# Patient Record
Sex: Male | Born: 1945 | Race: Black or African American | Hispanic: No | Marital: Married | Smoking: Never smoker
Health system: Southern US, Community
[De-identification: ages and names within clinical notes are randomized; demographics above are authoritative.]

## PROBLEM LIST (undated history)

## (undated) DIAGNOSIS — I10 Essential (primary) hypertension: Secondary | ICD-10-CM

## (undated) DIAGNOSIS — G4730 Sleep apnea, unspecified: Secondary | ICD-10-CM

## (undated) DIAGNOSIS — N429 Disorder of prostate, unspecified: Secondary | ICD-10-CM

## (undated) DIAGNOSIS — N200 Calculus of kidney: Secondary | ICD-10-CM

## (undated) DIAGNOSIS — K219 Gastro-esophageal reflux disease without esophagitis: Secondary | ICD-10-CM

## (undated) DIAGNOSIS — I429 Cardiomyopathy, unspecified: Secondary | ICD-10-CM

## (undated) DIAGNOSIS — Z87442 Personal history of urinary calculi: Secondary | ICD-10-CM

## (undated) DIAGNOSIS — R0600 Dyspnea, unspecified: Secondary | ICD-10-CM

## (undated) DIAGNOSIS — N189 Chronic kidney disease, unspecified: Secondary | ICD-10-CM

## (undated) DIAGNOSIS — N400 Enlarged prostate without lower urinary tract symptoms: Secondary | ICD-10-CM

## (undated) DIAGNOSIS — M1990 Unspecified osteoarthritis, unspecified site: Secondary | ICD-10-CM

## (undated) HISTORY — PX: CARDIAC CATHETERIZATION: SHX172

## (undated) HISTORY — PX: KIDNEY STONE SURGERY: SHX686

## (undated) HISTORY — PX: TONSILLECTOMY: SUR1361

## (undated) HISTORY — PX: COLONOSCOPY: SHX174

## (undated) HISTORY — DX: Essential (primary) hypertension: I10

## (undated) HISTORY — DX: Gastro-esophageal reflux disease without esophagitis: K21.9

## (undated) HISTORY — DX: Calculus of kidney: N20.0

## (undated) HISTORY — DX: Sleep apnea, unspecified: G47.30

## (undated) HISTORY — DX: Disorder of prostate, unspecified: N42.9

## (undated) HISTORY — DX: Cardiomyopathy, unspecified: I42.9

---

## 2015-06-10 ENCOUNTER — Encounter (HOSPITAL_COMMUNITY): Payer: Self-pay | Admitting: Physical Medicine and Rehabilitation

## 2015-06-10 ENCOUNTER — Emergency Department (HOSPITAL_COMMUNITY)
Admission: EM | Admit: 2015-06-10 | Discharge: 2015-06-10 | Disposition: A | Discharge status: Home / Self Care | Payer: Medicare Other | Attending: Emergency Medicine | Admitting: Emergency Medicine

## 2015-06-10 ENCOUNTER — Emergency Department (HOSPITAL_COMMUNITY): Payer: Medicare Other

## 2015-06-10 DIAGNOSIS — R7989 Other specified abnormal findings of blood chemistry: Secondary | ICD-10-CM | POA: Insufficient documentation

## 2015-06-10 DIAGNOSIS — Z87442 Personal history of urinary calculi: Secondary | ICD-10-CM | POA: Insufficient documentation

## 2015-06-10 DIAGNOSIS — I10 Essential (primary) hypertension: Secondary | ICD-10-CM | POA: Insufficient documentation

## 2015-06-10 DIAGNOSIS — Z79899 Other long term (current) drug therapy: Secondary | ICD-10-CM | POA: Diagnosis not present

## 2015-06-10 DIAGNOSIS — R945 Abnormal results of liver function studies: Secondary | ICD-10-CM

## 2015-06-10 DIAGNOSIS — R1030 Lower abdominal pain, unspecified: Secondary | ICD-10-CM | POA: Diagnosis present

## 2015-06-10 HISTORY — DX: Cardiomyopathy, unspecified: I42.9

## 2015-06-10 HISTORY — DX: Essential (primary) hypertension: I10

## 2015-06-10 LAB — COMPREHENSIVE METABOLIC PANEL
ALT: 178 U/L — AB (ref 17–63)
AST: 73 U/L — ABNORMAL HIGH (ref 15–41)
Albumin: 3.3 g/dL — ABNORMAL LOW (ref 3.5–5.0)
Alkaline Phosphatase: 46 U/L (ref 38–126)
Anion gap: 9 (ref 5–15)
BUN: 23 mg/dL — ABNORMAL HIGH (ref 6–20)
CALCIUM: 9.2 mg/dL (ref 8.9–10.3)
CO2: 21 mmol/L — ABNORMAL LOW (ref 22–32)
CREATININE: 1.83 mg/dL — AB (ref 0.61–1.24)
Chloride: 105 mmol/L (ref 101–111)
GFR calc non Af Amer: 36 mL/min — ABNORMAL LOW (ref >60)
GFR, EST AFRICAN AMERICAN: 42 mL/min — AB (ref >60)
Glucose, Bld: 91 mg/dL (ref 65–99)
Potassium: 4.8 mmol/L (ref 3.5–5.1)
SODIUM: 135 mmol/L (ref 135–145)
Total Bilirubin: 0.6 mg/dL (ref 0.3–1.2)
Total Protein: 7.3 g/dL (ref 6.5–8.1)

## 2015-06-10 LAB — URINALYSIS, ROUTINE W REFLEX MICROSCOPIC
Bilirubin Urine: NEGATIVE
Glucose, UA: NEGATIVE mg/dL
HGB URINE DIPSTICK: NEGATIVE
KETONES UR: NEGATIVE mg/dL
Leukocytes, UA: NEGATIVE
NITRITE: NEGATIVE
PH: 5.5 (ref 5.0–8.0)
PROTEIN: NEGATIVE mg/dL
Specific Gravity, Urine: 1.016 (ref 1.005–1.030)
Urobilinogen, UA: 0.2 mg/dL (ref 0.0–1.0)

## 2015-06-10 LAB — CBC WITH DIFFERENTIAL/PLATELET
BASOS PCT: 1 % (ref 0–1)
Basophils Absolute: 0 10*3/uL (ref 0.0–0.1)
Eosinophils Absolute: 0.1 10*3/uL (ref 0.0–0.7)
Eosinophils Relative: 2 % (ref 0–5)
HCT: 40 % (ref 39.0–52.0)
HEMOGLOBIN: 14.1 g/dL (ref 13.0–17.0)
Lymphocytes Relative: 22 % (ref 12–46)
Lymphs Abs: 1.6 10*3/uL (ref 0.7–4.0)
MCH: 31 pg (ref 26.0–34.0)
MCHC: 35.3 g/dL (ref 30.0–36.0)
MCV: 87.9 fL (ref 78.0–100.0)
Monocytes Absolute: 0.6 10*3/uL (ref 0.1–1.0)
Monocytes Relative: 8 % (ref 3–12)
NEUTROS PCT: 67 % (ref 43–77)
Neutro Abs: 5 10*3/uL (ref 1.7–7.7)
PLATELETS: 288 10*3/uL (ref 150–400)
RBC: 4.55 MIL/uL (ref 4.22–5.81)
RDW: 14 % (ref 11.5–15.5)
WBC: 7.4 10*3/uL (ref 4.0–10.5)

## 2015-06-10 LAB — POC OCCULT BLOOD, ED: Fecal Occult Bld: NEGATIVE

## 2015-06-10 IMAGING — CT CT ABD-PELV W/O CM
2 of 4 series · 16 of 46 positions shown, 18 images · non-contrast
Comparison: None.

CLINICAL DATA: Right flank pain starting [REDACTED]

EXAM:
CT ABDOMEN AND PELVIS WITHOUT CONTRAST
TECHNIQUE: Multidetector CT imaging of the abdomen and pelvis was performed
following the standard protocol without IV contrast.

[Series 2: abd/ pelvis 5.0 i30f 1 · axial · 0.73mm/px · z∈[+795,+1220]mm · 13 of 95 slices shown, 15 images]
[im 5/95  soft-tissue]
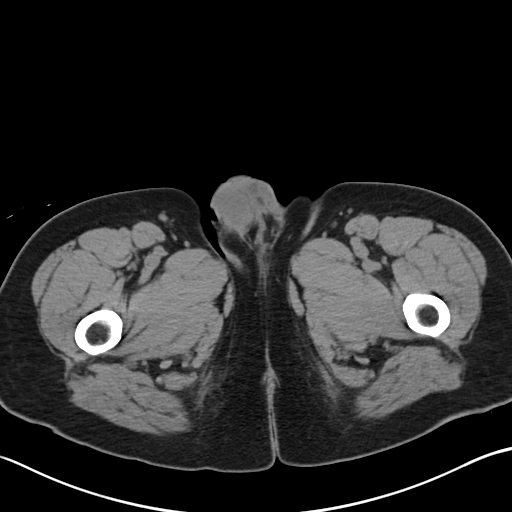
[im 5/95  bone]
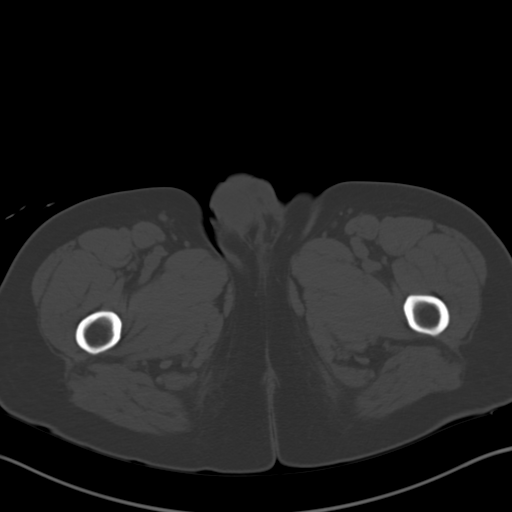
[im 13/95  soft-tissue]
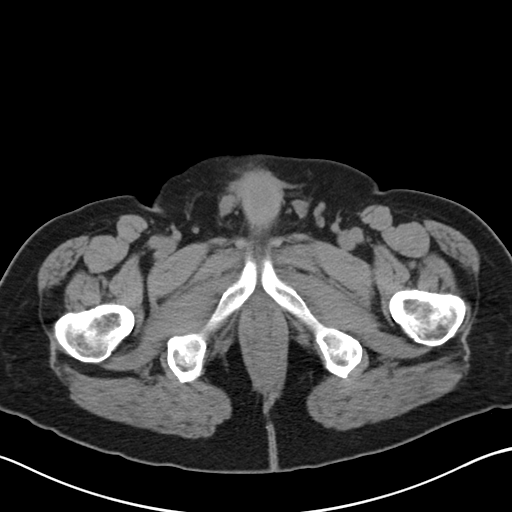
[im 21/95  soft-tissue]
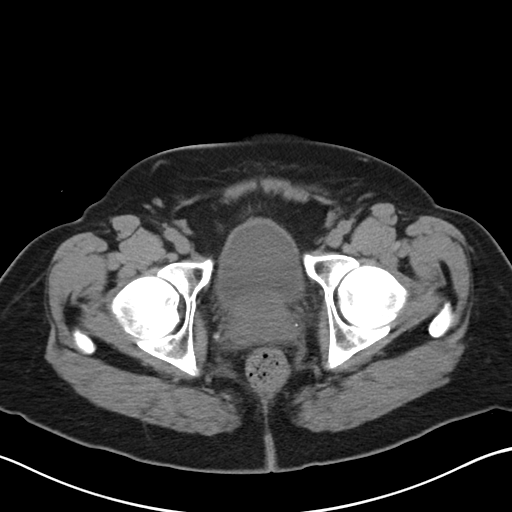
[im 25/95  soft-tissue]
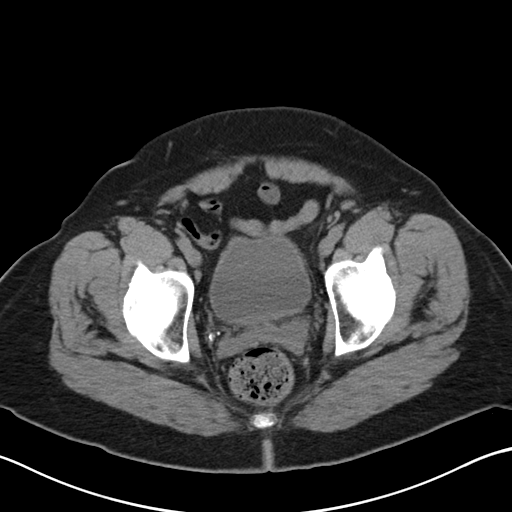
[im 33/95  soft-tissue]
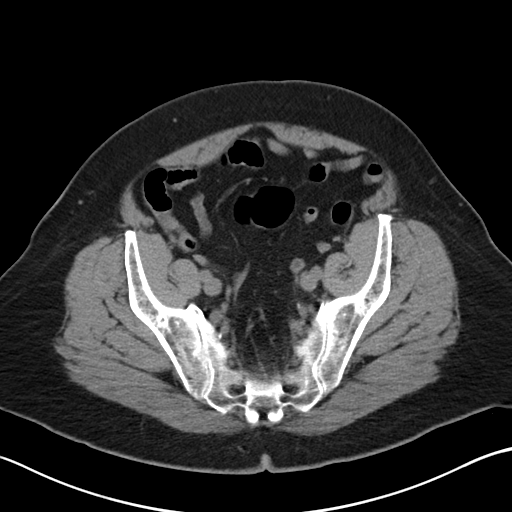
[im 41/95  soft-tissue]
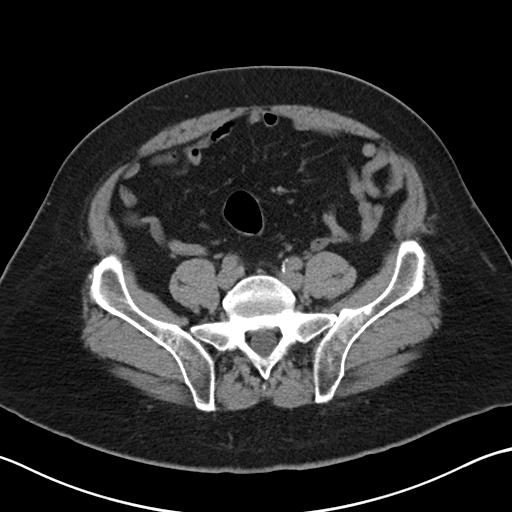
[im 50/95  soft-tissue]
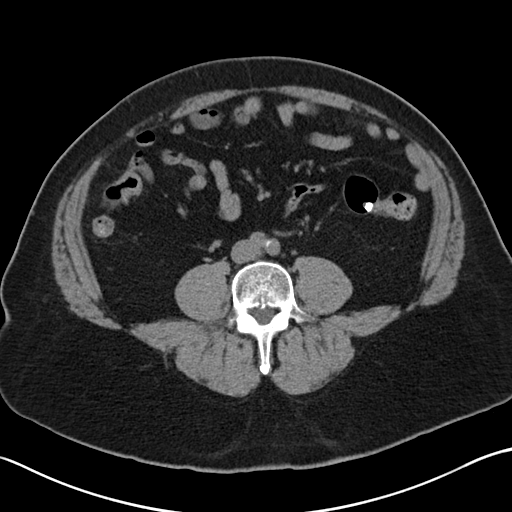
[im 54/95  soft-tissue]
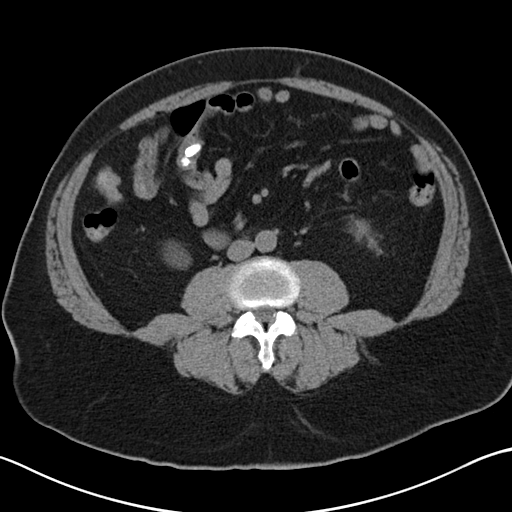
[im 62/95  soft-tissue]
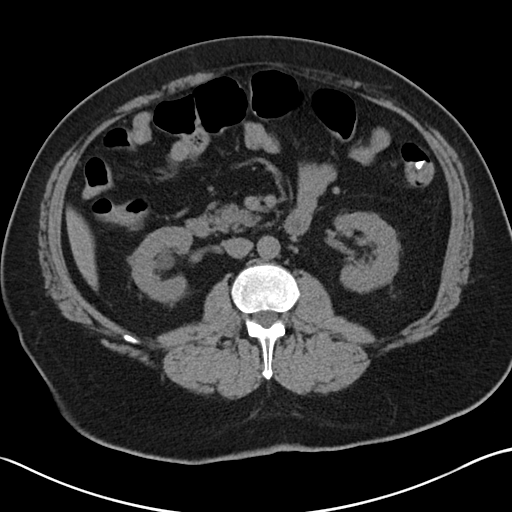
[im 62/95  bone]
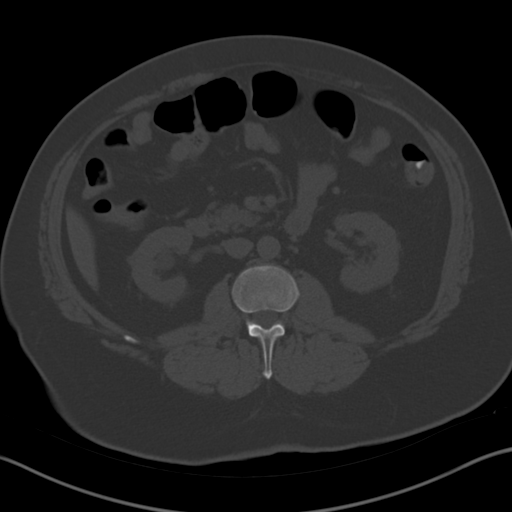
[im 70/95  soft-tissue]
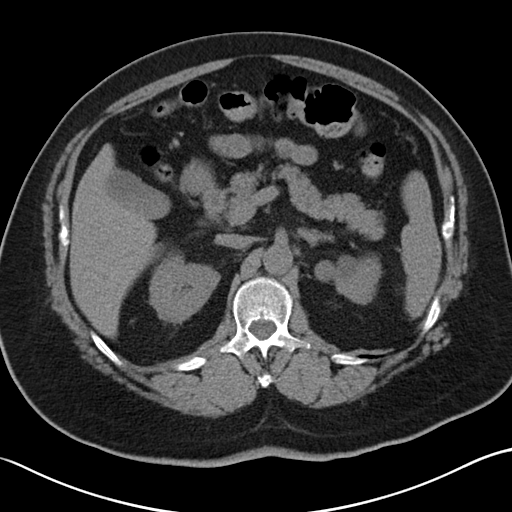
[im 74/95  soft-tissue]
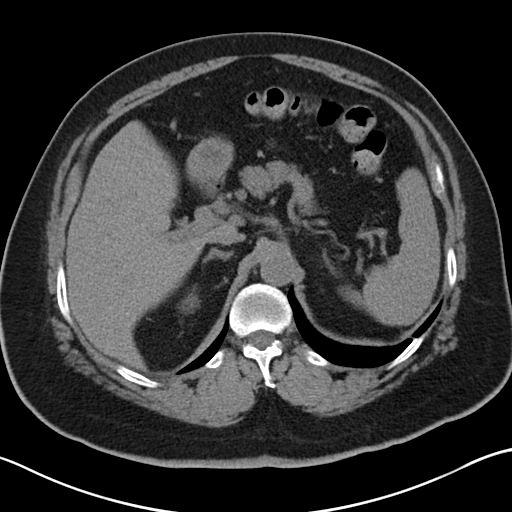
[im 82/95  soft-tissue]
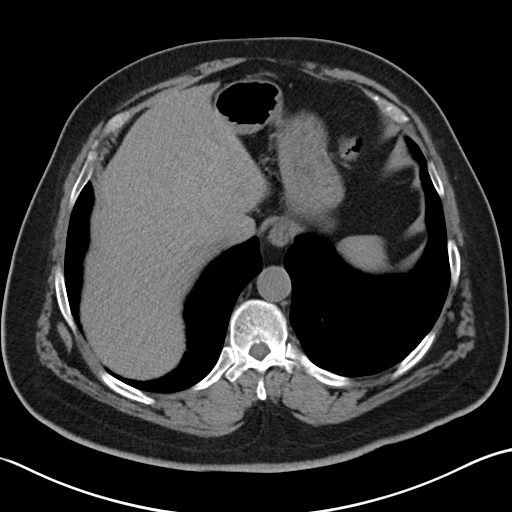
[im 90/95  soft-tissue]
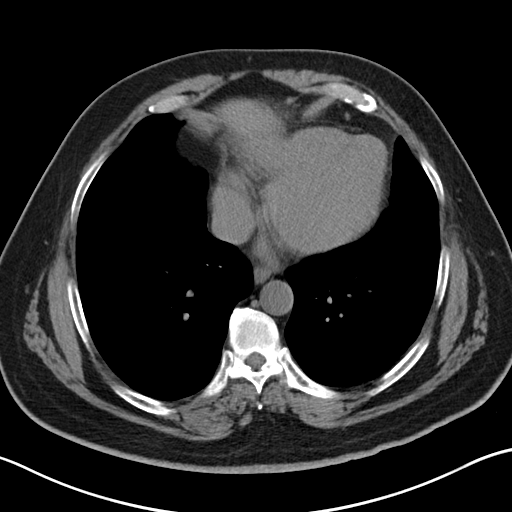

[Series 5: coronal soft tissue · coronal · 0.79mm/px · 3 of 101 slices shown]
[im 34/101  soft-tissue]
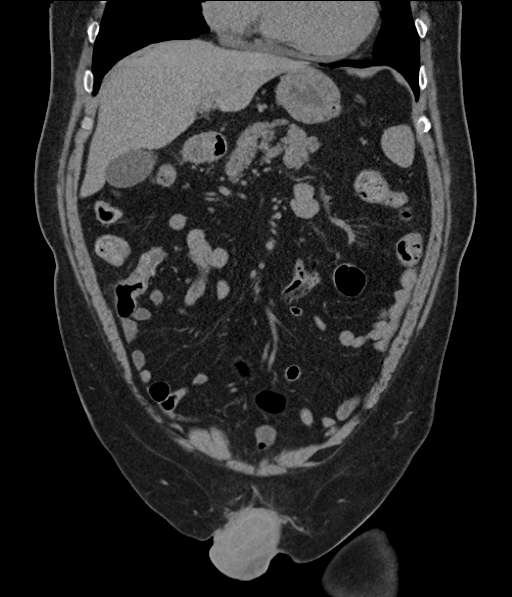
[im 45/101  soft-tissue]
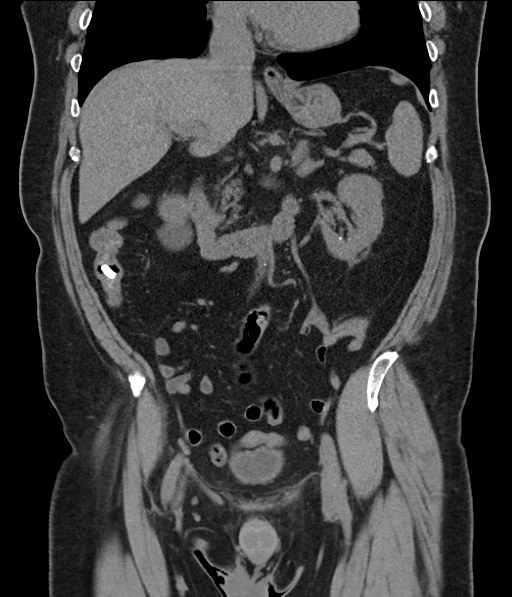
[im 56/101  soft-tissue]
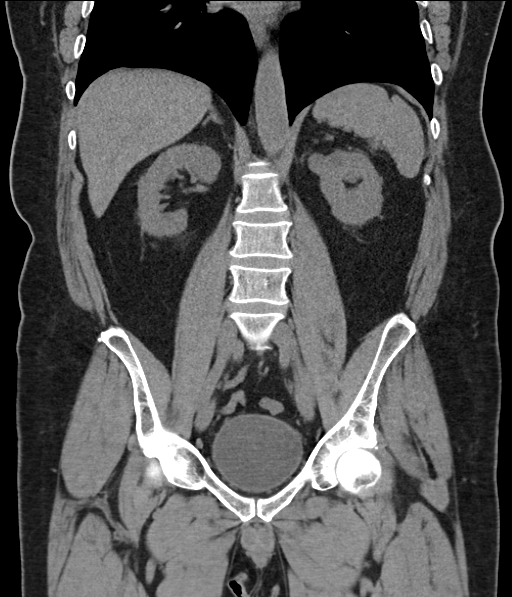

[16 of 46 positions shown; findings below may reference images not displayed]

FINDINGS: Lung bases are unremarkable. Sagittal images of the spine are
unremarkable. Unenhanced liver shows no biliary ductal dilatation.
No calcified gallstones are noted within gallbladder.

Unenhanced pancreas, spleen and adrenal glands are unremarkable.
Cyst in upper pole of the left kidney measures 1.3 cm. Second cyst
in upper pole of the left kidney measures 1.2 cm.

No hydronephrosis or hydroureter. Cyst in lower pole of the right
kidney measures 3.8 cm. Exophytic cyst in upper pole of the left
kidney measures 1.3 cm. Second cyst in upper pole of the left kidney
measures 1.2 cm. No calcified ureteral calculi are noted
bilaterally. No aortic aneurysm. Atherosclerotic calcifications
bilateral common iliac arteries.

Pelvic phleboliths are noted. Mild enlarged prostate gland with
indentation of urinary bladder base. Prostate gland measures 3.7 by
4.3 cm. Pelvic phleboliths are noted. Moderate stool noted within
rectum.

No small bowel obstruction. No ascites or free air. No adenopathy.
There is no pericecal inflammation. The appendix is not identified.
Moderate gas noted within mid sigmoid colon.
IMPRESSION: 1. Bilateral renal cysts are noted. Left nonobstructive
nephrolithiasis.
2. No calcified ureteral calculi are noted bilaterally.
3. There is no pericecal inflammation. The appendix is not
identified.
4. No small bowel obstruction.
5. Mild enlarged prostate gland with indentation of urinary bladder
base.

## 2015-06-10 NOTE — Discharge Instructions (Signed)
Serum Creatinine Test A creatinine test measures the amount of creatinine in your blood serum. Creatinine is a waste product of normal muscle activity (contraction). The kidneys filter creatinine from your blood and excrete it from your body in urine. Blood creatinine levels stay consistent in people whose muscle mass stays consistent. However, this level can be increased in people who perform resistance exercise to increase muscle mass.  The amount of creatinine in your blood serum is measured by a laboratory instrument. A sample of your blood is taken. An instrument causes chemical reactions to produce a color change in your blood sample. The degree of color change is also measured by the instrument. The degree of color change is directly related to the amount of creatinine in the sample. The more creatinine there is in your blood, the deeper the color becomes.  RESULTS It is your responsibility to obtain your test results. Ask the lab or department performing the test when and how you will get your results. Contact your caregiver to discuss any questions you have about your results. Range of Normal Values Ranges for normal values may vary among different labs and hospitals. You should always check with your doctor after having lab work or other tests done to discuss the meaning of your test results and whether your values are considered within normal limits. After adolescence, normal values of serum creatinine are different between females and males because adult males typically have more muscle mass than adult females. The range of values for serum creatinine are listed as follows:  Adult:  Male--0.5-1.1 mg/dL or 78-2 micromole/L (SI units)  Male--0.6-1.2 mg/dL or 95-621 micromole/L (SI units)  Adolescent--0.5-1.0 mg/dL or 44 micromole/L (SI units)  Child--0.3-0.7 mg/dL or 30-86 micromole/L (SI units)  Infant--0.2-0.4 mg/dL or 57-84 micromole/L (SI units)  Newborn--0.3-1.2 mg/dL or 69-629  micromole/L (SI units) The range of normal values may be higher in people who perform resistance exercise to increase muscle mass.  Meaning of Results Outside of Normal Value Ranges Abnormally high serum creatinine levels are most commonly seen in renal failure. When your kidneys are not working, the creatinine is not removed. The levels of creatinine then build up in your blood. Abnormally high serum creatinine levels also can be seen with dehydration, overactive thyroid glands (hyperthyroidism), conditions related to overgrowth of the body (acromegaly or gigantism), abnormal breakdown of muscle tissue (rhabdomyolysis), and early muscular dystrophy. Abnormally low serum creatinine levels may indicate low muscle mass associated with malnutrition, lack of activity, increasing age, or late-stage muscular dystrophy. Abnormally low creatinine values also are seen during the first 6 months of pregnancy. Document Released: 11/10/2004 Document Revised: 10/04/2012 Document Reviewed: 09/04/2012 [facility] Patient Information 2015 [place], [place]. This information is not intended to replace advice given to you by your health care provider. Make sure you discuss any questions you have with your health care provider.

## 2015-06-10 NOTE — ED Notes (Signed)
Pt presents to department for evaluation of R sided groin pain. Ongoing for several days. Also states decreased urine output and hematuria. Pt is alert and oriented x4. States kidney stones in the past.

## 2015-06-10 NOTE — ED Notes (Signed)
Called at triage for room x3. No answer 

## 2015-06-10 NOTE — ED Notes (Signed)
Pt verbalizes understanding of discharge instructions. NAD on departure. A/O x4. VSS. Ambulatory with steady gait.

## 2015-06-10 NOTE — ED Provider Notes (Signed)
CSN: [id number]     Arrival date & time 06/10/15  1257 History   First MD Initiated Contact with Patient 06/10/15 1456     Chief Complaint  Patient presents with  . Groin Pain     (Consider location/radiation/quality/duration/timing/severity/associated sxs/prior Treatment) HPI    PCP: No primary care provider on file. Blood pressure 134/81, pulse 74, temperature 98.1 F (36.7 C), temperature source Oral, resp. rate 16, SpO2 99 %.  Jalani Rominger is a 69 y.o.male with a significant PMH of hypertension, kidney stones, cardiomyopathy presents to the ER with complaints of right sided groin and dar stool.  He has a history of kidney stones x 4 in the past and this feels the same. The pain is right flank and radiates down into his groin. He reports hematuria and decreased urine output for several days now. His wife has been giving him some of her Amoxicillin for 3 days for his symptoms as well as Ibuprofen. The patients primary care doctor is in [place] and he is in [facility]. They are due to travel to [facility] soon and came to the ER to make sure he is okay before they leave.   The patient denies diaphoresis, fever, headache, weakness (general or focal), confusion, change of vision,  neck pain, dysphagia, aphagia, chest pain, shortness of breath,  back pain, nausea, vomiting, diarrhea, lower extremity swelling, rash.   Past Medical History  Diagnosis Date  . Hypertension   . Cardiomyopathy    History reviewed. No pertinent past surgical history. No family history on file. History  Substance Use Topics  . Smoking status: Never Smoker   . Smokeless tobacco: Not on file  . Alcohol Use: No    Review of Systems  10 Systems reviewed and are negative for acute change except as noted in the HPI.   Allergies  Review of patient's allergies indicates no known allergies.  Home Medications   Prior to Admission medications   Medication Sig Start Date End Date Taking?  Authorizing Provider  EDARBI 80 MG TABS Take 1 tablet by mouth every morning. 05/24/15   Historical Provider, MD  NIFEdipine (PROCARDIA-XL/ADALAT CC) 30 MG 24 hr tablet Take 30 mg by mouth daily. 04/15/15   Historical Provider, MD  RANEXA 1000 MG SR tablet TAKE 1 TABLET BY MOUTH TWICE A DAY AFTER MEALS 03/18/15   Historical Provider, MD   BP 132/59 mmHg  Pulse 66  Temp(Src) 98.1 F (36.7 C) (Oral)  Resp 16  SpO2 100% Physical Exam  Constitutional: He appears well-developed and well-nourished. No distress.  HENT:  Head: Normocephalic and atraumatic.  Eyes: Pupils are equal, round, and reactive to light.  Neck: Normal range of motion. Neck supple.  Cardiovascular: Normal rate and regular rhythm.   Pulmonary/Chest: Effort normal.  Abdominal: Soft. Bowel sounds are normal. He exhibits no distension. There is tenderness (mild tenderness to right flank). There is CVA tenderness (right). There is no rigidity, no rebound and no guarding.  Genitourinary:  No testicular pain or swelling.  Prostate does not feel enlarged  No gross blood in rectal vault Small amount of dark stool noted.  Neurological: He is alert.  Skin: Skin is warm and dry.  Nursing note and vitals reviewed.   ED Course  Procedures (including critical care time) Labs Review Labs Reviewed  COMPREHENSIVE METABOLIC PANEL - Abnormal; Notable for the following:    CO2 21 (*)    BUN 23 (*)    Creatinine, Ser 1.83 (*)  Albumin 3.3 (*)    AST 73 (*)    ALT 178 (*)    GFR calc non Af Amer 36 (*)    GFR calc Af Amer 42 (*)    All other components within normal limits  URINALYSIS, ROUTINE W REFLEX MICROSCOPIC (NOT AT [facility])  CBC WITH DIFFERENTIAL/PLATELET  OCCULT BLOOD X 1 CARD TO LAB, STOOL  POC OCCULT BLOOD, ED    Imaging Review Ct Abdomen Pelvis Wo Contrast  06/10/2015   CLINICAL DATA:  Right flank pain starting Wednesday  EXAM: CT ABDOMEN AND PELVIS WITHOUT CONTRAST  TECHNIQUE: Multidetector CT imaging of the abdomen  and pelvis was performed following the standard protocol without IV contrast.  COMPARISON:  None.  FINDINGS: Lung bases are unremarkable. Sagittal images of the spine are unremarkable. Unenhanced liver shows no biliary ductal dilatation. No calcified gallstones are noted within gallbladder.  Unenhanced pancreas, spleen and adrenal glands are unremarkable. Cyst in upper pole of the left kidney measures 1.3 cm. Second cyst in upper pole of the left kidney measures 1.2 cm.  No hydronephrosis or hydroureter. Cyst in lower pole of the right kidney measures 3.8 cm. Exophytic cyst in upper pole of the left kidney measures 1.3 cm. Second cyst in upper pole of the left kidney measures 1.2 cm. No calcified ureteral calculi are noted bilaterally. No aortic aneurysm. Atherosclerotic calcifications bilateral common iliac arteries.  Pelvic phleboliths are noted. Mild enlarged prostate gland with indentation of urinary bladder base. Prostate gland measures 3.7 by 4.3 cm. Pelvic phleboliths are noted. Moderate stool noted within rectum.  No small bowel obstruction. No ascites or free air. No adenopathy. There is no pericecal inflammation. The appendix is not identified. Moderate gas noted within mid sigmoid colon.  IMPRESSION: 1. Bilateral renal cysts are noted. Left nonobstructive nephrolithiasis. 2. No calcified ureteral calculi are noted bilaterally. 3. There is no pericecal inflammation. The appendix is not identified. 4. No small bowel obstruction. 5. Mild enlarged prostate gland with indentation of urinary bladder base.   Electronically Signed   By: Natasha Mead M.D.   On: 06/10/2015 17:27     EKG Interpretation None      MDM   Final diagnoses:  Elevated serum creatinine  Elevated LFTs    Patients liver and kidney function are abnormal but he is from out of town and unsure if this is baseline or not. His wife believes his creatinine is usually 1.8. His CT scan is reassuring and his CBC, urinalysis and hemoccult  are all normal. Dr. Manus Gunning has seen patient as well. We will instruct the patient to be seen within the next few days to have his labs rechecked and to be compared to baseline by his PCP. At this time his discomfort is intermittent and mild- he has not had pain during his stay today and declined pain medication during the visit.  Patient safe for discharge at this time. - bladder scanner showed no urine within bladder.  Medications - No data to display  69 y.o.Desmond Dike evaluation in the Emergency Department is complete. It has been determined that no acute conditions requiring further emergency intervention are present at this time. The patient/guardian have been advised of the diagnosis and plan. We have discussed signs and symptoms that warrant return to the ED, such as changes or worsening in symptoms.  Vital signs are stable at discharge. Filed Vitals:   06/10/15 1630  BP: 132/59  Pulse: 66  Temp:   Resp:  Patient/guardian has voiced understanding and agreed to follow-up with the PCP or specialist.    Marlon Pel, PA-C 06/10/15 1815  Marlon Pel, PA-C 06/10/15 1844  Glynn Octave, MD 06/11/15 [phone]

## 2015-06-10 NOTE — ED Notes (Signed)
PA at bedside.

## 2015-06-10 NOTE — ED Notes (Signed)
Pt transporting to CT NAD.  

## 2016-04-28 ENCOUNTER — Ambulatory Visit: Payer: Medicare Other | Admitting: Anesthesiology

## 2016-04-28 ENCOUNTER — Ambulatory Visit: Payer: Medicare Other | Admitting: Gastroenterology

## 2016-04-28 ENCOUNTER — Ambulatory Visit: Payer: Self-pay

## 2016-04-28 ENCOUNTER — Ambulatory Visit
Admission: RE | Admit: 2016-04-28 | Discharge: 2016-04-28 | Disposition: A | Discharge status: Home / Self Care | Payer: Medicare Other | Source: Ambulatory Visit | Attending: Gastroenterology | Admitting: Gastroenterology

## 2016-04-28 ENCOUNTER — Encounter
Admission: RE | Disposition: A | Discharge status: Home / Self Care | Payer: Self-pay | Source: Ambulatory Visit | Attending: Gastroenterology

## 2016-04-28 DIAGNOSIS — D125 Benign neoplasm of sigmoid colon: Secondary | ICD-10-CM

## 2016-04-28 DIAGNOSIS — K621 Rectal polyp: Secondary | ICD-10-CM

## 2016-04-28 DIAGNOSIS — K635 Polyp of colon: Secondary | ICD-10-CM | POA: Insufficient documentation

## 2016-04-28 DIAGNOSIS — K529 Noninfective gastroenteritis and colitis, unspecified: Secondary | ICD-10-CM | POA: Insufficient documentation

## 2016-04-28 DIAGNOSIS — R938 Abnormal findings on diagnostic imaging of other specified body structures: Secondary | ICD-10-CM | POA: Insufficient documentation

## 2016-04-28 DIAGNOSIS — G4730 Sleep apnea, unspecified: Secondary | ICD-10-CM | POA: Insufficient documentation

## 2016-04-28 DIAGNOSIS — D123 Benign neoplasm of transverse colon: Secondary | ICD-10-CM | POA: Insufficient documentation

## 2016-04-28 DIAGNOSIS — I10 Essential (primary) hypertension: Secondary | ICD-10-CM | POA: Insufficient documentation

## 2016-04-28 DIAGNOSIS — I428 Other cardiomyopathies: Secondary | ICD-10-CM | POA: Insufficient documentation

## 2016-04-28 DIAGNOSIS — K648 Other hemorrhoids: Secondary | ICD-10-CM | POA: Insufficient documentation

## 2016-04-28 DIAGNOSIS — Z86010 Personal history of colonic polyps: Secondary | ICD-10-CM | POA: Insufficient documentation

## 2016-04-28 DIAGNOSIS — K219 Gastro-esophageal reflux disease without esophagitis: Secondary | ICD-10-CM | POA: Insufficient documentation

## 2016-04-28 DIAGNOSIS — D122 Benign neoplasm of ascending colon: Secondary | ICD-10-CM

## 2016-04-28 DIAGNOSIS — R194 Change in bowel habit: Secondary | ICD-10-CM

## 2016-04-28 DIAGNOSIS — K5791 Diverticulosis of intestine, part unspecified, without perforation or abscess with bleeding: Secondary | ICD-10-CM

## 2016-04-28 DIAGNOSIS — K5730 Diverticulosis of large intestine without perforation or abscess without bleeding: Secondary | ICD-10-CM | POA: Insufficient documentation

## 2016-04-28 DIAGNOSIS — D124 Benign neoplasm of descending colon: Secondary | ICD-10-CM

## 2016-04-28 SURGERY — DONT USE, USE 1094-COLONOSCOPY, DIAGNOSTIC (SCREENING)
Anesthesia: Anesthesia General | Site: Abdomen | Wound class: Clean Contaminated

## 2016-04-28 MED ORDER — PROPOFOL INFUSION 10 MG/ML
INTRAVENOUS | Status: DC | PRN
Start: 2016-04-28 — End: 2016-04-28
  Administered 2016-04-28: 50 mg via INTRAVENOUS
  Administered 2016-04-28: 80 mg via INTRAVENOUS
  Administered 2016-04-28: 20 mg via INTRAVENOUS

## 2016-04-28 MED ORDER — LACTATED RINGERS IV SOLN
INTRAVENOUS | Status: DC | PRN
Start: 2016-04-28 — End: 2016-04-28

## 2016-04-28 MED ORDER — LIDOCAINE HCL (PF) 2 % IJ SOLN
INTRAMUSCULAR | Status: AC
Start: 2016-04-28 — End: ?
  Filled 2016-04-28: qty 5

## 2016-04-28 MED ORDER — ONDANSETRON HCL 4 MG/2ML IJ SOLN
INTRAMUSCULAR | Status: AC
Start: 2016-04-28 — End: ?
  Filled 2016-04-28: qty 2

## 2016-04-28 MED ORDER — EPHEDRINE SULFATE 50 MG/ML IJ SOLN
INTRAMUSCULAR | Status: DC | PRN
Start: 2016-04-28 — End: 2016-04-28
  Administered 2016-04-28: 5 mg via INTRAVENOUS

## 2016-04-28 MED ORDER — ONDANSETRON HCL 4 MG/2ML IJ SOLN
INTRAMUSCULAR | Status: DC | PRN
Start: 2016-04-28 — End: 2016-04-28
  Administered 2016-04-28: 4 mg via INTRAVENOUS

## 2016-04-28 MED ORDER — PROPOFOL 10 MG/ML IV EMUL (WRAP)
INTRAVENOUS | Status: AC
Start: 2016-04-28 — End: ?
  Filled 2016-04-28: qty 40

## 2016-04-28 MED ORDER — PROPOFOL INFUSION 10 MG/ML
INTRAVENOUS | Status: DC | PRN
Start: 2016-04-28 — End: 2016-04-28
  Administered 2016-04-28: 200 ug/kg/min via INTRAVENOUS

## 2016-04-28 MED ORDER — LIDOCAINE HCL 2 % IJ SOLN
INTRAMUSCULAR | Status: DC | PRN
Start: 2016-04-28 — End: 2016-04-28
  Administered 2016-04-28: 100 mg

## 2016-04-28 SURGICAL SUPPLY — 21 items
CANISTER 1000CC (Procedure Accessories) ×2 IMPLANT
FORCEPS BIOPSY L240 CM +2.8 MM HOT OD2.2 (Endoscopic Supplies) ×1
FORCEPS BIOPSY L240 CM +2.8 MM HOT OD2.2 MM RADIAL JAW (Endoscopic Supplies) IMPLANT
FORCEPS BX +2.8MM RJ 4 2.2MM 240CM HOT (Endoscopic Supplies) ×1
GOWN ISO YELLOW UNIVERSAL (Gown) ×4 IMPLANT
KIT UNIVERSAL IRRIGATION SOL (Kits) ×2 IMPLANT
PAD ELECTROSRG GRND REM W CRD (Procedure Accessories) ×1 IMPLANT
SNARE THROW SENS SHRT STD OVA (Endoscopic Supplies) ×1 IMPLANT
SOFT-CUF 2T ADULT SUB-MIN (Cuff) ×2 IMPLANT
SOLN LUBRICATING JELLY 4.25OZ (Irrigation Solutions) ×2 IMPLANT
SPONGE GAUZE L4 IN X W4 IN 16 PLY (Dressing) ×10
SPONGE GAUZE L4 IN X W4 IN 16 PLY MAXIMUM ABSORBENT USP TYPE VII (Dressing) ×10 IMPLANT
SPONGE GZE CTTN CRTY 4X4IN LF NS 16 PLY (Dressing) ×10
SYRINGE 50 ML GRADUATE NONPYROGENIC DEHP (Syringes, Needles) ×1
SYRINGE 50 ML GRADUATE NONPYROGENIC DEHP FREE PVC FREE BD MEDICAL (Syringes, Needles) ×1 IMPLANT
SYRINGE MED 50ML LF STRL GRAD N-PYRG (Syringes, Needles) ×1
WATER STERILE PLASTIC POUR BOTTLE 1000 (Irrigation Solutions) ×1
WATER STERILE PLASTIC POUR BOTTLE 1000 ML (Irrigation Solutions) ×1 IMPLANT
WATER STERILE PLASTIC POUR BOTTLE 250 ML (Irrigation Solutions) ×1 IMPLANT
WATER STRL 1000ML PLS PR BTL LF (Irrigation Solutions) ×1
WATER STRL 250ML LF PLS PR BTL (Irrigation Solutions) ×1

## 2016-04-28 NOTE — Anesthesia Postprocedure Evaluation (Signed)
Anesthesia Post Evaluation    Patient: Christian Harding    Procedures performed: Procedure(s):  COLONOSCOPY    Anesthesia type: General TIVA    Patient location:PACU    Last vitals:   Filed Vitals:    04/28/16 1115   BP: 136/62   Pulse: 60   Temp:    Resp: 20   SpO2: 97%       Post pain: Patient not complaining of pain, continue current therapy      Mental Status:awake    Respiratory Function: tolerating room air    Cardiovascular: stable    Nausea/Vomiting: patient not complaining of nausea or vomiting    Hydration Status: adequate    Post assessment: no apparent anesthetic complications    Manuella Ghazi , 04/28/2016 2:18 PM

## 2016-04-28 NOTE — Discharge Instructions (Addendum)
Diverticulosis    Diverticulosismeans that small pouches have formed in the wall ofyourlarge intestine (colon). Most often, this problem causes no symptoms and is common as people age. But the pouches in the colon are at risk of becoming infected. When this happens, the condition is called diverticulitis. Although most people with diverticulosis never develop diverticulitis, it is still not uncommon. Rectal bleeding can also occur and in less common situations, a type of colon inflammation called colitis.  While most people do nothave symptoms, some people with diverticulosis mayhave:   Abdominal cramps and pain   Bloating   Constipation   Change in bowel habits  Causes  The exact cause of diverticulosis (and diverticulitis) has not been proved, buta few things are associated with the condition:   Low-fiber diet   Constipation   Lack of exercise  Your healthcare provider will talk with you about how to manage your condition. Diet changes may be all that are needed to help control diverticulosis and prevent progression to diverticulitis. If you develop diverticulitis, you will likely needother treatments.  Home care  You may be told to take fiber supplements daily. Fiber adds bulk to the stool so that it passes through the colon more easily. Stool softeners may be recommended. You may also be given medications for pain relief. Be sure to take all medications as directed.  In the past, people were told to avoid corn, nuts, and seeds. This is no longer necessary.  Follow these guidelines when caring for yourself at home:   Eat unprocessed foods that are high in fiber. Whole grains, fruits, and vegetables are good choices.   Drink 6 to 8 glasses of water every day unless your healthcare provider has you limit how muchfluid you should have.   Watch for changes in your bowel movements. Tell your provider if you notice any changes.   Begin an exercise program. Ask your provider how to get started.  Generally, walking is the best.   Get plenty of rest and sleep.  Follow-up care  Follow up with your healthcare provider, oras advised. Regular visits may be needed to check on your health. Sometimes special procedures such as colonoscopy, are needed after an episode of diverticulitis or blooding. Be sure to keep all your appointments.  If a stool sample was taken, or cultures were done, you should be told if they are positive, or if your treatment needs to be changed. You can call as directed for the results.  If X-rays were done,a radiologist will look at them. You will be told if there is a change in your treatment.  If antibiotics were prescribed, be sure to finish them all.  When to seek medical advice  Call your healthcare provider right awayif any of these occur:   Fever of 100.4F (38C) or higher, or as directed by your healthcare provider   Severe cramps in the lower left side of the abdomen or pain that is gettingworse   Tenderness in the lower left side of the abdomen or worsening pain throughout the abdomen   Diarrhea or constipation that doesn't get better within 24 hours   Nausea and vomiting   Bleeding from the rectum  Call 911  Call emergency services if any of the following occur:   Trouble breathing   Confusion   Very drowsy or trouble awakening   Fainting or loss of consciousness   Rapid heart rate   Chest pain  Date Last Reviewed: 10/30/2014   2000-2016 [employer] [address], [place] [address]. All rights reserved. This information is not intended as a substitute for professional medical care. Always follow your healthcare professional's instructions.

## 2016-04-28 NOTE — Transfer of Care (Addendum)
Anesthesia Transfer of Care Note    Patient: Christian Harding    Procedures performed: Procedure(s):  COLONOSCOPY    Anesthesia type: General TIVA    Patient location:endo    Last vitals:   Filed Vitals:    04/28/16 1042   BP: 95/50   Pulse: 68   Temp: 36.6 C (97.9 F)   Resp: 15   SpO2: 99%       Post pain: Patient not complaining of pain, continue current therapy      Mental Status:responds to stimulation    Respiratory Function: tolerating nasal cannula    Cardiovascular: stable    Nausea/Vomiting: patient not complaining of nausea or vomiting    Hydration Status: adequate    Post assessment: no apparent anesthetic complications, no reportable events and no evidence of recall     Report to rn, vss      Signed by: Ulla Potash  04/28/2016 10:43 AM

## 2016-04-28 NOTE — Anesthesia Preprocedure Evaluation (Signed)
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

## 2016-04-28 NOTE — H&P (Signed)
GI PRE PROCEDURE NOTE    Proceduralist Comments:   Review of Systems and Past Medical / Surgical History performed: Yes     Indications:Colon cancer screening, History of colonic polyps, Change in bowel habits and Chronic diarrhea abnormal ct scan    Date of Last Colonoscopy: 2014    Previous Adverse Reaction to Anesthesia or Sedation (if yes, describe): No    Physical Exam / Laboratory Data (If applicable)   Airway Classification: As per the anesthesiologist    General: Alert and cooperative  Lungs: Lungs clear to auscultation  Cardiac: RRR, normal S1S2.    Abdomen: Soft, non tender. Normal active bowel sounds  Other:        Scheduled Meds: PRN Meds:        Continuous Infusions:           Home Medications:     No prescriptions prior to admission       Planned Sedation:   Deep sedation with anesthesia    Attestation:   Christian Harding has been reassessed immediately prior to the procedure and is an appropriate candidate for the planned sedation and procedure. Risks, benefits and alternatives to the planned procedure and sedation have been explained to the patient or guardian:  yes        Signed by: Jossie Ng.D.

## 2016-04-29 LAB — LAB USE ONLY - HISTORICAL SURGICAL PATHOLOGY

## 2016-05-03 ENCOUNTER — Encounter: Payer: Self-pay | Admitting: Gastroenterology

## 2018-09-26 ENCOUNTER — Encounter (HOSPITAL_COMMUNITY): Payer: Self-pay | Admitting: *Deleted

## 2018-09-26 ENCOUNTER — Other Ambulatory Visit: Payer: Self-pay

## 2018-09-26 NOTE — Progress Notes (Signed)
Have requested last office visit note, EKG, and any cardiac studies from Dr. Bobbe MedicoGooray's office.

## 2018-09-26 NOTE — Progress Notes (Signed)
Spoke with pt for pre-op call. Pt has hx of Hypertropic cardiomyopathy. Pt's cardiologist is Dr. Royce [place]adamia[place]id [place] States he saw him this summer and no changes were made. Pt states he had a cath "years ago and there were no blockages". Pt's last dose of Aspirin was 09/25/18. Pt states he is not a diabetic.

## 2018-09-27 ENCOUNTER — Encounter (HOSPITAL_COMMUNITY): Payer: Self-pay | Admitting: Vascular Surgery

## 2018-09-27 NOTE — Progress Notes (Signed)
error 

## 2018-09-27 NOTE — Anesthesia Preprocedure Evaluation (Addendum)
Anesthesia Evaluation  Patient identified by MRN, date of birth, ID band Patient awake    Reviewed: Allergy & Precautions, NPO status , Patient's Chart, lab work & pertinent test results  Airway Mallampati: III  TM Distance: >3 FB Neck ROM: Full    Dental no notable dental hx. (+) Teeth Intact, Dental Advisory Given   Pulmonary sleep apnea and Continuous Positive Airway Pressure Ventilation ,    Pulmonary exam normal breath sounds clear to auscultation       Cardiovascular hypertension, Pt. on medications Normal cardiovascular exam Rhythm:Regular Rate:Normal  Cardiomyopathy   Dr. Wyona AlmasGooray reports he last evaluated pt about 6 months ago, and that he had spoken to pt about current CV status and there has been no change since last eval.  Dr. Wyona AlmasGooray feels pt is able to proceed with surgery as scheduled  EKG 04/14/18 (Dr. Bobbe MedicoGooray's office): Sinus rhythm.  Multiple PACs.  First-degree AV block.  RBBB with repolarization changes.  Consider LA enlargement.  Probable lateral infarct.  Poor R wave progression.  Echo 04/14/18 (Dr. Bobbe MedicoGooray's office; results copied from office visit note):  1.  Normal LV cavity size, moderate concentric LVH, with asymmetric septal hypertrophy, with sigmoid basal septum.  Normal LV systolic function, EF 65-70%.  No wall motion abnormalities. 2.  Mild late MR, trileaflet, mildly calcific AV, without stenoses or AI. 3.  No pulmonary hypertension    Neuro/Psych negative neurological ROS  negative psych ROS   GI/Hepatic Neg liver ROS, GERD  Medicated and Controlled,  Endo/Other  negative endocrine ROS  Renal/GU Renal disease     Musculoskeletal  (+) Arthritis , Osteoarthritis,    Abdominal   Peds  Hematology negative hematology ROS (+)   Anesthesia Other Findings Left ulna olecrannon fracture  Reproductive/Obstetrics                          Anesthesia Physical Anesthesia  Plan  ASA: III  Anesthesia Plan: General   Post-op Pain Management:    Induction:   PONV Risk Score and Plan: 2 and Ondansetron, Dexamethasone and Treatment may vary due to age or medical condition  Airway Management Planned: Oral ETT  Additional Equipment:   Intra-op Plan:   Post-operative Plan: Extubation in OR  Informed Consent: I have reviewed the patients History and Physical, chart, labs and discussed the procedure including the risks, benefits and alternatives for the proposed anesthesia with the patient or authorized representative who has indicated his/her understanding and acceptance.   Dental advisory given  Plan Discussed with: CRNA, Anesthesiologist and Surgeon  Anesthesia Plan Comments: (Reviewed PAT note started by Shonna ChockAllison Zelenak, PA-C. Rica MastAngela Kabbe, FNP-C to follow-up 09/29/18. )   Anesthesia Quick Evaluation

## 2018-09-27 NOTE — Progress Notes (Addendum)
Anesthesia Chart Review: Maury DusSAME DAY WORK-UP   Case:  [id number] Date/Time:  09/30/18 0715   Procedure:  Open reduction internal fixation left elbow fracture with repair reconstruction and possible allograft as necessary (Left ) - 90 mins   Anesthesia type:  General   Pre-op diagnosis:  Left ulna olecrannon fracture   Location:  [facility] ROOM 03 / [facility]   Surgeon:  Dominica SeverinGramig, William, MD      Addendum:  I spoke with Dr. Royce Macadamiaavid Gooray, pt's cardiologist of 20 years.  Dr. Wyona AlmasGooray reports he last evaluated pt about 6 months ago, and that he had spoken to pt about current CV status and there has been no change since last eval.  Dr. Wyona AlmasGooray feels pt is able to proceed with surgery as scheduled.   Per Dr. Wyona AlmasGooray, pt has a long hx of obstructive hypertrophic cardiomyopathy, no hx CAD, no arrhythmias.  Per Dr. Wyona AlmasGooray, most recent EKG showed 1st degree AV block, RBBB, occasional PACs/PVCs and "EKG has been stable for 10 years."  Pt did have dizziness 6 months ago that Dr. Wyona AlmasGooray attributed to dehydration, none since.  Dr. Wyona AlmasGooray reports pt has "vasoreactive coronaries" and might experience "demand ischemia" intraoperatively due to underlying hypertrophic cardiomyopathy.  Dr. Wyona AlmasGooray recommends we "make sure Mr. Mayford KnifeWilliams does not get volume depleted" perioperatively but had no other recommendations.    Should any care team member wish to consult with Dr. Wyona AlmasGooray, his cell phone # is: [phone].  He is aware of pt's surgery date and time.    Last office visit note, including EKG (and echo results in body of note) with Dr. Wyona AlmasGooray 04/14/18 is on paper chart.   EKG 04/14/18 (Dr. Bobbe MedicoGooray's office): Sinus rhythm.  Multiple PACs.  First-degree AV block.  RBBB with repolarization changes.  Consider LA enlargement.  Probable lateral infarct.  Poor R wave progression.  Echo 04/14/18 (Dr. Bobbe MedicoGooray's office; results copied from office visit note):  1.  Normal LV cavity size, moderate concentric LVH, with asymmetric septal  hypertrophy, with sigmoid basal septum.  Normal LV systolic function, EF 65-70%.  No wall motion abnormalities. 2.  Mild late MR, trileaflet, mildly calcific AV, without stenoses or AI. 3.  No pulmonary hypertension   Reviewed case with Dr. Noreene LarssonJoslin. If no changes, I anticipate pt can proceed with surgery as scheduled.   Rica Mastngela Lolly Glaus, FNP-BC [facility] Phone: [phone] 09/29/2018 3:48 PM     DISCUSSION: Patient is a 72 year old male scheduled for the above procedure. He reports he fell trying to chase after a shopping cart that was rolling towards another car.   History includes never smoker, HTN, hypertrophic cardiomyopathy, exertional dyspnea, OSA (CPAP), GERD, CKD.   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 He was diagnosed with hypertrophic cardiomyopathy (believed to be genetic etiology) around age 72. He reported normal coronaries at that time. He denied history of PPM or ICD. He has been seeing cardiologist Royce MacadamiaGooray, David, [place] in [place] since his initial diagnosis, reportedly last seen this summer with no new changes. He does not know his EF. He had an episode of what sounds like SVT resolved with carotid massage ~ 7-8 years ago and felt hot and lightheaded during a church service ~ 3-4 years ago, but not recent syncope/presyncope or known arrhythmias. He uses a self propelled push lawn mower and depending on the heat and humidity may have to stop and rest every 15-30 minutes, but he also has an indoor  stationary bike that he is able to ride on a regular basis for 30 minutes at a time. He denied issues with edema or fluid overload. He is able to lie flat, but typically sleeps with head up some with CPAP use.      Unfortunately with the upcoming Thanksgiving holiday I was unable to reach patient's cardiologist (the office appears to be closed) to get more specifics about his cardiac status. Reviewed with anesthesiologist Leslye Peer, MD. We will need more specifics about his cardiac history prior to surgery including summary of recent cardiac studies (with by reports or per communication with his cardiologist). Patient has communicated with Dr. Wyona Almas (patient texted Dr. Wyona Almas) about calling anesthesia APP or faxing information to our department fax. Patient understands that if records not received then surgery will need to be postponed. Dr. Amanda Pea aware as well. Rica Mast, NP to follow-up on 09/29/18 and update Dr. Amanda Pea about any additional cardiology input. Patient will need to be updated if surgery needs to be postponed.   PROVIDERS: Majel Homer, MD is PCP [facility] Everywhere). First established 02/10/17. She is aware of surgery plans. (I attempted to get more information from her office about his cardiac status, but they had never received cardiology records.) - Royce Macadamia, MD is cardiologist in [place]. I believe the office number is [phone]. - He sees a nephrologist in [place] (Dr. Martha Clan Raji ?).   LABS: Last labs in [facility] show normal H/H 13.7/40.1, PLT 252, BUN 22, Cr 1.83, and normal LFTs and TSH on 05/02/18. Creatinine on 02/10/17 was 2.16.     EKG: For day of surgery.    CV: Attempting to obtain. See Discussion.   Past Medical History:  Diagnosis Date  . Arthritis   . Cardiomyopathy (HCC)   . CKD (chronic kidney disease)   . Dyspnea    with exertion  . Enlarged prostate   . GERD (gastroesophageal reflux disease)   . History of kidney stones   . Hypertension   . Sleep apnea    uses cpap    Past Surgical History:  Procedure Laterality Date  . CARDIAC CATHETERIZATION     "years ago" - states it was clear  . COLONOSCOPY    . KIDNEY STONE SURGERY    . TONSILLECTOMY      MEDICATIONS: No current facility-administered medications for this encounter.    Marland Kitchen acetaminophen (TYLENOL) 500 MG tablet  . aspirin EC 81 MG tablet  .  HYDROcodone-acetaminophen (NORCO/VICODIN) 5-325 MG tablet  . irbesartan (AVAPRO) 300 MG tablet  . lansoprazole (PREVACID) 30 MG capsule  . latanoprost (XALATAN) 0.005 % ophthalmic solution  . Multiple Vitamin (MULTIVITAMIN WITH MINERALS) TABS tablet  . RANEXA 1000 MG SR tablet  . sodium chloride (OCEAN) 0.65 % SOLN nasal spray  . tamsulosin (FLOMAX) 0.4 MG CAPS capsule    Velna Ochs [facility]/Anesthesiology Phone [phone] 09/27/2018 6:21 PM

## 2018-09-30 ENCOUNTER — Encounter (HOSPITAL_COMMUNITY): Payer: Self-pay | Admitting: Certified Registered Nurse Anesthetist

## 2018-09-30 ENCOUNTER — Other Ambulatory Visit: Payer: Self-pay

## 2018-09-30 ENCOUNTER — Ambulatory Visit (HOSPITAL_COMMUNITY): Payer: Medicare Other | Admitting: Physician Assistant

## 2018-09-30 ENCOUNTER — Encounter (HOSPITAL_COMMUNITY)
Admission: RE | Disposition: A | Discharge status: Home / Self Care | Payer: Self-pay | Source: Ambulatory Visit | Attending: Orthopedic Surgery

## 2018-09-30 ENCOUNTER — Observation Stay (HOSPITAL_COMMUNITY)
Admission: RE | Admit: 2018-09-30 | Discharge: 2018-10-01 | Disposition: A | Discharge status: Home / Self Care | Payer: Medicare Other | Source: Ambulatory Visit | Attending: Orthopedic Surgery | Admitting: Orthopedic Surgery

## 2018-09-30 DIAGNOSIS — S52032A Displaced fracture of olecranon process with intraarticular extension of left ulna, initial encounter for closed fracture: Secondary | ICD-10-CM | POA: Diagnosis present

## 2018-09-30 DIAGNOSIS — I129 Hypertensive chronic kidney disease with stage 1 through stage 4 chronic kidney disease, or unspecified chronic kidney disease: Secondary | ICD-10-CM | POA: Diagnosis not present

## 2018-09-30 DIAGNOSIS — N400 Enlarged prostate without lower urinary tract symptoms: Secondary | ICD-10-CM | POA: Insufficient documentation

## 2018-09-30 DIAGNOSIS — K219 Gastro-esophageal reflux disease without esophagitis: Secondary | ICD-10-CM | POA: Insufficient documentation

## 2018-09-30 DIAGNOSIS — Z7982 Long term (current) use of aspirin: Secondary | ICD-10-CM | POA: Diagnosis not present

## 2018-09-30 DIAGNOSIS — M1990 Unspecified osteoarthritis, unspecified site: Secondary | ICD-10-CM | POA: Insufficient documentation

## 2018-09-30 DIAGNOSIS — W1839XA Other fall on same level, initial encounter: Secondary | ICD-10-CM | POA: Insufficient documentation

## 2018-09-30 DIAGNOSIS — Z79899 Other long term (current) drug therapy: Secondary | ICD-10-CM | POA: Insufficient documentation

## 2018-09-30 DIAGNOSIS — G4730 Sleep apnea, unspecified: Secondary | ICD-10-CM | POA: Diagnosis not present

## 2018-09-30 DIAGNOSIS — I421 Obstructive hypertrophic cardiomyopathy: Secondary | ICD-10-CM | POA: Insufficient documentation

## 2018-09-30 DIAGNOSIS — S52022A Displaced fracture of olecranon process without intraarticular extension of left ulna, initial encounter for closed fracture: Secondary | ICD-10-CM | POA: Diagnosis present

## 2018-09-30 DIAGNOSIS — N189 Chronic kidney disease, unspecified: Secondary | ICD-10-CM | POA: Insufficient documentation

## 2018-09-30 HISTORY — DX: Sleep apnea, unspecified: G47.30

## 2018-09-30 HISTORY — DX: Personal history of urinary calculi: Z87.442

## 2018-09-30 HISTORY — DX: Benign prostatic hyperplasia without lower urinary tract symptoms: N40.0

## 2018-09-30 HISTORY — DX: Chronic kidney disease, unspecified: N18.9

## 2018-09-30 HISTORY — DX: Gastro-esophageal reflux disease without esophagitis: K21.9

## 2018-09-30 HISTORY — DX: Dyspnea, unspecified: R06.00

## 2018-09-30 HISTORY — PX: ORIF ELBOW FRACTURE: SHX5031

## 2018-09-30 HISTORY — DX: Unspecified osteoarthritis, unspecified site: M19.90

## 2018-09-30 LAB — HEMOGLOBIN: Hemoglobin: 14.1 g/dL (ref 13.0–17.0)

## 2018-09-30 LAB — BASIC METABOLIC PANEL
Anion gap: 13 (ref 5–15)
BUN: 34 mg/dL — ABNORMAL HIGH (ref 8–23)
CALCIUM: 9.1 mg/dL (ref 8.9–10.3)
CO2: 16 mmol/L — AB (ref 22–32)
Chloride: 106 mmol/L (ref 98–111)
Creatinine, Ser: 2.36 mg/dL — ABNORMAL HIGH (ref 0.61–1.24)
GFR calc non Af Amer: 26 mL/min — ABNORMAL LOW (ref >60)
GFR, EST AFRICAN AMERICAN: 31 mL/min — AB (ref >60)
Glucose, Bld: 107 mg/dL — ABNORMAL HIGH (ref 70–99)
Potassium: 4.9 mmol/L (ref 3.5–5.1)
Sodium: 135 mmol/L (ref 135–145)

## 2018-09-30 SURGERY — OPEN REDUCTION INTERNAL FIXATION (ORIF) ELBOW/OLECRANON FRACTURE
Anesthesia: General | Site: Elbow | Laterality: Left

## 2018-09-30 MED ORDER — METHOCARBAMOL 500 MG PO TABS
ORAL_TABLET | ORAL | Status: AC
  Administered 2018-09-30: 11:00:00
  Filled 2018-09-30: qty 1

## 2018-09-30 MED ORDER — ONDANSETRON HCL 4 MG PO TABS
4.0000 mg | ORAL_TABLET | Freq: Four times a day (QID) | ORAL | Status: DC | PRN
  Administered 2018-09-30: 4 mg via ORAL
  Filled 2018-09-30: qty 1

## 2018-09-30 MED ORDER — RANOLAZINE ER 500 MG PO TB12
1000.0000 mg | ORAL_TABLET | Freq: Every day | ORAL | Status: DC
  Administered 2018-10-01: 1000 mg via ORAL
  Filled 2018-09-30: qty 2

## 2018-09-30 MED ORDER — ACETAMINOPHEN 10 MG/ML IV SOLN
1000.0000 mg | Freq: Once | INTRAVENOUS | Status: DC | PRN
  Administered 2018-09-30: 1000 mg via INTRAVENOUS

## 2018-09-30 MED ORDER — HYDROMORPHONE HCL 1 MG/ML IJ SOLN: 0.5000 mg | INTRAMUSCULAR | Status: DC | PRN

## 2018-09-30 MED ORDER — ONDANSETRON HCL 4 MG/2ML IJ SOLN: 4.0000 mg | Freq: Once | INTRAMUSCULAR | Status: DC | PRN

## 2018-09-30 MED ORDER — SUCCINYLCHOLINE CHLORIDE 200 MG/10ML IV SOSY
PREFILLED_SYRINGE | INTRAVENOUS | Status: AC
  Filled 2018-09-30: qty 10

## 2018-09-30 MED ORDER — METHOCARBAMOL 500 MG PO TABS
500.0000 mg | ORAL_TABLET | Freq: Four times a day (QID) | ORAL | Status: DC | PRN
  Administered 2018-09-30 (×2): 500 mg via ORAL
  Filled 2018-09-30: qty 1

## 2018-09-30 MED ORDER — LATANOPROST 0.005 % OP SOLN
1.0000 [drp] | Freq: Every day | OPHTHALMIC | Status: DC
  Administered 2018-09-30: 1 [drp] via OPHTHALMIC
  Filled 2018-09-30: qty 2.5

## 2018-09-30 MED ORDER — ROCURONIUM BROMIDE 50 MG/5ML IV SOSY
PREFILLED_SYRINGE | INTRAVENOUS | Status: AC
  Filled 2018-09-30: qty 5

## 2018-09-30 MED ORDER — CEFAZOLIN SODIUM-DEXTROSE 1-4 GM/50ML-% IV SOLN
1.0000 g | INTRAVENOUS | Status: AC
  Administered 2018-09-30: 1 g via INTRAVENOUS

## 2018-09-30 MED ORDER — ONDANSETRON HCL 4 MG/2ML IJ SOLN: 4.0000 mg | Freq: Four times a day (QID) | INTRAMUSCULAR | Status: DC | PRN

## 2018-09-30 MED ORDER — DOCUSATE SODIUM 100 MG PO CAPS
100.0000 mg | ORAL_CAPSULE | Freq: Two times a day (BID) | ORAL | Status: DC
  Administered 2018-09-30 – 2018-10-01 (×2): 100 mg via ORAL
  Filled 2018-09-30 (×2): qty 1

## 2018-09-30 MED ORDER — PHENYLEPHRINE 40 MCG/ML (10ML) SYRINGE FOR IV PUSH (FOR BLOOD PRESSURE SUPPORT)
PREFILLED_SYRINGE | INTRAVENOUS | Status: DC | PRN
  Administered 2018-09-30 (×3): 120 ug via INTRAVENOUS

## 2018-09-30 MED ORDER — PROPOFOL 10 MG/ML IV BOLUS
INTRAVENOUS | Status: DC | PRN
  Administered 2018-09-30: 200 mg via INTRAVENOUS

## 2018-09-30 MED ORDER — LACTATED RINGERS IV SOLN
INTRAVENOUS | Status: DC
  Administered 2018-09-30 (×2): via INTRAVENOUS

## 2018-09-30 MED ORDER — BUPIVACAINE HCL (PF) 0.25 % IJ SOLN
INTRAMUSCULAR | Status: AC
  Filled 2018-09-30: qty 30

## 2018-09-30 MED ORDER — 0.9 % SODIUM CHLORIDE (POUR BTL) OPTIME
TOPICAL | Status: DC | PRN
  Administered 2018-09-30: 1000 mL

## 2018-09-30 MED ORDER — VITAMIN C 500 MG PO TABS
1000.0000 mg | ORAL_TABLET | Freq: Every day | ORAL | Status: DC
  Administered 2018-10-01: 1000 mg via ORAL
  Filled 2018-09-30: qty 2

## 2018-09-30 MED ORDER — DEXAMETHASONE SODIUM PHOSPHATE 10 MG/ML IJ SOLN
INTRAMUSCULAR | Status: AC
  Filled 2018-09-30: qty 1

## 2018-09-30 MED ORDER — FENTANYL CITRATE (PF) 100 MCG/2ML IJ SOLN
INTRAMUSCULAR | Status: DC | PRN
  Administered 2018-09-30: 75 ug via INTRAVENOUS
  Administered 2018-09-30: 50 ug via INTRAVENOUS
  Administered 2018-09-30: 75 ug via INTRAVENOUS
  Administered 2018-09-30: 50 ug via INTRAVENOUS

## 2018-09-30 MED ORDER — FENTANYL CITRATE (PF) 100 MCG/2ML IJ SOLN
INTRAMUSCULAR | Status: AC
  Administered 2018-09-30: 50 ug via INTRAVENOUS
  Filled 2018-09-30: qty 2

## 2018-09-30 MED ORDER — IRBESARTAN 300 MG PO TABS
300.0000 mg | ORAL_TABLET | Freq: Every evening | ORAL | Status: DC
  Administered 2018-09-30: 300 mg via ORAL
  Filled 2018-09-30: qty 1

## 2018-09-30 MED ORDER — TAMSULOSIN HCL 0.4 MG PO CAPS
0.8000 mg | ORAL_CAPSULE | Freq: Every evening | ORAL | Status: DC
  Administered 2018-09-30: 0.8 mg via ORAL
  Filled 2018-09-30: qty 2

## 2018-09-30 MED ORDER — PROMETHAZINE HCL 12.5 MG RE SUPP
12.5000 mg | Freq: Four times a day (QID) | RECTAL | Status: DC | PRN
  Filled 2018-09-30: qty 1

## 2018-09-30 MED ORDER — ADULT MULTIVITAMIN W/MINERALS CH
1.0000 | ORAL_TABLET | Freq: Every day | ORAL | Status: DC
  Administered 2018-10-01: 1 via ORAL
  Filled 2018-09-30: qty 1

## 2018-09-30 MED ORDER — ASPIRIN EC 81 MG PO TBEC
81.0000 mg | DELAYED_RELEASE_TABLET | Freq: Every evening | ORAL | Status: DC
  Administered 2018-09-30: 81 mg via ORAL
  Filled 2018-09-30: qty 1

## 2018-09-30 MED ORDER — METHOCARBAMOL 1000 MG/10ML IJ SOLN
500.0000 mg | Freq: Four times a day (QID) | INTRAVENOUS | Status: DC | PRN
  Filled 2018-09-30: qty 5

## 2018-09-30 MED ORDER — FENTANYL CITRATE (PF) 250 MCG/5ML IJ SOLN
INTRAMUSCULAR | Status: AC
Start: 2018-09-30 — End: 2018-09-30
  Filled 2018-09-30: qty 5

## 2018-09-30 MED ORDER — MIDAZOLAM HCL 2 MG/2ML IJ SOLN
INTRAMUSCULAR | Status: AC
  Filled 2018-09-30: qty 2

## 2018-09-30 MED ORDER — LIDOCAINE 2% (20 MG/ML) 5 ML SYRINGE
INTRAMUSCULAR | Status: AC
  Filled 2018-09-30: qty 5

## 2018-09-30 MED ORDER — ONDANSETRON HCL 4 MG/2ML IJ SOLN
INTRAMUSCULAR | Status: AC
  Filled 2018-09-30: qty 2

## 2018-09-30 MED ORDER — EPHEDRINE SULFATE-NACL 50-0.9 MG/10ML-% IV SOSY
PREFILLED_SYRINGE | INTRAVENOUS | Status: DC | PRN
  Administered 2018-09-30: 15 mg via INTRAVENOUS
  Administered 2018-09-30: 20 mg via INTRAVENOUS
  Administered 2018-09-30: 15 mg via INTRAVENOUS

## 2018-09-30 MED ORDER — ROCURONIUM BROMIDE 10 MG/ML (PF) SYRINGE
PREFILLED_SYRINGE | INTRAVENOUS | Status: DC | PRN
  Administered 2018-09-30: 50 mg via INTRAVENOUS

## 2018-09-30 MED ORDER — CEFAZOLIN SODIUM-DEXTROSE 1-4 GM/50ML-% IV SOLN
1.0000 g | Freq: Three times a day (TID) | INTRAVENOUS | Status: DC
  Administered 2018-09-30 – 2018-10-01 (×2): 1 g via INTRAVENOUS
  Filled 2018-09-30 (×3): qty 50

## 2018-09-30 MED ORDER — PROPOFOL 10 MG/ML IV BOLUS
INTRAVENOUS | Status: AC
  Filled 2018-09-30: qty 20

## 2018-09-30 MED ORDER — CEFAZOLIN SODIUM-DEXTROSE 1-4 GM/50ML-% IV SOLN
INTRAVENOUS | Status: AC
  Filled 2018-09-30: qty 50

## 2018-09-30 MED ORDER — ACETAMINOPHEN 500 MG PO TABS: 500.0000 mg | ORAL_TABLET | Freq: Two times a day (BID) | ORAL | Status: DC | PRN

## 2018-09-30 MED ORDER — PANTOPRAZOLE SODIUM 20 MG PO TBEC
20.0000 mg | DELAYED_RELEASE_TABLET | Freq: Every day | ORAL | Status: DC
  Administered 2018-10-01: 20 mg via ORAL
  Filled 2018-09-30: qty 1

## 2018-09-30 MED ORDER — CEFAZOLIN SODIUM-DEXTROSE 2-4 GM/100ML-% IV SOLN
INTRAVENOUS | Status: AC
  Filled 2018-09-30: qty 100

## 2018-09-30 MED ORDER — OXYCODONE HCL 5 MG PO TABS
5.0000 mg | ORAL_TABLET | ORAL | Status: DC | PRN
  Administered 2018-09-30 – 2018-10-01 (×4): 10 mg via ORAL
  Administered 2018-10-01: 5 mg via ORAL
  Filled 2018-09-30: qty 2
  Filled 2018-09-30: qty 1
  Filled 2018-09-30 (×3): qty 2

## 2018-09-30 MED ORDER — DEXAMETHASONE SODIUM PHOSPHATE 10 MG/ML IJ SOLN
INTRAMUSCULAR | Status: DC | PRN
  Administered 2018-09-30: 10 mg via INTRAVENOUS

## 2018-09-30 MED ORDER — LACTATED RINGERS IV SOLN
INTRAVENOUS | Status: DC
  Administered 2018-09-30: 12:00:00 via INTRAVENOUS

## 2018-09-30 MED ORDER — ONDANSETRON HCL 4 MG/2ML IJ SOLN
INTRAMUSCULAR | Status: DC | PRN
  Administered 2018-09-30: 4 mg via INTRAVENOUS

## 2018-09-30 MED ORDER — FENTANYL CITRATE (PF) 100 MCG/2ML IJ SOLN
25.0000 ug | INTRAMUSCULAR | Status: DC | PRN
  Administered 2018-09-30: 50 ug via INTRAVENOUS

## 2018-09-30 MED ORDER — ACETAMINOPHEN 10 MG/ML IV SOLN
INTRAVENOUS | Status: AC
  Administered 2018-09-30: 1000 mg via INTRAVENOUS
  Filled 2018-09-30: qty 100

## 2018-09-30 MED ORDER — LIDOCAINE HCL (CARDIAC) PF 100 MG/5ML IV SOSY
PREFILLED_SYRINGE | INTRAVENOUS | Status: DC | PRN
  Administered 2018-09-30: 60 mg via INTRAVENOUS

## 2018-09-30 MED ORDER — SODIUM CHLORIDE 0.9 % IV SOLN
INTRAVENOUS | Status: DC | PRN
  Administered 2018-09-30: 100 ug/min via INTRAVENOUS

## 2018-09-30 MED ORDER — SUGAMMADEX SODIUM 200 MG/2ML IV SOLN
INTRAVENOUS | Status: DC | PRN
  Administered 2018-09-30: 200 mg via INTRAVENOUS

## 2018-09-30 MED ORDER — CHLORHEXIDINE GLUCONATE 4 % EX LIQD: 60.0000 mL | Freq: Once | CUTANEOUS | Status: DC

## 2018-09-30 MED ORDER — CEFAZOLIN SODIUM-DEXTROSE 2-4 GM/100ML-% IV SOLN
2.0000 g | INTRAVENOUS | Status: AC
  Administered 2018-09-30: 2 g via INTRAVENOUS

## 2018-09-30 SURGICAL SUPPLY — 56 items
BANDAGE ACE 3X5.8 VEL STRL LF (GAUZE/BANDAGES/DRESSINGS) ×2 IMPLANT
BANDAGE ACE 4X5 VEL STRL LF (GAUZE/BANDAGES/DRESSINGS) ×2 IMPLANT
BIT DRILL 2.5X2.75 QC CALB (BIT) ×2 IMPLANT
BIT DRILL CANNULATED (DRILL) ×1 IMPLANT
BNDG COHESIVE 4X5 TAN STRL (GAUZE/BANDAGES/DRESSINGS) IMPLANT
BNDG ESMARK 4X9 LF (GAUZE/BANDAGES/DRESSINGS) ×2 IMPLANT
BNDG GAUZE ELAST 4 BULKY (GAUZE/BANDAGES/DRESSINGS) ×2 IMPLANT
CABLE CERCLAGE W/NDL GRIP (Cable) ×2 IMPLANT
CORDS BIPOLAR (ELECTRODE) ×2 IMPLANT
COVER MAYO STAND STRL (DRAPES) ×2 IMPLANT
COVER SURGICAL LIGHT HANDLE (MISCELLANEOUS) ×2 IMPLANT
COVER WAND RF STERILE (DRAPES) ×2 IMPLANT
CUFF TOURNIQUET SINGLE 18IN (TOURNIQUET CUFF) ×2 IMPLANT
CUFF TOURNIQUET SINGLE 24IN (TOURNIQUET CUFF) IMPLANT
DRAPE INCISE IOBAN 66X45 STRL (DRAPES) ×2 IMPLANT
DRAPE OEC MINIVIEW 54X84 (DRAPES) ×2 IMPLANT
DRILL CANNULATED (DRILL) ×2
DRSG ADAPTIC 3X8 NADH LF (GAUZE/BANDAGES/DRESSINGS) ×2 IMPLANT
GAUZE SPONGE 4X4 12PLY STRL (GAUZE/BANDAGES/DRESSINGS) IMPLANT
GAUZE SPONGE 4X4 12PLY STRL LF (GAUZE/BANDAGES/DRESSINGS) ×2 IMPLANT
GAUZE XEROFORM 1X8 LF (GAUZE/BANDAGES/DRESSINGS) ×2 IMPLANT
GAUZE XEROFORM 5X9 LF (GAUZE/BANDAGES/DRESSINGS) ×2 IMPLANT
GLOVE BIOGEL M 8.0 STRL (GLOVE) IMPLANT
GLOVE SS BIOGEL STRL SZ 8 (GLOVE) ×2 IMPLANT
GLOVE SUPERSENSE BIOGEL SZ 8 (GLOVE) ×2
GOWN STRL REUS W/ TWL LRG LVL3 (GOWN DISPOSABLE) ×2 IMPLANT
GOWN STRL REUS W/ TWL XL LVL3 (GOWN DISPOSABLE) ×1 IMPLANT
GOWN STRL REUS W/TWL LRG LVL3 (GOWN DISPOSABLE) ×2
GOWN STRL REUS W/TWL XL LVL3 (GOWN DISPOSABLE) ×1
GUIDEPIN 3.2MM DIA 12INL (PIN) ×2 IMPLANT
KIT BASIN OR (CUSTOM PROCEDURE TRAY) ×2 IMPLANT
KIT TURNOVER KIT B (KITS) ×2 IMPLANT
LOOP VESSEL MAXI BLUE (MISCELLANEOUS) IMPLANT
MANIFOLD NEPTUNE II (INSTRUMENTS) ×2 IMPLANT
NEEDLE HYPO 25GX1X1/2 BEV (NEEDLE) IMPLANT
NS IRRIG 1000ML POUR BTL (IV SOLUTION) ×2 IMPLANT
PACK ORTHO EXTREMITY (CUSTOM PROCEDURE TRAY) ×2 IMPLANT
PAD ARMBOARD 7.5X6 YLW CONV (MISCELLANEOUS) ×6 IMPLANT
PAD CAST 3X4 CTTN HI CHSV (CAST SUPPLIES) ×1 IMPLANT
PAD CAST 4YDX4 CTTN HI CHSV (CAST SUPPLIES) ×1 IMPLANT
PADDING CAST COTTON 3X4 STRL (CAST SUPPLIES) ×1
PADDING CAST COTTON 4X4 STRL (CAST SUPPLIES) ×1
PUTTY DBM STAGRAFT PLUS 5CC (Putty) ×2 IMPLANT
SCREW CANN 6.5X120X32MM THREAD (Screw) ×2 IMPLANT
SCRUB BETADINE 4OZ XXX (MISCELLANEOUS) ×2 IMPLANT
SOL PREP POV-IOD 4OZ 10% (MISCELLANEOUS) ×2 IMPLANT
SPECIMEN JAR SMALL (MISCELLANEOUS) IMPLANT
SPLINT FIBERGLASS 4X30 (CAST SUPPLIES) ×2 IMPLANT
SUT PROLENE 3 0 PS 2 (SUTURE) ×2 IMPLANT
SUT VIC AB 3-0 FS2 27 (SUTURE) ×4 IMPLANT
SYR CONTROL 10ML LL (SYRINGE) IMPLANT
TOWEL OR 17X24 6PK STRL BLUE (TOWEL DISPOSABLE) IMPLANT
TOWEL OR 17X26 10 PK STRL BLUE (TOWEL DISPOSABLE) ×2 IMPLANT
TUBE CONNECTING 12X1/4 (SUCTIONS) ×4 IMPLANT
UNDERPAD 30X30 (UNDERPADS AND DIAPERS) ×4 IMPLANT
WATER STERILE IRR 1000ML POUR (IV SOLUTION) ×2 IMPLANT

## 2018-09-30 NOTE — Op Note (Signed)
See dictation#[id number] SP ORIF Olecrannon Fx left  Jhane Lorio MD

## 2018-09-30 NOTE — H&P (Signed)
Bryan Crane is an 72 y.o. male.   Chief Complaint: Left elbow comminuted olecranon fracture HPI: Patient presents for surgical reconstruction left elbow comminuted fracture with ORIF attempts.  Patient understands risk benefits surgery we will proceed accordingly.  Patient presents for evaluation and treatment of the of their upper extremity predicament. The patient denies neck, back, chest or  abdominal pain. The patient notes that they have no lower extremity problems. The patients primary complaint is noted. We are planning surgical care pathway for the upper extremity.  Past Medical History:  Diagnosis Date  . Arthritis   . Cardiomyopathy (HCC)   . CKD (chronic kidney disease)   . Dyspnea    with exertion  . Enlarged prostate   . GERD (gastroesophageal reflux disease)   . History of kidney stones   . Hypertension   . Sleep apnea    uses cpap    Past Surgical History:  Procedure Laterality Date  . CARDIAC CATHETERIZATION     "years ago" - states it was clear  . COLONOSCOPY    . KIDNEY STONE SURGERY    . TONSILLECTOMY      History reviewed. No pertinent family history. Social History:  reports that he has never smoked. He has never used smokeless tobacco. He reports that he does not drink alcohol or use drugs.  Allergies: No Known Allergies  Medications Prior to Admission  Medication Sig Dispense Refill  . acetaminophen (TYLENOL) 500 MG tablet Take 500 mg by mouth 2 (two) times daily as needed for moderate pain or headache.    Marland Kitchen. aspirin EC 81 MG tablet Take 81 mg by mouth every evening.    Marland Kitchen. HYDROcodone-acetaminophen (NORCO/VICODIN) 5-325 MG tablet Take 1 tablet by mouth every 6 (six) hours as needed for moderate pain.    Marland Kitchen. irbesartan (AVAPRO) 300 MG tablet Take 300 mg by mouth every evening.    . lansoprazole (PREVACID) 30 MG capsule Take 30 mg by mouth daily at 12 noon.    . latanoprost (XALATAN) 0.005 % ophthalmic solution Place 1 drop into both eyes at bedtime.   6  . Multiple Vitamin (MULTIVITAMIN WITH MINERALS) TABS tablet Take 1 tablet by mouth daily.    Marland Kitchen. RANEXA 1000 MG SR tablet Take 1,000 mg by mouth daily.   3  . sodium chloride (OCEAN) 0.65 % SOLN nasal spray Place 1 spray into both nostrils as needed for congestion.    . tamsulosin (FLOMAX) 0.4 MG CAPS capsule Take 0.8 mg by mouth every evening.      Results for orders placed or performed during the hospital encounter of 09/30/18 (from the past 48 hour(s))  Hemoglobin     Status: None   Collection Time: 09/30/18  7:04 AM  Result Value Ref Range   Hemoglobin 14.1 13.0 - 17.0 g/dL    Comment: Performed at [facility], [address] [place], [place] [address]   No results found.  Review of Systems  Respiratory: Negative.   Cardiovascular: Negative for chest pain and palpitations.  Gastrointestinal: Negative.   Genitourinary: Negative.     Pulse 79, temperature 98 F (36.7 C), temperature source Oral, resp. rate 18, height 5' 6.5" (1.689 m), weight 93 kg, SpO2 97 %. Physical Exam  Left elbow fracture displaced with intact skin.  No evidence of infection or dystrophic reaction no evidence of vascular compromise.  The patient is alert and oriented in no acute distress. The patient complains of pain in the affected upper extremity.  The patient is noted to have a normal HEENT exam. Lung fields show equal chest expansion and no shortness of breath. Abdomen exam is nontender without distention. Lower extremity examination does not show any fracture dislocation or blood clot symptoms. Pelvis is stable and the neck and back are stable and nontender. Assessment/Plan Patient is being cleared for surgery.  He has a history of cardiomyopathy.  His chart has been reviewed and evaluated.  We will plan for ORIF comminuted left elbow fracture.  I discussed all issues with him and his family.  We are planning surgery for your upper extremity. The risk and benefits of surgery to include  risk of bleeding, infection, anesthesia,  damage to normal structures and failure of the surgery to accomplish its intended goals of relieving symptoms and restoring function have been discussed in detail. With this in mind we plan to proceed. I have specifically discussed with the patient the pre-and postoperative regime and the dos and don'ts and risk and benefits in great detail. Risk and benefits of surgery also include risk of dystrophy(CRPS), chronic nerve pain, failure of the healing process to go onto completion and other inherent risks of surgery The relavent the pathophysiology of the disease/injury process, as well as the alternatives for treatment and postoperative course of action has been discussed in great detail with the patient who desires to proceed.  We will do everything in our power to help you (the patient) restore function to the upper extremity. It is a pleasure to see this patient today.   Oletta Cohn III, MD 09/30/2018, 7:39 AM

## 2018-09-30 NOTE — Op Note (Signed)
NAME: Bryan Crane, Bryan Crane MEDICAL RECORD XB:[id number] ACCOUNT [id number] DATE OF BIRTH:1945/12/28 FACILITY: [facility] LOCATION: [facility] PHYSICIAN:Annamae Shivley M. Tamsyn Owusu, MD  OPERATIVE REPORT  DATE OF PROCEDURE:  09/30/2018  PREOPERATIVE DIAGNOSIS:  Comminuted complex intraarticular olecranon fracture, left elbow.  POSTOPERATIVE DIAGNOSES:  Comminuted complex intraarticular olecranon fracture, left elbow.  PROCEDURE: 1.  Open reduction internal fixation of left elbow olecranon fracture with StaGraft bone graft and a 6.5 cannulated screw, stainless steel in nature, with cable tension band. 2.  Five-view radiographic series, left elbow, performed, examined, and interpreted by myself.  SURGEON:  Dominica Severin, MD  ASSISTANT:  None.  COMPLICATIONS:  None.  ANESTHESIA:  General.  TOURNIQUET TIME:  Less than an hour.  INDICATIONS:  A 72 year old male who presents with above-mentioned diagnosis.  I have counseled regarding risks and benefits of surgery.  He has been cleared by his cardiologist, and anesthesia has reviewed his chart.  He was taken to the procedure suite and underwent smooth induction of general anesthesia, prepped with Hibiclens scrub, followed by a 10-minute surgical Betadine scrub and paint by myself.  Arm was secured with sterile field.  Timeout was observed, and  following this a sterile tourniquet was applied and insufflated.  A posterior incision was made.  Dissection was carried down.  I very carefully performed a bursectomy, followed by identification of the fracture site.  The patient had loose cartilaginous  pieces that had to be reassembled.  A combination of curette, knife, scissors, suction and irrigation was accomplished to recreate the articular cartilage anatomy.  Following this, I held provisional fixation with K wire and clamp and then performed a  very careful and cautious approach to the extremity with placement of a cable greater than 2 diameters distal to the  fracture site.  This was placed through and through with drill bits.  Following this, the patient then underwent placement of an  intramedullary guide pin, followed by placement of a 6.5 cannulated screw with washer.  The cable was placed against the end of the proximal bone, and this allowed for coaptation of the fracture.  The patient tolerated this well.  There were no  complicating features.  Following this, I was able to tension the cable nicely.  This was a 6.5 cannulated screw with washer and tension band construct with the cable.  This was tensioned appropriately.  The patient was placed through a full range of  motion and all looked well.  There were no complicating features following placement through a full range of motion.  We took final x-rays.  We irrigated copiously with the tourniquet deflated at less than 40 minutes and closed the wound in layers of 3-0  Vicryl followed by 3-0 Prolene.  Adaptic, Xeroform, and gauze were placed, followed by a long arm splint.  The patient will be admitted for IV antibiotics, general postoperative observation and other measures.  This was an ORIF comminuted complex  olecranon fracture, left elbow.  Going forward with therapy, he will need to be in a splint at 30 degrees and begin 0-30 degree range of motion immediately at 2 weeks postop, followed by moving him approximately 15-20 degrees every week or so.  At about 6 to 8 weeks, I would like to get  him to 90 degrees and after 8 weeks full go with motion predicated on the bone healing.  Skin will take at least 8 weeks as this was significantly comminuted.  Thus, we will get him to 90 at 6-8 weeks and then beyond  this go full, predicated on x-rays.   I do not want to stress him extensively the first 6-8 weeks due to the comminuted nature.  These notes have been discussed, and all questions were encouraged and answered.  LN/NUANCE  D:09/30/2018 T:09/30/2018 JOB:[id number]

## 2018-09-30 NOTE — Progress Notes (Signed)
RT set up patient home unit CPAP machine. No O2 bleed in needed. Patient able to place himself on and off when he is ready.

## 2018-09-30 NOTE — Anesthesia Postprocedure Evaluation (Signed)
Anesthesia Post Note  Patient: Bryan Crane  Procedure(s) Performed: Open reduction internal fixation left elbow fracture with repair reconstruction and  allograft (Left Elbow)     Patient location during evaluation: PACU Anesthesia Type: General Level of consciousness: awake and alert Pain management: pain level controlled Vital Signs Assessment: post-procedure vital signs reviewed and stable Respiratory status: spontaneous breathing, nonlabored ventilation, respiratory function stable and patient connected to nasal cannula oxygen Cardiovascular status: blood pressure returned to baseline and stable Postop Assessment: no apparent nausea or vomiting Anesthetic complications: no    Last Vitals:  Vitals:   09/30/18 1102 09/30/18 1153  BP: (!) 147/80 (!) 163/78  Pulse: 75 86  Resp: 12 18  Temp:  36.4 C  SpO2: 99% 100%    Last Pain:  Vitals:   09/30/18 1635  TempSrc:   PainSc: 6                  Bryan Crane

## 2018-09-30 NOTE — Progress Notes (Signed)
Pt placed on hold at 1102 - nurse unable to transfer pt  out of PACU  at this time - pt transferred out of PACU at approx 1130

## 2018-09-30 NOTE — Anesthesia Procedure Notes (Signed)
Procedure Name: Intubation Date/Time: 09/30/2018 7:56 AM Performed by: Carney Living, CRNA Pre-anesthesia Checklist: Patient identified, Emergency Drugs available, Suction available, Patient being monitored and Timeout performed Patient Re-evaluated:Patient Re-evaluated prior to induction Oxygen Delivery Method: Circle system utilized Preoxygenation: Pre-oxygenation with 100% oxygen Induction Type: IV induction Ventilation: Mask ventilation without difficulty and Oral airway inserted - appropriate to patient size Laryngoscope Size: Mac and 3 Grade View: Grade I Tube type: Oral Tube size: 7.5 mm Number of attempts: 1 Airway Equipment and Method: Stylet Placement Confirmation: ETT inserted through vocal cords under direct vision,  positive ETCO2 and breath sounds checked- equal and bilateral Secured at: 22 cm Tube secured with: Tape Dental Injury: Teeth and Oropharynx as per pre-operative assessment

## 2018-09-30 NOTE — Progress Notes (Signed)
Pt states family has his CPAP & pt confirms he is compliant at home with CPAP

## 2018-09-30 NOTE — Transfer of Care (Signed)
Immediate Anesthesia Transfer of Care Note  Patient: Bryan Crane  Procedure(s) Performed: Open reduction internal fixation left elbow fracture with repair reconstruction and  allograft (Left Elbow)  Patient Location: PACU  Anesthesia Type:General  Level of Consciousness: awake, alert , oriented and patient cooperative  Airway & Oxygen Therapy: Patient Spontanous Breathing and Patient connected to nasal cannula oxygen  Post-op Assessment: Report given to RN, Post -op Vital signs reviewed and stable and Patient moving all extremities X 4  Post vital signs: Reviewed and stable  Last Vitals:  Vitals Value Taken Time  BP 145/75 09/30/2018 10:02 AM  Temp    Pulse 78 09/30/2018 10:05 AM  Resp 9 09/30/2018 10:05 AM  SpO2 100 % 09/30/2018 10:05 AM  Vitals shown include unvalidated device data.  Last Pain:  Vitals:   09/30/18 0642  TempSrc:   PainSc: 0-No pain      Patients Stated Pain Goal: 5 (09/30/18 [id number])  Complications: No apparent anesthesia complications

## 2018-10-01 DIAGNOSIS — S52032A Displaced fracture of olecranon process with intraarticular extension of left ulna, initial encounter for closed fracture: Secondary | ICD-10-CM | POA: Diagnosis not present

## 2018-10-01 NOTE — Progress Notes (Signed)
Pt given discharge instructions and gone over with him and family present. Answered all questions. All belongings gathered to be sent home with pt.

## 2018-10-01 NOTE — Discharge Instructions (Signed)
Please take the pain medicine as necessary.  Please notify should any problems occur.  We recommend that you to take vitamin C 1000 mg a day to promote healing. We also recommend that if you require  pain medicine that you take a stool softener to prevent constipation as most pain medicines will have constipation side effects. We recommend either Peri-Colace or Senokot and recommend that you also consider adding MiraLAX as well to prevent the constipation affects from pain medicine if you are required to use them. These medicines are over the counter and may be purchased at a local pharmacy. A cup of yogurt and a probiotic can also be helpful during the recovery process as the medicines can disrupt your intestinal environment. Keep bandage clean and dry.  Call for any problems.  No smoking.  Criteria for driving a car: you should be off your pain medicine for 7-8 hours, able to drive one handed(confident), thinking clearly and feeling able in your judgement to drive. Continue elevation as it will decrease swelling.  If instructed by MD move your fingers within the confines of the bandage/splint.  Use ice if instructed by your MD. Call immediately for any sudden loss of feeling in your hand/arm or change in functional abilities of the extremity.

## 2018-10-01 NOTE — Progress Notes (Signed)
OT Cancellation Note  Patient Details Name: Bryan Crane MRN: [id number] DOB: 07/25/1946   Cancelled Treatment:    Reason Eval/Treat Not Completed: OT screened, no needs identified, will sign off.  Spoke with MD and PT, no further needs.  Bryan Crane, OTR/L Acute Rehabilitation Services Pager [phone] Office [phone]   Bryan Crane, Bryan Crane 10/01/2018, 9:21 AM

## 2018-10-01 NOTE — Evaluation (Signed)
Physical Therapy Evaluation Patient Details Name: Bryan Crane MRN: [id number] DOB: 12-Nov-1945 Today's Date: 10/01/2018   History of Present Illness  Pt is 72 yo male who presents for ORIF L olecranon for comminuted fracture. PMH: Hypertrophic cardimyopathy, HTN, CKD, OSA (wears CPAP).   Clinical Impression  Patient evaluated by Physical Therapy with no further acute PT needs identified. All education has been completed and the patient has no further questions. Pt mobilizing well, discussed ROM L shoulder as well as edema control L hand. Pt ready for d/c from mobility standpoint. See below for any follow-up Physical Therapy or equipment needs. PT is signing off. Thank you for this referral.     Follow Up Recommendations No PT follow up until specified by Dr Amanda Pea    Equipment Recommendations  None recommended by PT    Recommendations for Other Services       Precautions / Restrictions Precautions Precautions: None Precaution Comments: went over protection of L elbow due to posterior incision Splint/Cast - Date Prophylactic Dressing Applied (if applicable): 09/30/18 Restrictions Weight Bearing Restrictions: Yes LUE Weight Bearing: Non weight bearing      Mobility  Bed Mobility Overal bed mobility: Modified Independent             General bed mobility comments: increased time for in and out of bed but no assist needed even when bed flat  Transfers Overall transfer level: Modified independent Equipment used: None                Ambulation/Gait Ambulation/Gait assistance: Modified independent (Device/Increase time) Gait Distance (Feet): 500 Feet Assistive device: None Gait Pattern/deviations: Step-through pattern Gait velocity: increased with distance Gait velocity interpretation: >4.37 ft/sec, indicative of normal walking speed General Gait Details: pt began with decreased gait speed and holding LUE in shoulder flexion, worked on arm swing and arm by side  to promote trunk rotation  Stairs Stairs: Yes Stairs assistance: Modified independent (Device/Increase time) Stair Management: One rail Right;Alternating pattern;Forwards Number of Stairs: 5    Wheelchair Mobility    Modified Rankin (Stroke Patients Only)       Balance Overall balance assessment: Modified Independent                                           Pertinent Vitals/Pain Pain Assessment: 0-10 Pain Score: 3  Pain Location: L elbow Pain Descriptors / Indicators: Aching;Operative site guarding Pain Intervention(s): Limited activity within patient's tolerance;Monitored during session;Premedicated before session         VSS throughout  Home Living Family/patient expects to be discharged to:: Private residence Living Arrangements: Spouse/significant other Available Help at Discharge: Family;Available 24 hours/day Type of Home: House Home Access: Stairs to enter Entrance Stairs-Rails: Right;Left;Can reach both Entrance Stairs-Number of Steps: 3 Home Layout: One level Home Equipment: Cane - single point      Prior Function Level of Independence: Independent         Comments: retired Optician, dispensing, still does some part time work     Higher education careers adviser   Dominant Hand: Left    Extremity/Trunk Assessment   Upper Extremity Assessment Upper Extremity Assessment: LUE deficits/detail LUE Deficits / Details: pt with full ROM L shoulder but a little stiff. Elbow and wrist immobilized. educated on retrograde massage to fingers for swelling and elevation of LLE LUE: Unable to fully assess due to immobilization LUE Sensation: WNL LUE Coordination:  WNL    Lower Extremity Assessment Lower Extremity Assessment: Overall WFL for tasks assessed    Cervical / Trunk Assessment Cervical / Trunk Assessment: Normal  Communication   Communication: No difficulties  Cognition Arousal/Alertness: Awake/alert Behavior During Therapy: WFL for tasks  assessed/performed Overall Cognitive Status: Within Functional Limits for tasks assessed                                        General Comments General comments (skin integrity, edema, etc.): pt instructed in A and AA L shoulder    Exercises     Assessment/Plan    PT Assessment Patent does not need any further PT services  PT Problem List         PT Treatment Interventions      PT Goals (Current goals can be found in the Care Plan section)  Acute Rehab PT Goals Patient Stated Goal: return home and golf PT Goal Formulation: All assessment and education complete, DC therapy    Frequency     Barriers to discharge        Co-evaluation               AM-PAC PT "6 Clicks" Mobility  Outcome Measure Help needed turning from your back to your side while in a flat bed without using bedrails?: A Little Help needed moving from lying on your back to sitting on the side of a flat bed without using bedrails?: A Little Help needed moving to and from a bed to a chair (including a wheelchair)?: None Help needed standing up from a chair using your arms (e.g., wheelchair or bedside chair)?: None Help needed to walk in hospital room?: None Help needed climbing 3-5 steps with a railing? : None 6 Click Score: 22    End of Session Equipment Utilized During Treatment: Gait belt Activity Tolerance: Patient tolerated treatment well Patient left: in bed;with call bell/phone within reach Nurse Communication: Mobility status PT Visit Diagnosis: Pain Pain - Right/Left: Left Pain - part of body: Shoulder    Time: [id number] PT Time Calculation (min) (ACUTE ONLY): 35 min   Charges:   PT Evaluation $PT Eval Low Complexity: 1 Low PT Treatments $Gait Training: 8-22 mins        Bryan Crane, PT  Acute Rehab Services  Pager [phone] Office [phone]   Bryan Crane 10/01/2018, 8:34 AM

## 2018-10-01 NOTE — Plan of Care (Signed)
  Problem: Activity: Goal: Risk for activity intolerance will decrease Outcome: Progressing   Problem: Nutrition: Goal: Adequate nutrition will be maintained Outcome: Progressing   Problem: Coping: Goal: Level of anxiety will decrease Outcome: Progressing   Problem: Pain Managment: Goal: General experience of comfort will improve Outcome: Progressing   

## 2018-10-01 NOTE — Discharge Summary (Signed)
Physician Discharge Summary  Patient ID: SADAT SLIWA MRN: [id number] DOB/AGE: 73-Feb-1947 73 y.o.  Admit date: 09/30/2018 Discharge date:   Admission Diagnoses: Left ulna olecrannon fracture Past Medical History:  Diagnosis Date  . Arthritis   . Cardiomyopathy (HCC)   . CKD (chronic kidney disease)   . Dyspnea    with exertion  . Enlarged prostate   . GERD (gastroesophageal reflux disease)   . History of kidney stones   . Hypertension   . Sleep apnea    uses cpap    Discharge Diagnoses:  Active Problems:   Closed fracture of left olecranon process   Surgeries: Procedure(s): Open reduction internal fixation left elbow fracture with repair reconstruction and  allograft on 09/30/2018    Consultants:   Discharged Condition: Improved  Hospital Course: RALPHIE LOVELADY is an 72 y.o. male who was admitted 09/30/2018 with a chief complaint of No chief complaint on file. , and found to have a diagnosis of Left ulna olecrannon fracture.  They were brought to the operating room on 09/30/2018 and underwent Procedure(s): Open reduction internal fixation left elbow fracture with repair reconstruction and  allograft.    They were given perioperative antibiotics:  Anti-infectives (From admission, onward)   Start     Dose/Rate Route Frequency Ordered Stop   09/30/18 1815  ceFAZolin (ANCEF) IVPB 1 g/50 mL premix     1 g 100 mL/hr over 30 Minutes Intravenous Every 8 hours 09/30/18 1157     09/30/18 1030  ceFAZolin (ANCEF) IVPB 1 g/50 mL premix     1 g 100 mL/hr over 30 Minutes Intravenous NOW 09/30/18 1016 09/30/18 1058   09/30/18 1022  ceFAZolin (ANCEF) 1-4 GM/50ML-% IVPB    Note to Pharmacy:  Baltazar Najjar   : cabinet override      09/30/18 1022 09/30/18 2229   09/30/18 0630  ceFAZolin (ANCEF) IVPB 2g/100 mL premix     2 g 200 mL/hr over 30 Minutes Intravenous On call to O.R. 09/30/18 0617 09/30/18 0820   09/30/18 0622  ceFAZolin (ANCEF) 2-4 GM/100ML-% IVPB    Note to  Pharmacy:  Shireen Quan   : cabinet override      09/30/18 0622 09/30/18 0805    .  They were given sequential compression devices, early ambulation, and Other (comment) for DVT prophylaxis.  Recent vital signs:  Patient Vitals for the past 24 hrs:  BP Temp Temp src Pulse Resp SpO2  10/01/18 0524 (!) 114/56 97.6 F (36.4 C) Oral 73 - 98 %  10/01/18 0132 (!) 118/52 97.8 F (36.6 C) Oral 88 - 97 %  09/30/18 2019 133/71 98 F (36.7 C) Oral 96 - 97 %  09/30/18 1153 (!) 163/78 97.6 F (36.4 C) Oral 86 18 100 %  09/30/18 1102 (!) 147/80 - - 75 12 99 %  09/30/18 1100 - 98.2 F (36.8 C) - 79 11 99 %  09/30/18 1047 (!) 145/74 - - 80 (!) 9 98 %  09/30/18 1032 (!) 146/83 - - 75 10 100 %  09/30/18 1030 - - - 78 12 100 %  09/30/18 1028 - - - - 14 -  09/30/18 1017 (!) 144/80 - - 73 (!) 8 100 %  09/30/18 1002 (!) 145/75 98.3 F (36.8 C) - 75 13 99 %  .  Recent laboratory studies: No results found.  Discharge Medications:   Allergies as of 10/01/2018   No Known Allergies     Medication List    TAKE  these medications   acetaminophen 500 MG tablet Commonly known as:  TYLENOL Take 500 mg by mouth 2 (two) times daily as needed for moderate pain or headache.   aspirin EC 81 MG tablet Take 81 mg by mouth every evening.   HYDROcodone-acetaminophen 5-325 MG tablet Commonly known as:  NORCO/VICODIN Take 1 tablet by mouth every 6 (six) hours as needed for moderate pain.   irbesartan 300 MG tablet Commonly known as:  AVAPRO Take 300 mg by mouth every evening.   lansoprazole 30 MG capsule Commonly known as:  PREVACID Take 30 mg by mouth daily at 12 noon.   latanoprost 0.005 % ophthalmic solution Commonly known as:  XALATAN Place 1 drop into both eyes at bedtime.   multivitamin with minerals Tabs tablet Take 1 tablet by mouth daily.   RANEXA 1000 MG SR tablet Generic drug:  ranolazine Take 1,000 mg by mouth daily.   sodium chloride 0.65 % Soln nasal spray Commonly known as:   OCEAN Place 1 spray into both nostrils as needed for congestion.   tamsulosin 0.4 MG Caps capsule Commonly known as:  FLOMAX Take 0.8 mg by mouth every evening.       Diagnostic Studies: No results found.  They benefited maximally from their hospital stay and there were no complications.     Disposition: Discharge disposition: 01-Home or Self Care      Discharge Instructions    Call MD / Call 911   Complete by:  As directed    If you experience chest pain or shortness of breath, CALL 911 and be transported to the hospital emergency room.  If you develope a fever above 101 F, pus (white drainage) or increased drainage or redness at the wound, or calf pain, call your surgeon's office.   Constipation Prevention   Complete by:  As directed    Drink plenty of fluids.  Prune juice may be helpful.  You may use a stool softener, such as Colace (over the counter) 100 mg twice a day.  Use MiraLax (over the counter) for constipation as needed.   Diet - low sodium heart healthy   Complete by:  As directed    Increase activity slowly as tolerated   Complete by:  As directed      Follow-up Information    Dominica SeverinGramig, Ilsa Bonello, MD Follow up in 14 day(s).   Specialty:  Orthopedic Surgery Why:  We will call for your follow-up appointment in 2 weeks Contact information: [address] [place] [address] [phone]          Patient looks excellent status post reconstruction left elbow.  He will be discharged home on Keflex 500 mg 1 p.o. 4 times daily x7 days and will be discharged home on OxyIR as needed pain.  We will see him back in 2 weeks for our standard protocol with early range of motion.  At the time of discharge she was awake alert and oriented and doing quite well without complications.  There is no signs of DVT or UTI Final diagnosis-status post left elbow reconstruction/olecranon fracture intra-articular Signed: Oletta CohnWilliam M Asia Favata III 10/01/2018, 8:17 AM

## 2018-10-02 ENCOUNTER — Encounter (HOSPITAL_COMMUNITY): Payer: Self-pay | Admitting: Orthopedic Surgery

## 2018-10-05 ENCOUNTER — Encounter (HOSPITAL_COMMUNITY): Payer: Self-pay | Admitting: Orthopedic Surgery

## 2021-04-03 ENCOUNTER — Encounter (HOSPITAL_COMMUNITY): Payer: Self-pay

## 2021-04-03 ENCOUNTER — Inpatient Hospital Stay (HOSPITAL_COMMUNITY)
Admission: EM | Admit: 2021-04-03 | Discharge: 2021-04-06 | DRG: 041 | Disposition: A | Discharge status: Rehabilitation Facility | Payer: Medicare Other | Attending: Internal Medicine | Admitting: Internal Medicine

## 2021-04-03 ENCOUNTER — Other Ambulatory Visit: Payer: Self-pay

## 2021-04-03 DIAGNOSIS — E785 Hyperlipidemia, unspecified: Secondary | ICD-10-CM | POA: Diagnosis present

## 2021-04-03 DIAGNOSIS — R29701 NIHSS score 1: Secondary | ICD-10-CM | POA: Diagnosis present

## 2021-04-03 DIAGNOSIS — Z7982 Long term (current) use of aspirin: Secondary | ICD-10-CM

## 2021-04-03 DIAGNOSIS — R29703 NIHSS score 3: Secondary | ICD-10-CM | POA: Diagnosis not present

## 2021-04-03 DIAGNOSIS — Z79899 Other long term (current) drug therapy: Secondary | ICD-10-CM

## 2021-04-03 DIAGNOSIS — N1831 Chronic kidney disease, stage 3a: Secondary | ICD-10-CM | POA: Diagnosis present

## 2021-04-03 DIAGNOSIS — I63549 Cerebral infarction due to unspecified occlusion or stenosis of unspecified cerebellar artery: Principal | ICD-10-CM | POA: Diagnosis present

## 2021-04-03 DIAGNOSIS — I429 Cardiomyopathy, unspecified: Secondary | ICD-10-CM | POA: Diagnosis present

## 2021-04-03 DIAGNOSIS — I129 Hypertensive chronic kidney disease with stage 1 through stage 4 chronic kidney disease, or unspecified chronic kidney disease: Secondary | ICD-10-CM | POA: Diagnosis present

## 2021-04-03 DIAGNOSIS — Z20822 Contact with and (suspected) exposure to covid-19: Secondary | ICD-10-CM | POA: Diagnosis present

## 2021-04-03 DIAGNOSIS — I639 Cerebral infarction, unspecified: Secondary | ICD-10-CM

## 2021-04-03 DIAGNOSIS — I10 Essential (primary) hypertension: Secondary | ICD-10-CM

## 2021-04-03 DIAGNOSIS — H5462 Unqualified visual loss, left eye, normal vision right eye: Secondary | ICD-10-CM | POA: Diagnosis present

## 2021-04-03 DIAGNOSIS — R471 Dysarthria and anarthria: Secondary | ICD-10-CM

## 2021-04-03 DIAGNOSIS — R42 Dizziness and giddiness: Secondary | ICD-10-CM | POA: Diagnosis not present

## 2021-04-03 DIAGNOSIS — R29702 NIHSS score 2: Secondary | ICD-10-CM | POA: Diagnosis not present

## 2021-04-03 DIAGNOSIS — R29700 NIHSS score 0: Secondary | ICD-10-CM | POA: Diagnosis not present

## 2021-04-03 DIAGNOSIS — H5509 Other forms of nystagmus: Secondary | ICD-10-CM | POA: Diagnosis present

## 2021-04-03 DIAGNOSIS — G4733 Obstructive sleep apnea (adult) (pediatric): Secondary | ICD-10-CM | POA: Diagnosis present

## 2021-04-03 DIAGNOSIS — K219 Gastro-esophageal reflux disease without esophagitis: Secondary | ICD-10-CM | POA: Diagnosis present

## 2021-04-03 DIAGNOSIS — N400 Enlarged prostate without lower urinary tract symptoms: Secondary | ICD-10-CM | POA: Diagnosis present

## 2021-04-03 DIAGNOSIS — R4781 Slurred speech: Secondary | ICD-10-CM | POA: Diagnosis present

## 2021-04-03 DIAGNOSIS — Z87442 Personal history of urinary calculi: Secondary | ICD-10-CM

## 2021-04-03 DIAGNOSIS — R270 Ataxia, unspecified: Secondary | ICD-10-CM | POA: Diagnosis present

## 2021-04-03 DIAGNOSIS — N183 Chronic kidney disease, stage 3 unspecified: Secondary | ICD-10-CM

## 2021-04-03 NOTE — ED Triage Notes (Signed)
Pt brought in by EMS for dizziness. Pt also reports a slight headache.

## 2021-04-04 ENCOUNTER — Inpatient Hospital Stay (HOSPITAL_COMMUNITY): Payer: Medicare Other

## 2021-04-04 ENCOUNTER — Emergency Department (HOSPITAL_COMMUNITY): Payer: Medicare Other

## 2021-04-04 ENCOUNTER — Encounter (HOSPITAL_COMMUNITY): Payer: Self-pay | Admitting: Family Medicine

## 2021-04-04 DIAGNOSIS — N400 Enlarged prostate without lower urinary tract symptoms: Secondary | ICD-10-CM | POA: Diagnosis present

## 2021-04-04 DIAGNOSIS — I6389 Other cerebral infarction: Secondary | ICD-10-CM | POA: Diagnosis not present

## 2021-04-04 DIAGNOSIS — E669 Obesity, unspecified: Secondary | ICD-10-CM

## 2021-04-04 DIAGNOSIS — R29700 NIHSS score 0: Secondary | ICD-10-CM | POA: Diagnosis not present

## 2021-04-04 DIAGNOSIS — G4733 Obstructive sleep apnea (adult) (pediatric): Secondary | ICD-10-CM | POA: Diagnosis present

## 2021-04-04 DIAGNOSIS — Z6831 Body mass index (BMI) 31.0-31.9, adult: Secondary | ICD-10-CM

## 2021-04-04 DIAGNOSIS — Z8673 Personal history of transient ischemic attack (TIA), and cerebral infarction without residual deficits: Secondary | ICD-10-CM | POA: Diagnosis not present

## 2021-04-04 DIAGNOSIS — I10 Essential (primary) hypertension: Secondary | ICD-10-CM

## 2021-04-04 DIAGNOSIS — R270 Ataxia, unspecified: Secondary | ICD-10-CM | POA: Diagnosis present

## 2021-04-04 DIAGNOSIS — N183 Chronic kidney disease, stage 3 unspecified: Secondary | ICD-10-CM | POA: Diagnosis present

## 2021-04-04 DIAGNOSIS — Z87442 Personal history of urinary calculi: Secondary | ICD-10-CM | POA: Diagnosis not present

## 2021-04-04 DIAGNOSIS — R29701 NIHSS score 1: Secondary | ICD-10-CM | POA: Diagnosis present

## 2021-04-04 DIAGNOSIS — N1832 Chronic kidney disease, stage 3b: Secondary | ICD-10-CM

## 2021-04-04 DIAGNOSIS — I429 Cardiomyopathy, unspecified: Secondary | ICD-10-CM | POA: Diagnosis present

## 2021-04-04 DIAGNOSIS — Z7902 Long term (current) use of antithrombotics/antiplatelets: Secondary | ICD-10-CM | POA: Diagnosis not present

## 2021-04-04 DIAGNOSIS — N401 Enlarged prostate with lower urinary tract symptoms: Secondary | ICD-10-CM | POA: Diagnosis not present

## 2021-04-04 DIAGNOSIS — E785 Hyperlipidemia, unspecified: Secondary | ICD-10-CM

## 2021-04-04 DIAGNOSIS — R3911 Hesitancy of micturition: Secondary | ICD-10-CM | POA: Diagnosis not present

## 2021-04-04 DIAGNOSIS — I639 Cerebral infarction, unspecified: Secondary | ICD-10-CM | POA: Diagnosis not present

## 2021-04-04 DIAGNOSIS — R29703 NIHSS score 3: Secondary | ICD-10-CM | POA: Diagnosis not present

## 2021-04-04 DIAGNOSIS — N1831 Chronic kidney disease, stage 3a: Secondary | ICD-10-CM | POA: Diagnosis present

## 2021-04-04 DIAGNOSIS — R4781 Slurred speech: Secondary | ICD-10-CM | POA: Diagnosis present

## 2021-04-04 DIAGNOSIS — I63549 Cerebral infarction due to unspecified occlusion or stenosis of unspecified cerebellar artery: Secondary | ICD-10-CM | POA: Diagnosis present

## 2021-04-04 DIAGNOSIS — I69322 Dysarthria following cerebral infarction: Secondary | ICD-10-CM | POA: Diagnosis present

## 2021-04-04 DIAGNOSIS — R471 Dysarthria and anarthria: Secondary | ICD-10-CM | POA: Diagnosis present

## 2021-04-04 DIAGNOSIS — Z20822 Contact with and (suspected) exposure to covid-19: Secondary | ICD-10-CM | POA: Diagnosis present

## 2021-04-04 DIAGNOSIS — I69328 Other speech and language deficits following cerebral infarction: Secondary | ICD-10-CM | POA: Diagnosis not present

## 2021-04-04 DIAGNOSIS — R29702 NIHSS score 2: Secondary | ICD-10-CM | POA: Diagnosis not present

## 2021-04-04 DIAGNOSIS — G4730 Sleep apnea, unspecified: Secondary | ICD-10-CM | POA: Diagnosis present

## 2021-04-04 DIAGNOSIS — H5462 Unqualified visual loss, left eye, normal vision right eye: Secondary | ICD-10-CM | POA: Diagnosis present

## 2021-04-04 DIAGNOSIS — K219 Gastro-esophageal reflux disease without esophagitis: Secondary | ICD-10-CM

## 2021-04-04 DIAGNOSIS — I129 Hypertensive chronic kidney disease with stage 1 through stage 4 chronic kidney disease, or unspecified chronic kidney disease: Secondary | ICD-10-CM | POA: Diagnosis present

## 2021-04-04 DIAGNOSIS — H5509 Other forms of nystagmus: Secondary | ICD-10-CM | POA: Diagnosis present

## 2021-04-04 DIAGNOSIS — Z79899 Other long term (current) drug therapy: Secondary | ICD-10-CM | POA: Diagnosis not present

## 2021-04-04 DIAGNOSIS — Z9989 Dependence on other enabling machines and devices: Secondary | ICD-10-CM

## 2021-04-04 DIAGNOSIS — Z7982 Long term (current) use of aspirin: Secondary | ICD-10-CM | POA: Diagnosis not present

## 2021-04-04 DIAGNOSIS — R42 Dizziness and giddiness: Secondary | ICD-10-CM | POA: Diagnosis present

## 2021-04-04 LAB — DIFFERENTIAL
Abs Immature Granulocytes: 0.03 10*3/uL (ref 0.00–0.07)
Basophils Absolute: 0.1 10*3/uL (ref 0.0–0.1)
Basophils Relative: 1 %
Eosinophils Absolute: 0.3 10*3/uL (ref 0.0–0.5)
Eosinophils Relative: 3 %
Immature Granulocytes: 0 %
Lymphocytes Relative: 21 %
Lymphs Abs: 1.9 10*3/uL (ref 0.7–4.0)
Monocytes Absolute: 0.8 10*3/uL (ref 0.1–1.0)
Monocytes Relative: 8 %
Neutro Abs: 6.4 10*3/uL (ref 1.7–7.7)
Neutrophils Relative %: 67 %

## 2021-04-04 LAB — CBC
HCT: 48 % (ref 39.0–52.0)
Hemoglobin: 16.7 g/dL (ref 13.0–17.0)
MCH: 31.6 pg (ref 26.0–34.0)
MCHC: 34.8 g/dL (ref 30.0–36.0)
MCV: 90.9 fL (ref 80.0–100.0)
Platelets: 304 10*3/uL (ref 150–400)
RBC: 5.28 MIL/uL (ref 4.22–5.81)
RDW: 12.5 % (ref 11.5–15.5)
WBC: 9.5 10*3/uL (ref 4.0–10.5)
nRBC: 0 % (ref 0.0–0.2)

## 2021-04-04 LAB — COMPREHENSIVE METABOLIC PANEL
ALT: 15 U/L (ref 0–44)
AST: 24 U/L (ref 15–41)
Albumin: 4 g/dL (ref 3.5–5.0)
Alkaline Phosphatase: 41 U/L (ref 38–126)
Anion gap: 9 (ref 5–15)
BUN: 20 mg/dL (ref 8–23)
CO2: 22 mmol/L (ref 22–32)
Calcium: 9.4 mg/dL (ref 8.9–10.3)
Chloride: 106 mmol/L (ref 98–111)
Creatinine, Ser: 1.83 mg/dL — ABNORMAL HIGH (ref 0.61–1.24)
GFR, Estimated: 38 mL/min — ABNORMAL LOW (ref >60)
Glucose, Bld: 107 mg/dL — ABNORMAL HIGH (ref 70–99)
Potassium: 4.5 mmol/L (ref 3.5–5.1)
Sodium: 137 mmol/L (ref 135–145)
Total Bilirubin: 0.8 mg/dL (ref 0.3–1.2)
Total Protein: 7.1 g/dL (ref 6.5–8.1)

## 2021-04-04 LAB — ETHANOL: Alcohol, Ethyl (B): <10 mg/dL (ref <10)

## 2021-04-04 LAB — RAPID URINE DRUG SCREEN, HOSP PERFORMED
Amphetamines: NOT DETECTED
Barbiturates: NOT DETECTED
Benzodiazepines: NOT DETECTED
Cocaine: NOT DETECTED
Opiates: NOT DETECTED
Tetrahydrocannabinol: NOT DETECTED

## 2021-04-04 LAB — LIPID PANEL
Cholesterol: 187 mg/dL (ref 0–200)
HDL: 40 mg/dL — ABNORMAL LOW (ref >40)
LDL Cholesterol: 114 mg/dL — ABNORMAL HIGH (ref 0–99)
Total CHOL/HDL Ratio: 4.7 RATIO
Triglycerides: 167 mg/dL — ABNORMAL HIGH (ref <150)
VLDL: 33 mg/dL (ref 0–40)

## 2021-04-04 LAB — I-STAT CHEM 8, ED
BUN: 22 mg/dL (ref 8–23)
Calcium, Ion: 1.18 mmol/L (ref 1.15–1.40)
Chloride: 107 mmol/L (ref 98–111)
Creatinine, Ser: 1.8 mg/dL — ABNORMAL HIGH (ref 0.61–1.24)
Glucose, Bld: 107 mg/dL — ABNORMAL HIGH (ref 70–99)
HCT: 49 % (ref 39.0–52.0)
Hemoglobin: 16.7 g/dL (ref 13.0–17.0)
Potassium: 4 mmol/L (ref 3.5–5.1)
Sodium: 141 mmol/L (ref 135–145)
TCO2: 22 mmol/L (ref 22–32)

## 2021-04-04 LAB — URINALYSIS, ROUTINE W REFLEX MICROSCOPIC
Bilirubin Urine: NEGATIVE
Glucose, UA: NEGATIVE mg/dL
Hgb urine dipstick: NEGATIVE
Ketones, ur: NEGATIVE mg/dL
Leukocytes,Ua: NEGATIVE
Nitrite: NEGATIVE
Protein, ur: NEGATIVE mg/dL
Specific Gravity, Urine: 1.02 (ref 1.005–1.030)
pH: 5 (ref 5.0–8.0)

## 2021-04-04 LAB — APTT: aPTT: 31 seconds (ref 24–36)

## 2021-04-04 LAB — ECHOCARDIOGRAM COMPLETE
Area-P 1/2: 2.62 cm2
Height: 66 in
S' Lateral: 4.1 cm
Single Plane A4C EF: 46.2 %
Weight: 3120 oz

## 2021-04-04 LAB — RESP PANEL BY RT-PCR (FLU A&B, COVID) ARPGX2
Influenza A by PCR: NEGATIVE
Influenza B by PCR: NEGATIVE
SARS Coronavirus 2 by RT PCR: NEGATIVE

## 2021-04-04 LAB — PROTIME-INR
INR: 1.1 (ref 0.8–1.2)
Prothrombin Time: 13.7 seconds (ref 11.4–15.2)

## 2021-04-04 LAB — CBG MONITORING, ED: Glucose-Capillary: 123 mg/dL — ABNORMAL HIGH (ref 70–99)

## 2021-04-04 IMAGING — CT CT ANGIO HEAD-NECK (W OR W/O PERF)
1 of 11 series · 5 of 34 positions shown · IV contrast (APPLIED)
Comparison: None.

CLINICAL DATA: Code stroke.  Ataxia and dysarthria.

EXAM:
CT HEAD WITHOUT CONTRAST
CT ANGIOGRAPHY OF THE HEAD AND NECK
TECHNIQUE: Contiguous axial images were obtained from the base of the skull
through the vertex without intravenous contrast. Multidetector CT
imaging of the head and neck was performed using the standard
protocol during bolus administration of intravenous contrast.
Multiplanar CT image reconstructions and MIPs were obtained to
evaluate the vascular anatomy. Carotid stenosis measurements (when
applicable) are obtained utilizing NASCET criteria, using the distal
internal carotid diameter as the denominator.
CONTRAST:  75mL OMNIPAQUE IOHEXOL 350 MG/ML SOLN

[Series 7: ax thins · axial · 0.39mm/px · z∈[+1274,+1506]mm · 5 of 348 slices shown]
[im 58/348  soft-tissue]
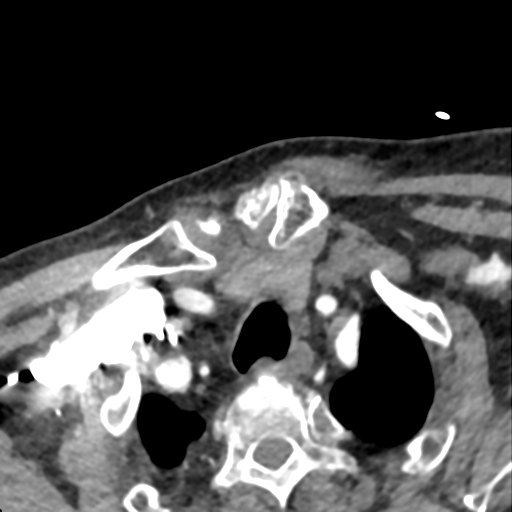
[im 116/348  bone]
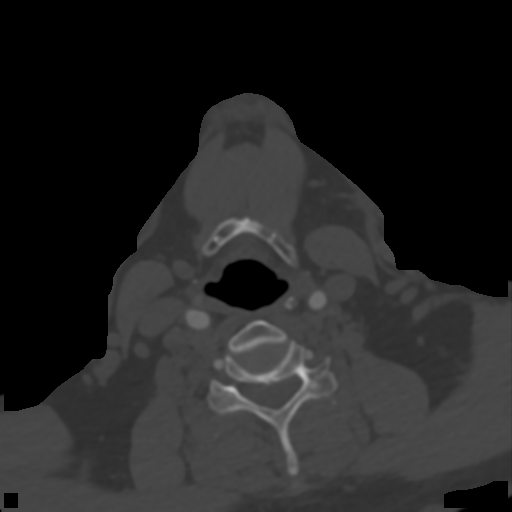
[im 174/348  soft-tissue]
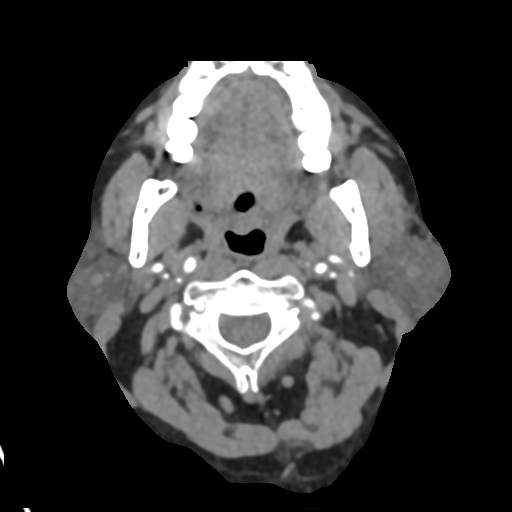
[im 232/348  bone]
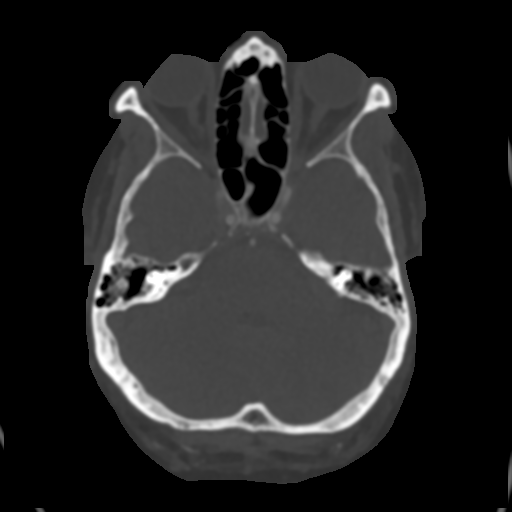
[im 290/348  soft-tissue]
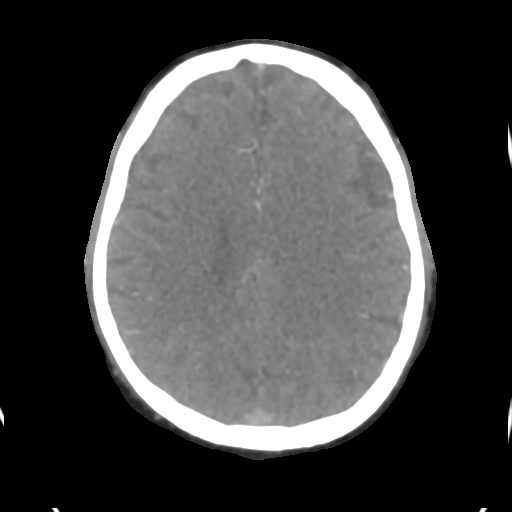

[5 of 34 positions shown; findings below may reference images not displayed]

FINDINGS: CT HEAD FINDINGS

Brain: There is no mass, hemorrhage or extra-axial collection. The
size and configuration of the ventricles and extra-axial CSF spaces
are normal. The brain parenchyma is normal, without evidence of
acute or chronic infarction.

Vascular: No abnormal hyperdensity of the major intracranial
arteries or dural venous sinuses. No intracranial atherosclerosis.

Skull: The visualized skull base, calvarium and extracranial soft
tissues are normal.

Sinuses/Orbits: No fluid levels or advanced mucosal thickening of
the visualized paranasal sinuses. No mastoid or middle ear effusion.
The orbits are normal.

ASPECTS (Alberta Stroke Program Early CT Score)

- Ganglionic level infarction (caudate, lentiform nuclei, internal
capsule, insula, M1-M3 cortex): 7

- Supraganglionic infarction (M4-M6 cortex): 3

Total score (0-10 with 10 being normal): 10

CTA NECK FINDINGS

SKELETON: There is no bony spinal canal stenosis. No lytic or
blastic lesion.

OTHER NECK: Normal pharynx, larynx and major salivary glands. No
cervical lymphadenopathy. Unremarkable thyroid gland.

UPPER CHEST: No pneumothorax or pleural effusion. No nodules or
masses.

AORTIC ARCH:

There is no calcific atherosclerosis of the aortic arch. There is no
aneurysm, dissection or hemodynamically significant stenosis of the
visualized portion of the aorta. Conventional 3 vessel aortic
branching pattern. The visualized proximal subclavian arteries are
widely patent.

RIGHT CAROTID SYSTEM: Normal without aneurysm, dissection or
stenosis.

LEFT CAROTID SYSTEM: Normal without aneurysm, dissection or
stenosis.

VERTEBRAL ARTERIES: Left dominant configuration. Both origins are
clearly patent. There is no dissection, occlusion or flow-limiting
stenosis to the skull base (V1-V3 segments).

CTA HEAD FINDINGS

POSTERIOR CIRCULATION:

--Vertebral arteries: Normal V4 segments.

--Inferior cerebellar arteries: Normal.

--Basilar artery: Normal.

--Superior cerebellar arteries: Normal.

--Posterior cerebral arteries (PCA): Mild distal left PCA narrowing.
Normal right.

ANTERIOR CIRCULATION:

--Intracranial internal carotid arteries: Normal.

--Anterior cerebral arteries (ACA): Normal. Both A1 segments are
present. Patent anterior communicating artery (a-comm).

--Middle cerebral arteries (MCA): Normal.

VENOUS SINUSES: As permitted by contrast timing, patent.

ANATOMIC VARIANTS: Hypoplastic left ACA A1 segment, a common
variant.

Review of the MIP images confirms the above findings.
IMPRESSION: 1. No intracranial hemorrhage.  ASPECTS is 10.
2. No emergent large vessel occlusion or hemodynamically significant
stenosis by NASCET criteria.
3. Normal carotid and vertebral arteries.

These results were called by telephone at the time of interpretation
on [DATE] at [DATE] to provider PRAVIN , who verbally
acknowledged these results.

## 2021-04-04 IMAGING — CT CT HEAD CODE STROKE
4 series · 15 of 47 positions shown, 17 images · IV contrast (omnipaque)
Comparison: None.

CLINICAL DATA: Code stroke.  Ataxia and dysarthria.

EXAM:
CT HEAD WITHOUT CONTRAST
CT ANGIOGRAPHY OF THE HEAD AND NECK
TECHNIQUE: Contiguous axial images were obtained from the base of the skull
through the vertex without intravenous contrast. Multidetector CT
imaging of the head and neck was performed using the standard
protocol during bolus administration of intravenous contrast.
Multiplanar CT image reconstructions and MIPs were obtained to
evaluate the vascular anatomy. Carotid stenosis measurements (when
applicable) are obtained utilizing NASCET criteria, using the distal
internal carotid diameter as the denominator.
CONTRAST:  75mL OMNIPAQUE IOHEXOL 350 MG/ML SOLN

[Series 4: head wo · axial · 0.39mm/px · z∈[+1412,+1552]mm · 7 of 38 slices shown, 9 images]
[im 5/38  brain]
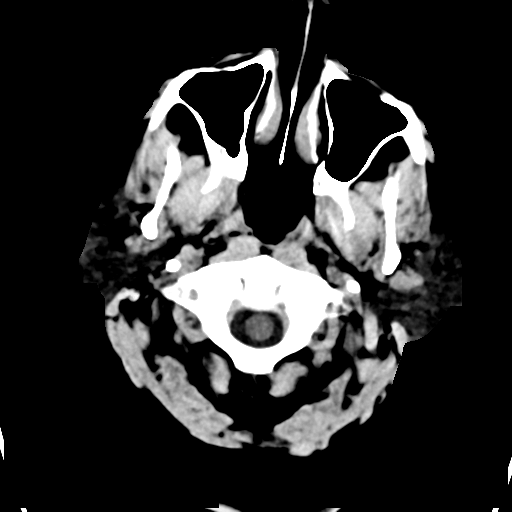
[im 5/38  bone]
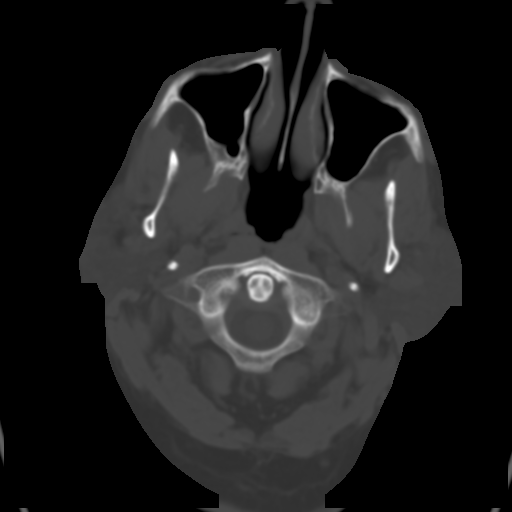
[im 10/38  brain]
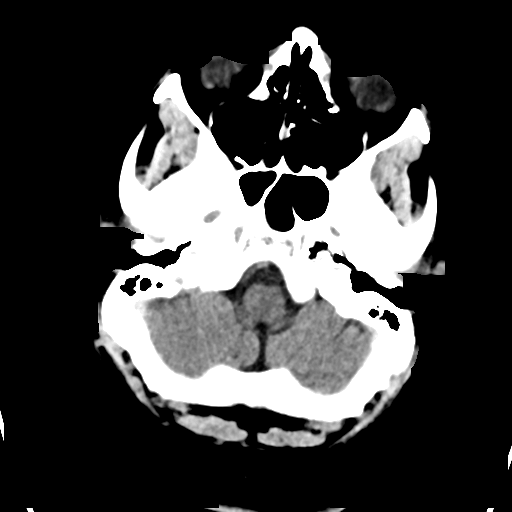
[im 14/38  brain]
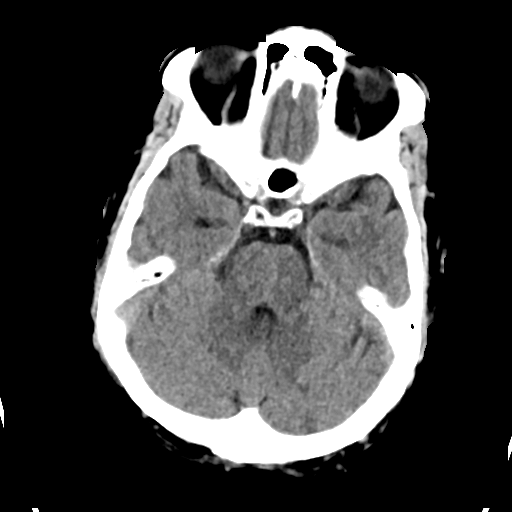
[im 19/38  brain]
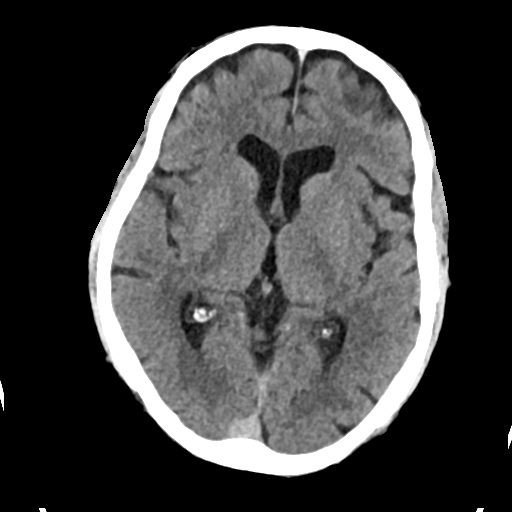
[im 24/38  brain]
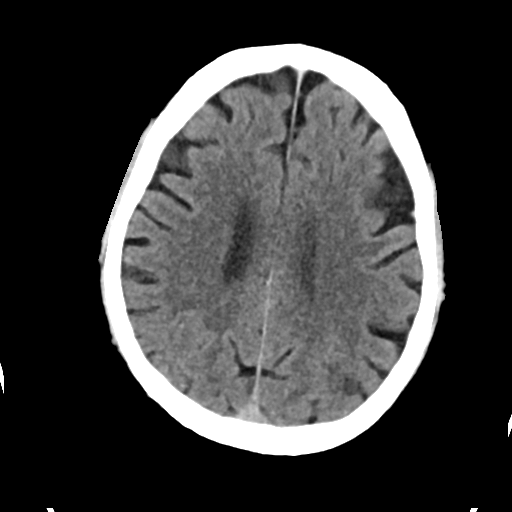
[im 24/38  bone]
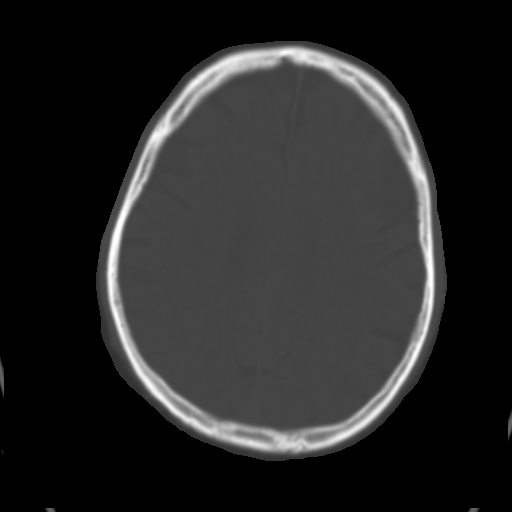
[im 28/38  brain]
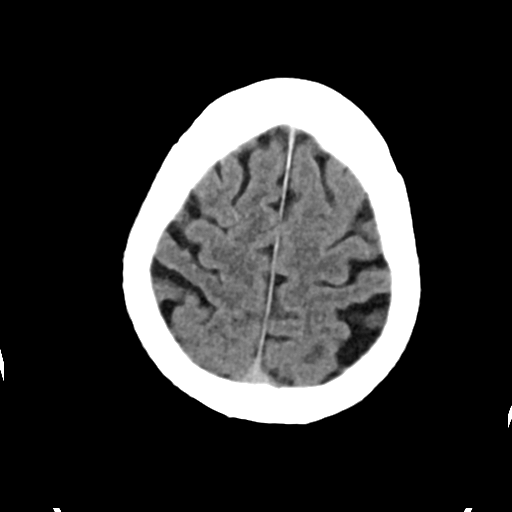
[im 33/38  brain]
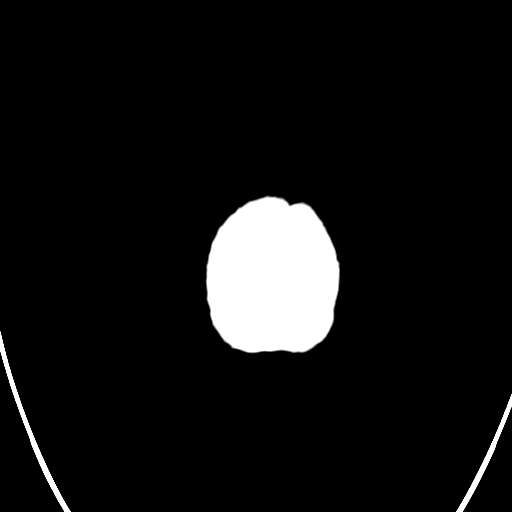

[Series 5: head bone · axial · 0.39mm/px · z∈[+1410,+1428]mm · 2 of 93 slices shown]
[im 10/93  bone]
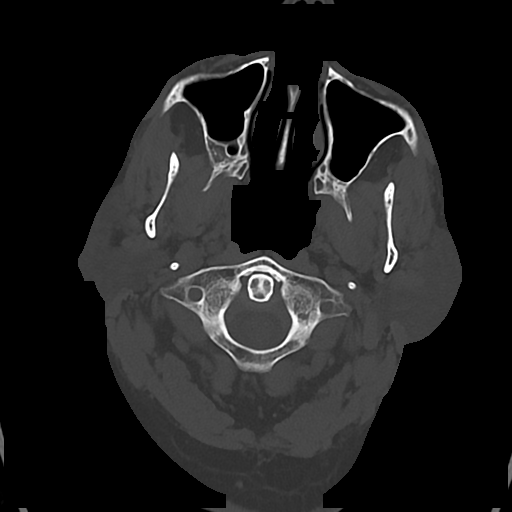
[im 19/93  bone]
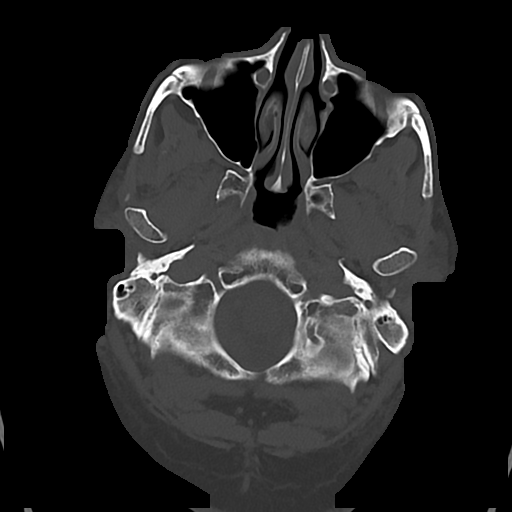

[Series 6: cor soft · coronal · 0.34mm/px · 3 of 71 slices shown]
[im 24/71  brain]
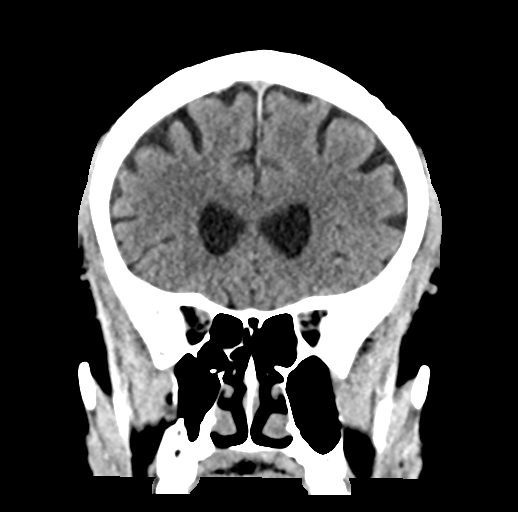
[im 32/71  brain]
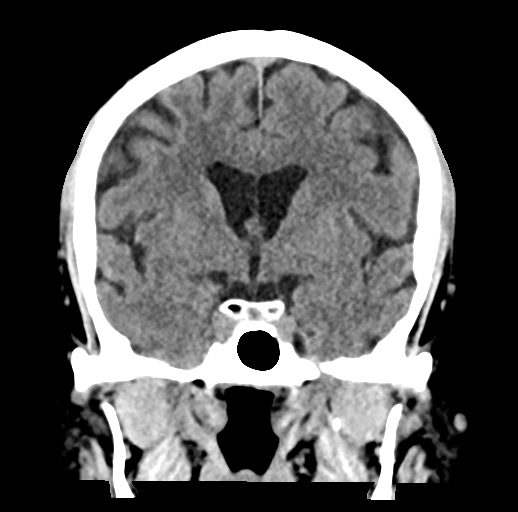
[im 39/71  brain]
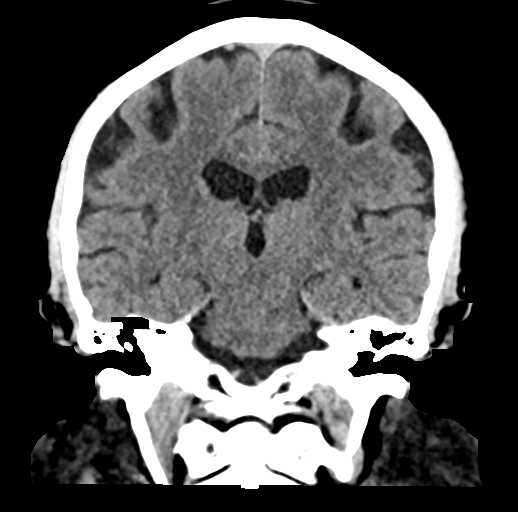

[Series 7: sag soft · sagittal · 0.34mm/px · 3 of 59 slices shown]
[im 20/59  brain]
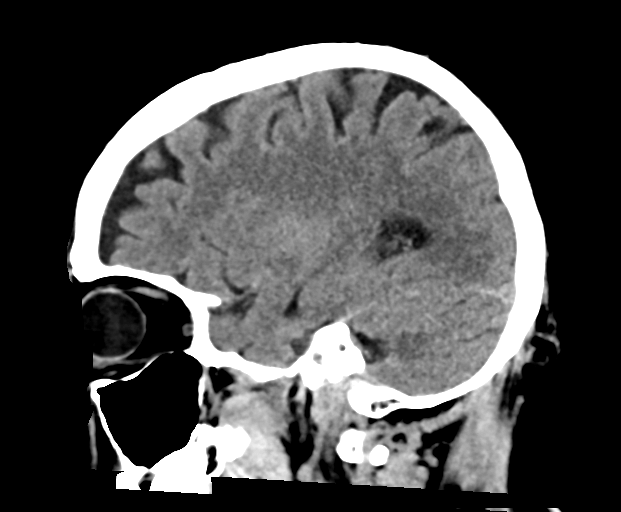
[im 30/59  brain]
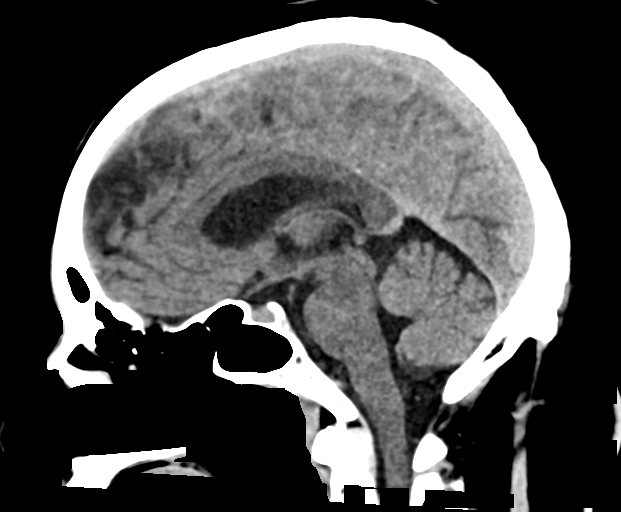
[im 39/59  brain]
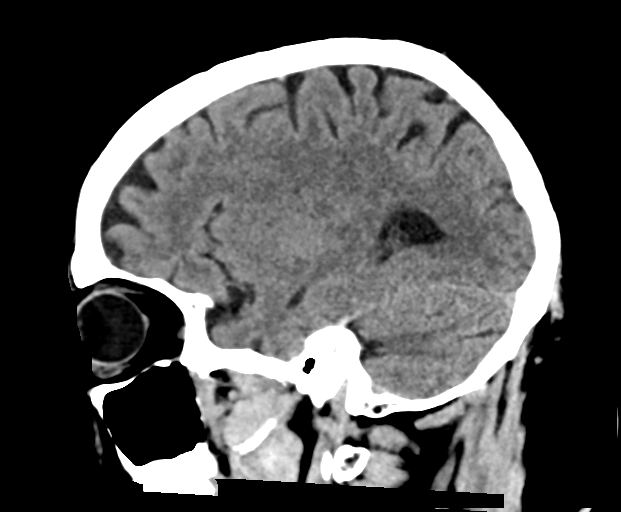

[15 of 47 positions shown; findings below may reference images not displayed]

FINDINGS: CT HEAD FINDINGS

Brain: There is no mass, hemorrhage or extra-axial collection. The
size and configuration of the ventricles and extra-axial CSF spaces
are normal. The brain parenchyma is normal, without evidence of
acute or chronic infarction.

Vascular: No abnormal hyperdensity of the major intracranial
arteries or dural venous sinuses. No intracranial atherosclerosis.

Skull: The visualized skull base, calvarium and extracranial soft
tissues are normal.

Sinuses/Orbits: No fluid levels or advanced mucosal thickening of
the visualized paranasal sinuses. No mastoid or middle ear effusion.
The orbits are normal.

ASPECTS (Alberta Stroke Program Early CT Score)

- Ganglionic level infarction (caudate, lentiform nuclei, internal
capsule, insula, M1-M3 cortex): 7

- Supraganglionic infarction (M4-M6 cortex): 3

Total score (0-10 with 10 being normal): 10

CTA NECK FINDINGS

SKELETON: There is no bony spinal canal stenosis. No lytic or
blastic lesion.

OTHER NECK: Normal pharynx, larynx and major salivary glands. No
cervical lymphadenopathy. Unremarkable thyroid gland.

UPPER CHEST: No pneumothorax or pleural effusion. No nodules or
masses.

AORTIC ARCH:

There is no calcific atherosclerosis of the aortic arch. There is no
aneurysm, dissection or hemodynamically significant stenosis of the
visualized portion of the aorta. Conventional 3 vessel aortic
branching pattern. The visualized proximal subclavian arteries are
widely patent.

RIGHT CAROTID SYSTEM: Normal without aneurysm, dissection or
stenosis.

LEFT CAROTID SYSTEM: Normal without aneurysm, dissection or
stenosis.

VERTEBRAL ARTERIES: Left dominant configuration. Both origins are
clearly patent. There is no dissection, occlusion or flow-limiting
stenosis to the skull base (V1-V3 segments).

CTA HEAD FINDINGS

POSTERIOR CIRCULATION:

--Vertebral arteries: Normal V4 segments.

--Inferior cerebellar arteries: Normal.

--Basilar artery: Normal.

--Superior cerebellar arteries: Normal.

--Posterior cerebral arteries (PCA): Mild distal left PCA narrowing.
Normal right.

ANTERIOR CIRCULATION:

--Intracranial internal carotid arteries: Normal.

--Anterior cerebral arteries (ACA): Normal. Both A1 segments are
present. Patent anterior communicating artery (a-comm).

--Middle cerebral arteries (MCA): Normal.

VENOUS SINUSES: As permitted by contrast timing, patent.

ANATOMIC VARIANTS: Hypoplastic left ACA A1 segment, a common
variant.

Review of the MIP images confirms the above findings.
IMPRESSION: 1. No intracranial hemorrhage.  ASPECTS is 10.
2. No emergent large vessel occlusion or hemodynamically significant
stenosis by NASCET criteria.
3. Normal carotid and vertebral arteries.

These results were called by telephone at the time of interpretation
on [DATE] at [DATE] to provider PRAVIN , who verbally
acknowledged these results.

## 2021-04-04 IMAGING — MR MR HEAD W/O CM
10 of 11 series · 43 of 48 positions shown · non-contrast
Comparison: CTA head neck [DATE]

CLINICAL DATA: Stroke follow-up

EXAM:
MRI HEAD WITHOUT CONTRAST
TECHNIQUE: Multiplanar, multiecho pulse sequences of the brain and surrounding
structures were obtained without intravenous contrast.

[Series 5: DWI · axial · 3.0mm · 0.88mm/px · z∈[-74,+72]mm · 10 of 100 slices shown (1 of 4)]
[im 1/100]
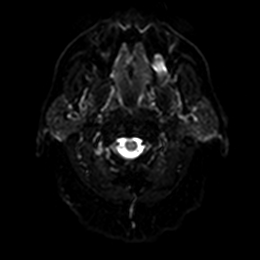
[im 12/100]
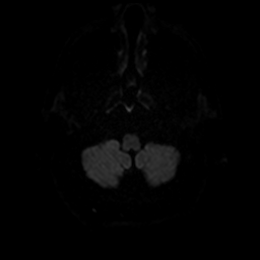
[im 23/100]
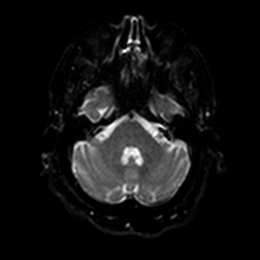
[im 34/100]
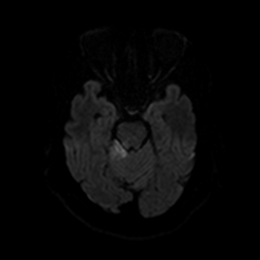
[im 45/100]
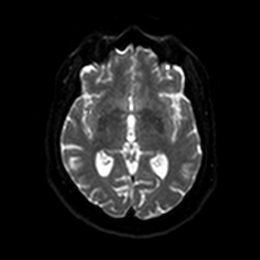
[im 56/100]
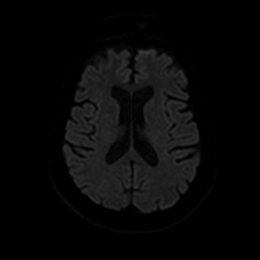
[im 67/100]
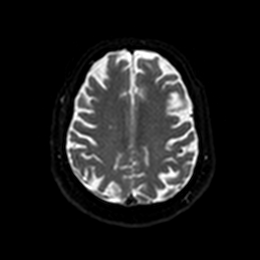
[im 78/100]
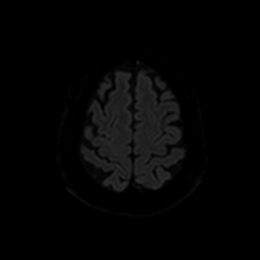
[im 89/100]
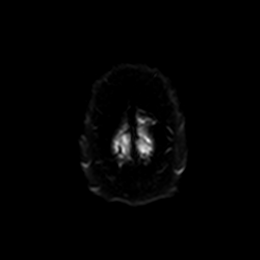
[im 100/100]
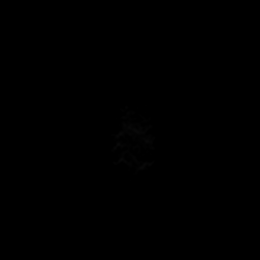

[Series 6: DWI · axial · 3.0mm · 0.88mm/px · z∈[-74,+72]mm · 5 of 50 slices shown (2 of 4)]
[im 1/50]
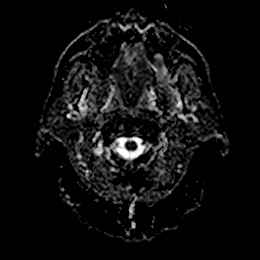
[im 13/50]
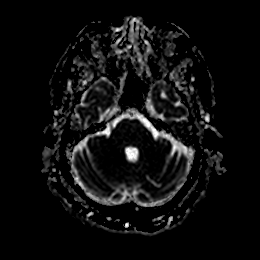
[im 25/50]
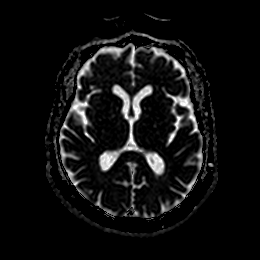
[im 37/50]
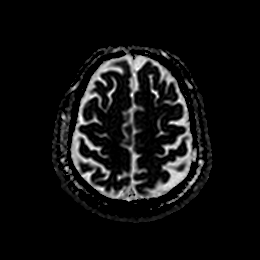
[im 50/50]
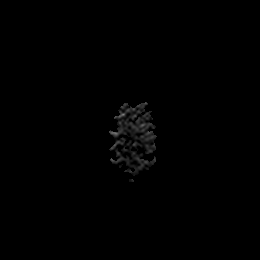

[Series 7: DWI · coronal · 4.0mm · 0.88mm/px · 6 of 64 slices shown (3 of 4)]
[im 1/64]
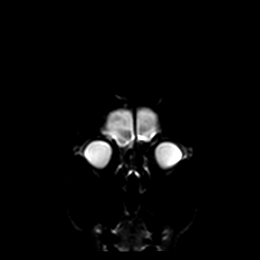
[im 13/64]
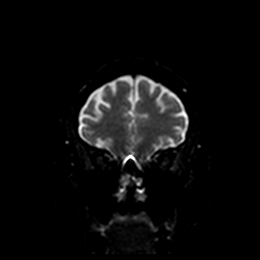
[im 26/64]
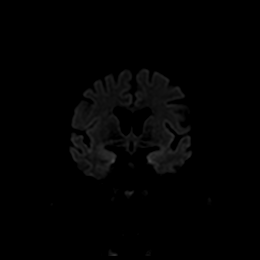
[im 38/64]
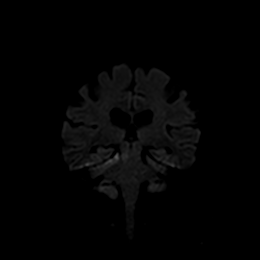
[im 51/64]
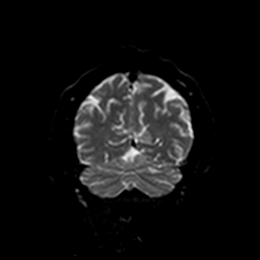
[im 64/64]
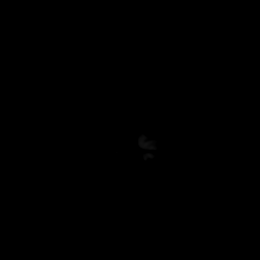

[Series 8: DWI · coronal · 4.0mm · 0.88mm/px · 3 of 32 slices shown (4 of 4)]
[im 1/32]
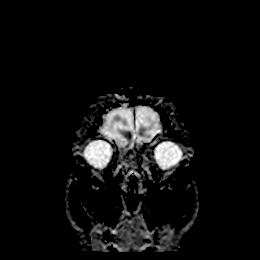
[im 16/32]
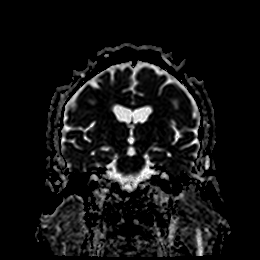
[im 32/32]
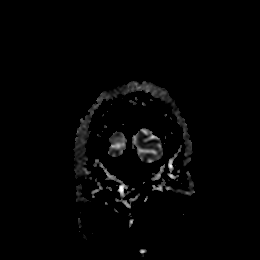

[Series 9: T1 · sagittal · 5.0mm · 0.75mm/px · 2 of 23 slices shown]
[im 1/23]
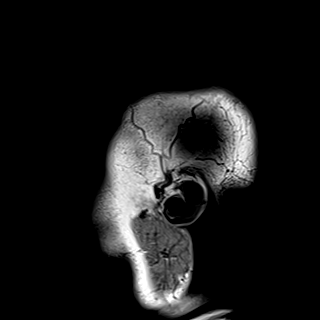
[im 23/23]
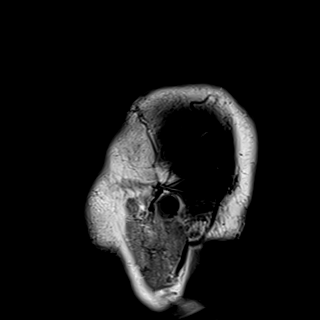

[Series 10: T2 · axial · 5.0mm · 0.72mm/px · z∈[-73,+71]mm · 2 of 25 slices shown (1 of 2)]
[im 1/25]
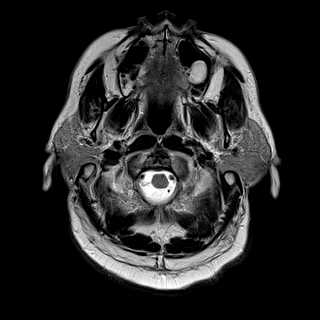
[im 25/25]
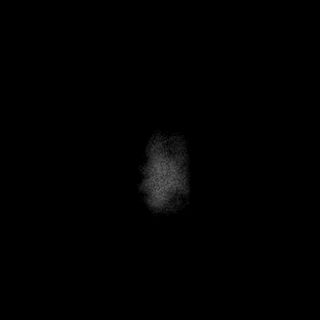

[Series 11: FLAIR · axial · 5.0mm · 0.45mm/px · z∈[-73,+71]mm · 2 of 25 slices shown]
[im 1/25]
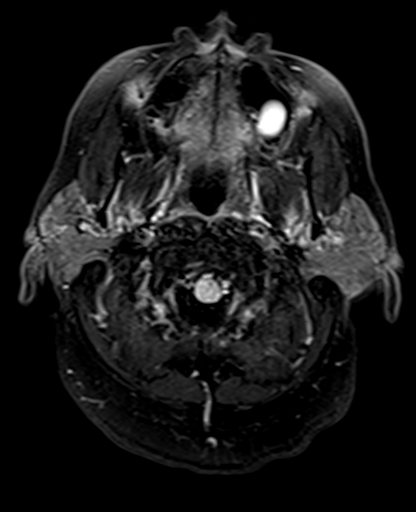
[im 25/25]
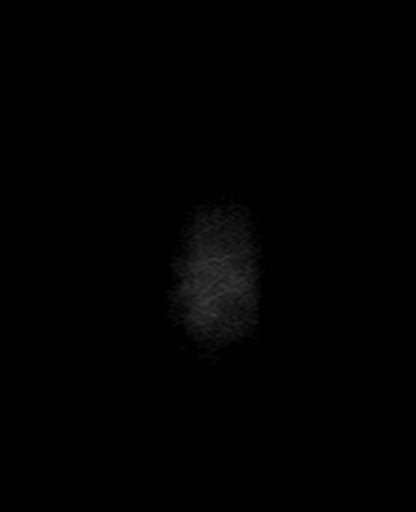

[Series 13: pha_images · axial · 3.0mm · 0.90mm/px · z∈[-89,+78]mm · 5 of 57 slices shown]
[im 1/57]
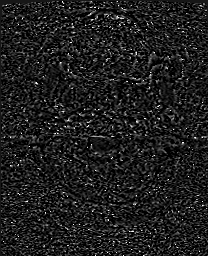
[im 15/57]
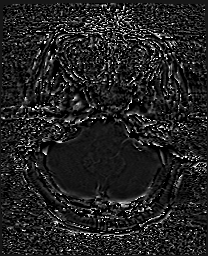
[im 29/57]
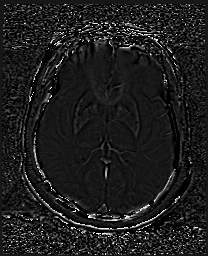
[im 43/57]
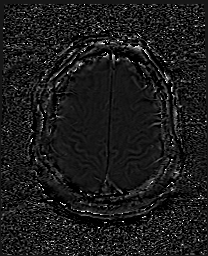
[im 57/57]
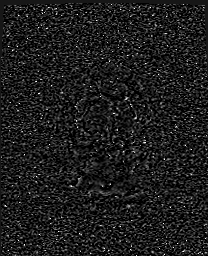

[Series 14: swi_images · axial · 3.0mm · 0.90mm/px · z∈[-89,+87]mm · 5 of 60 slices shown]
[im 1/60]
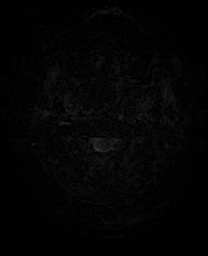
[im 15/60]
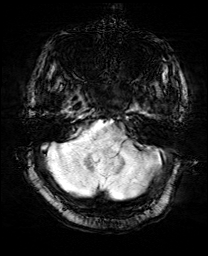
[im 30/60]
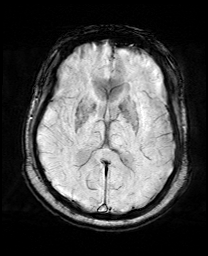
[im 45/60]
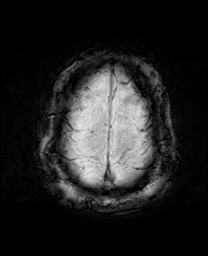
[im 60/60]
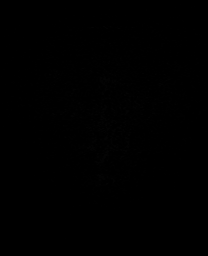

[Series 17: T2 · coronal · 5.0mm · 0.34mm/px · 3 of 29 slices shown (2 of 2)]
[im 1/29]
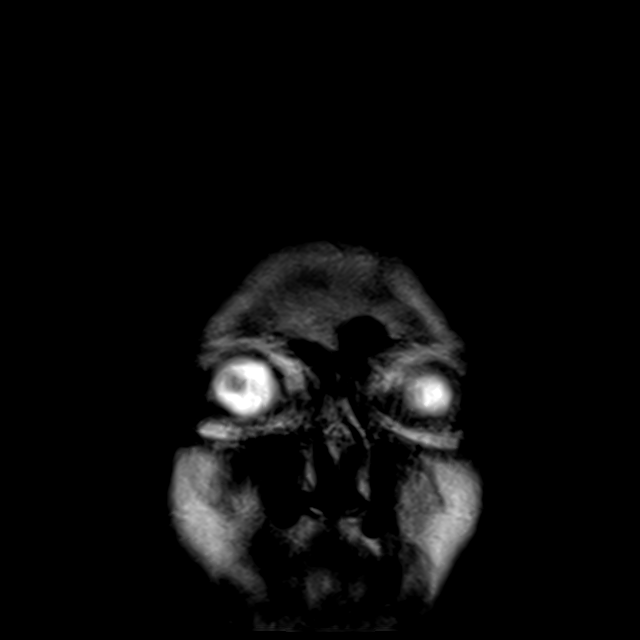
[im 15/29]
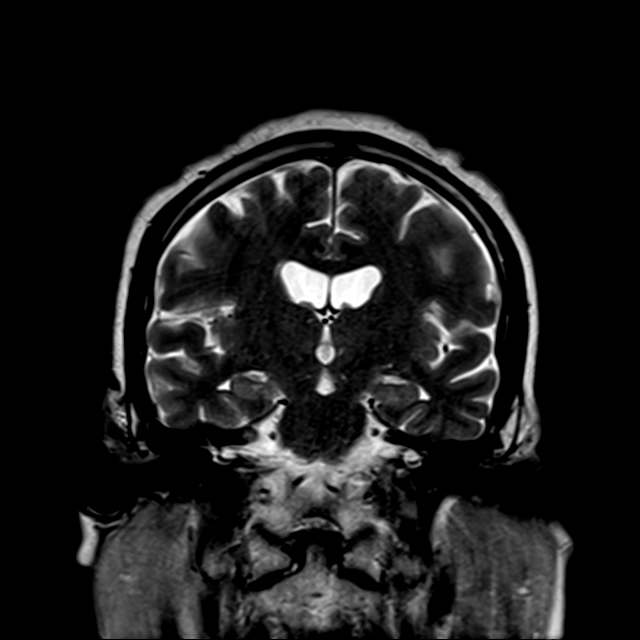
[im 29/29]
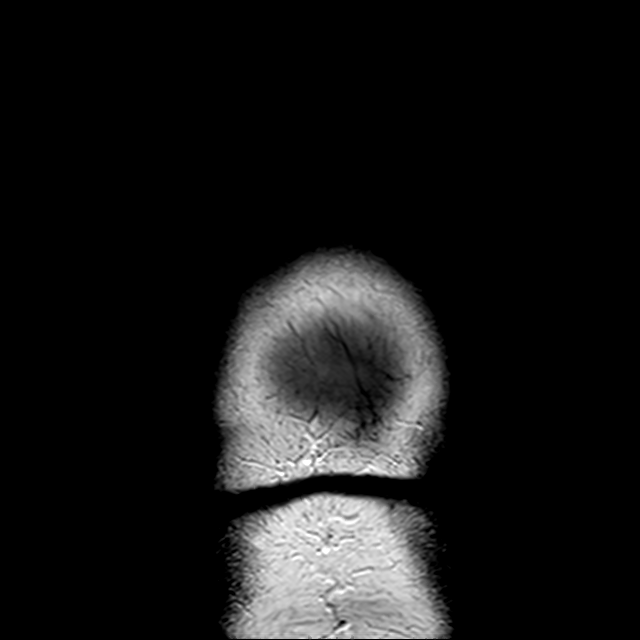

[43 of 48 positions shown; findings below may reference images not displayed]

FINDINGS: Brain: There is a small acute infarct of the anterior superior right
cerebellum. No acute or chronic hemorrhage. Normal white matter
signal, parenchymal volume and CSF spaces. The midline structures
are normal.

Vascular: Major flow voids are preserved.

Skull and upper cervical spine: Normal calvarium and skull base.
Visualized upper cervical spine and soft tissues are normal.

Sinuses/Orbits:No paranasal sinus fluid levels or advanced mucosal
thickening. No mastoid or middle ear effusion. Normal orbits.
IMPRESSION: Small acute infarct of the anterior superior right cerebellum. No
hemorrhage or mass effect.

## 2021-04-04 MED ORDER — IOHEXOL 350 MG/ML SOLN
75.0000 mL | Freq: Once | INTRAVENOUS | Status: AC | PRN
  Administered 2021-04-04: 75 mL via INTRAVENOUS

## 2021-04-04 MED ORDER — ACETAMINOPHEN 160 MG/5ML PO SOLN: 650.0000 mg | ORAL | Status: DC | PRN

## 2021-04-04 MED ORDER — LATANOPROST 0.005 % OP SOLN
1.0000 [drp] | Freq: Every day | OPHTHALMIC | Status: DC
  Administered 2021-04-04 – 2021-04-05 (×2): 1 [drp] via OPHTHALMIC
  Filled 2021-04-04 (×2): qty 2.5

## 2021-04-04 MED ORDER — ACETAMINOPHEN 650 MG RE SUPP: 650.0000 mg | RECTAL | Status: DC | PRN

## 2021-04-04 MED ORDER — ADULT MULTIVITAMIN W/MINERALS CH
1.0000 | ORAL_TABLET | Freq: Every day | ORAL | Status: DC
  Administered 2021-04-04 – 2021-04-06 (×3): 1 via ORAL
  Filled 2021-04-04 (×3): qty 1

## 2021-04-04 MED ORDER — TAMSULOSIN HCL 0.4 MG PO CAPS
0.8000 mg | ORAL_CAPSULE | Freq: Every evening | ORAL | Status: DC
  Administered 2021-04-04 – 2021-04-05 (×2): 0.8 mg via ORAL
  Filled 2021-04-04 (×2): qty 2

## 2021-04-04 MED ORDER — SALINE SPRAY 0.65 % NA SOLN
1.0000 | NASAL | Status: DC | PRN
  Filled 2021-04-04: qty 44

## 2021-04-04 MED ORDER — DORZOLAMIDE HCL-TIMOLOL MAL 2-0.5 % OP SOLN
1.0000 [drp] | Freq: Two times a day (BID) | OPHTHALMIC | Status: DC
  Administered 2021-04-04 – 2021-04-06 (×5): 1 [drp] via OPHTHALMIC
  Filled 2021-04-04: qty 10

## 2021-04-04 MED ORDER — ASPIRIN 325 MG PO TABS
650.0000 mg | ORAL_TABLET | Freq: Once | ORAL | Status: AC
  Administered 2021-04-04: 650 mg via ORAL
  Filled 2021-04-04: qty 2

## 2021-04-04 MED ORDER — PANTOPRAZOLE SODIUM 40 MG PO TBEC
40.0000 mg | DELAYED_RELEASE_TABLET | Freq: Every day | ORAL | Status: DC
  Administered 2021-04-04 – 2021-04-06 (×3): 40 mg via ORAL
  Filled 2021-04-04 (×3): qty 1

## 2021-04-04 MED ORDER — ATORVASTATIN CALCIUM 80 MG PO TABS
80.0000 mg | ORAL_TABLET | Freq: Every day | ORAL | Status: DC
  Administered 2021-04-04 – 2021-04-05 (×2): 80 mg via ORAL
  Filled 2021-04-04 (×2): qty 1

## 2021-04-04 MED ORDER — SENNOSIDES-DOCUSATE SODIUM 8.6-50 MG PO TABS: 1.0000 | ORAL_TABLET | Freq: Every evening | ORAL | Status: DC | PRN

## 2021-04-04 MED ORDER — ACETAMINOPHEN 325 MG PO TABS: 650.0000 mg | ORAL_TABLET | ORAL | Status: DC | PRN

## 2021-04-04 MED ORDER — STROKE: EARLY STAGES OF RECOVERY BOOK
Freq: Once | Status: AC
  Filled 2021-04-04: qty 1

## 2021-04-04 MED ORDER — RANOLAZINE ER 500 MG PO TB12
1000.0000 mg | ORAL_TABLET | Freq: Every day | ORAL | Status: DC
  Administered 2021-04-04 – 2021-04-06 (×3): 1000 mg via ORAL
  Filled 2021-04-04 (×3): qty 2

## 2021-04-04 MED ORDER — CLOPIDOGREL BISULFATE 75 MG PO TABS
75.0000 mg | ORAL_TABLET | Freq: Every day | ORAL | Status: DC
  Administered 2021-04-04 – 2021-04-06 (×3): 75 mg via ORAL
  Filled 2021-04-04 (×3): qty 1

## 2021-04-04 MED ORDER — ENOXAPARIN SODIUM 40 MG/0.4ML IJ SOSY
40.0000 mg | PREFILLED_SYRINGE | INTRAMUSCULAR | Status: DC
  Administered 2021-04-04 – 2021-04-06 (×3): 40 mg via SUBCUTANEOUS
  Filled 2021-04-04 (×3): qty 0.4

## 2021-04-04 MED ORDER — LACTATED RINGERS IV SOLN: INTRAVENOUS | Status: DC

## 2021-04-04 MED ORDER — ASPIRIN EC 81 MG PO TBEC
81.0000 mg | DELAYED_RELEASE_TABLET | Freq: Every evening | ORAL | Status: DC
  Administered 2021-04-04 – 2021-04-05 (×2): 81 mg via ORAL
  Filled 2021-04-04 (×2): qty 1

## 2021-04-04 MED ORDER — BRIMONIDINE TARTRATE 0.2 % OP SOLN
1.0000 [drp] | Freq: Three times a day (TID) | OPHTHALMIC | Status: DC
  Administered 2021-04-04 – 2021-04-06 (×7): 1 [drp] via OPHTHALMIC
  Filled 2021-04-04: qty 5

## 2021-04-04 NOTE — ED Provider Notes (Signed)
[facility] EMERGENCY DEPARTMENT Provider Note   CSN: [id number] Arrival date & time: 04/03/21  2334     History Chief Complaint  Patient presents with  . Dizziness    Bryan Crane is a 75 y.o. male.  75 yo M with a cc of dizziness.  Patient was at a dinner party.  Stood up and felt dizzy.  Had trouble walking.  Feels like his left arm is a bit weak.  Also feels like his speech is off.    The wife that this started at 10:10 PM.  The patient has had persistent symptoms since then.  Having a little bit of a headache.  Brought here to the ED.  He denies head injury denies neck pain.  Denies prior history of stroke.  The history is provided by the patient.  Dizziness Quality:  Lightheadedness Severity:  Severe Onset quality:  Sudden Duration:  2 hours Timing:  Constant Progression:  Unchanged Chronicity:  New Context: standing up   Relieved by:  Nothing Worsened by:  Nothing Ineffective treatments:  None tried Associated symptoms: weakness (left upper extremity)   Associated symptoms: no chest pain, no diarrhea, no headaches, no palpitations, no shortness of breath and no vomiting        Past Medical History:  Diagnosis Date  . Arthritis   . Cardiomyopathy (HCC)   . CKD (chronic kidney disease)   . Dyspnea    with exertion  . Enlarged prostate   . GERD (gastroesophageal reflux disease)   . History of kidney stones   . Hypertension   . Sleep apnea    uses cpap    Patient Active Problem List   Diagnosis Date Noted  . 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 Dysarthria 04/04/2021  . Essential hypertension 04/04/2021  . CKD (chronic kidney disease) stage 3, GFR 30-59 ml/min (HCC) 04/04/2021  . Closed fracture of left olecranon process 09/30/2018    Past Surgical History:  Procedure Laterality Date  . CARDIAC CATHETERIZATION     "years ago" - states it was clear  . COLONOSCOPY    . KIDNEY STONE SURGERY     . ORIF ELBOW FRACTURE Left 09/30/2018   Procedure: Open reduction internal fixation left elbow fracture with repair reconstruction and  allograft;  Surgeon: Dominica Severin, MD;  Location: [facility] OR;  Service: Orthopedics;  Laterality: Left;  90 mins  . TONSILLECTOMY         History reviewed. No pertinent family history.  Social History   Tobacco Use  . Smoking status: Never Smoker  . Smokeless tobacco: Never Used  Vaping Use  . Vaping Use: Never used  Substance Use Topics  . Alcohol use: No  . Drug use: No    Home Medications Prior to Admission medications   Medication Sig Start Date End Date Taking? Authorizing Provider  acetaminophen (TYLENOL) 500 MG tablet Take 500 mg by mouth 2 (two) times daily as needed for moderate pain or headache.   Yes [provider]  brimonidine (ALPHAGAN) 0.2 % ophthalmic solution Place 1 drop into both eyes in the morning and at bedtime. 04/01/21  Yes [provider]  dorzolamide-timolol (COSOPT) 22.3-6.8 MG/ML ophthalmic solution Place 1 drop into both eyes 2 (two) times daily. 02/25/21  Yes [provider]  lansoprazole (PREVACID) 30 MG capsule Take 30 mg by mouth daily.   Yes [provider]  latanoprost (XALATAN) 0.005 % ophthalmic solution Place 1 drop into both eyes  at bedtime. 08/11/18  Yes [provider]  Multiple Vitamin (MULTIVITAMIN WITH MINERALS) TABS tablet Take 1 tablet by mouth daily.   Yes [provider]  perindopril (ACEON) 8 MG tablet Take 8 mg by mouth daily. 04/02/21  Yes [provider]  Probiotic Product (PROBIOTIC DAILY PO) Take 1 capsule by mouth daily.   Yes [provider]  RANEXA 1000 MG SR tablet Take 1,000 mg by mouth at bedtime. 03/18/15  Yes [provider]  sodium chloride (OCEAN) 0.65 % SOLN nasal spray Place 1 spray into both nostrils as needed for congestion.   Yes [provider]  spironolactone (ALDACTONE) 25 MG tablet Take 25 mg by  mouth every Monday, Wednesday, and Friday. 03/05/21  Yes [provider]  tamsulosin (FLOMAX) 0.4 MG CAPS capsule Take 0.8 mg by mouth every evening.   Yes [provider]  amoxicillin-clavulanate (AUGMENTIN) 875-125 MG tablet Take 1 tablet by mouth See admin instructions. Bid x 7 days Patient not taking: No sig reported 03/18/21   [provider]    Allergies    Patient has no known allergies.  Review of Systems   Review of Systems  Constitutional: Negative for chills and fever.  HENT: Negative for congestion and facial swelling.   Eyes: Negative for discharge and visual disturbance.  Respiratory: Negative for shortness of breath.   Cardiovascular: Negative for chest pain and palpitations.  Gastrointestinal: Negative for abdominal pain, diarrhea and vomiting.  Musculoskeletal: Negative for arthralgias and myalgias.  Skin: Negative for color change and rash.  Neurological: Positive for dizziness, speech difficulty and weakness (left upper extremity). Negative for tremors, syncope and headaches.  Psychiatric/Behavioral: Negative for confusion and dysphoric mood.    Physical Exam Updated Vital Signs BP (!) 180/79   Pulse 64   Temp 97.9 F (36.6 C) (Oral)   Resp 15   Ht 5\' 6"  (1.676 m)   Wt 88.5 kg   SpO2 99%   BMI 31.47 kg/m   Physical Exam Vitals and nursing note reviewed.  Constitutional:      Appearance: He is well-developed.  HENT:     Head: Normocephalic and atraumatic.  Eyes:     Pupils: Pupils are equal, round, and reactive to light.  Neck:     Vascular: No JVD.  Cardiovascular:     Rate and Rhythm: Normal rate and regular rhythm.     Heart sounds: No murmur heard. No friction rub. No gallop.   Pulmonary:     Effort: No respiratory distress.     Breath sounds: No wheezing.  Abdominal:     General: There is no distension.     Tenderness: There is no guarding or rebound.  Musculoskeletal:        General: Normal range of motion.      Cervical back: Normal range of motion and neck supple.  Skin:    Coloration: Skin is not pale.     Findings: No rash.  Neurological:     Mental Status: He is alert and oriented to person, place, and time.     GCS: GCS eye subscore is 4. GCS verbal subscore is 5. GCS motor subscore is 6.     Comments: Asymmetric palate elevation.  Slight left upper extremity weakness compared to right.  4 out of 5.  Wavering finger-to-nose with past-pointing in both upper extremities worse on the right than the left.  Psychiatric:        Behavior: Behavior normal.  ED Results / Procedures / Treatments   Labs (all labs ordered are listed, but only abnormal results are displayed) Labs Reviewed  COMPREHENSIVE METABOLIC PANEL - Abnormal; Notable for the following components:      Result Value   Glucose, Bld 107 (*)    Creatinine, Ser 1.83 (*)    GFR, Estimated 38 (*)    All other components within normal limits  URINALYSIS, ROUTINE W REFLEX MICROSCOPIC - Abnormal; Notable for the following components:   Color, Urine STRAW (*)    All other components within normal limits  I-STAT CHEM 8, ED - Abnormal; Notable for the following components:   Creatinine, Ser 1.80 (*)    Glucose, Bld 107 (*)    All other components within normal limits  CBG MONITORING, ED - Abnormal; Notable for the following components:   Glucose-Capillary 123 (*)    All other components within normal limits  RESP PANEL BY RT-PCR (FLU A&B, COVID) ARPGX2  ETHANOL  PROTIME-INR  APTT  CBC  DIFFERENTIAL  RAPID URINE DRUG SCREEN, HOSP PERFORMED    EKG EKG Interpretation  Date/Time:  Friday April 03 2021 23:35:18 EDT Ventricular Rate:  60 PR Interval:  325 QRS Duration: 159 QT Interval:  455 QTC Calculation: 455 R Axis:   136 Text Interpretation: Sinus rhythm Prolonged PR interval Probable left atrial enlargement Right bundle branch block Probable anterolateral infarct, age indeterm No old tracing to compare Confirmed by  Melene Plan [phone]) on 04/03/2021 11:47:45 PM   Radiology MR BRAIN WO CONTRAST  Result Date: 04/04/2021 CLINICAL DATA:  Stroke follow-up EXAM: MRI HEAD WITHOUT CONTRAST TECHNIQUE: Multiplanar, multiecho pulse sequences of the brain and surrounding structures were obtained without intravenous contrast. COMPARISON:  CTA head neck 04/04/2021 FINDINGS: Brain: There is a small acute infarct of the anterior superior right cerebellum. No acute or chronic hemorrhage. Normal white matter signal, parenchymal volume and CSF spaces. The midline structures are normal. Vascular: Major flow voids are preserved. Skull and upper cervical spine: Normal calvarium and skull base. Visualized upper cervical spine and soft tissues are normal. Sinuses/Orbits:No paranasal sinus fluid levels or advanced mucosal thickening. No mastoid or middle ear effusion. Normal orbits. 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 No hemorrhage or mass effect. Electronically Signed   By: Deatra Robinson M.D.   On: 04/04/2021 02:32   CT HEAD CODE STROKE WO CONTRAST  Result Date: 04/04/2021 CLINICAL DATA:  Code stroke.  Ataxia and dysarthria. EXAM: CT HEAD WITHOUT CONTRAST CT ANGIOGRAPHY OF THE HEAD AND NECK TECHNIQUE: Contiguous axial images were obtained from the base of the skull through the vertex without intravenous contrast. Multidetector CT imaging of the head and neck was performed using the standard protocol during bolus administration of intravenous contrast. Multiplanar CT image reconstructions and MIPs were obtained to evaluate the vascular anatomy. Carotid stenosis measurements (when applicable) are obtained utilizing NASCET criteria, using the distal internal carotid diameter as the denominator. CONTRAST:  62mL OMNIPAQUE IOHEXOL 350 MG/ML SOLN COMPARISON:  None. FINDINGS: CT HEAD FINDINGS Brain: There is no mass, hemorrhage or extra-axial collection. The size and configuration of the ventricles and extra-axial  CSF spaces are normal. The brain parenchyma is normal, without evidence of acute or chronic infarction. Vascular: No abnormal hyperdensity of the major intracranial arteries or dural venous sinuses. No intracranial atherosclerosis. Skull: The visualized skull base, calvarium and extracranial soft tissues are normal. Sinuses/Orbits: No fluid levels or advanced mucosal thickening of the visualized paranasal sinuses. No mastoid or middle  ear effusion. The orbits are normal. ASPECTS (Alberta Stroke Program Early CT Score) - Ganglionic level infarction (caudate, lentiform nuclei, internal capsule, insula, M1-M3 cortex): 7 - Supraganglionic infarction (M4-M6 cortex): 3 Total score (0-10 with 10 being normal): 10 CTA NECK FINDINGS SKELETON: There is no bony spinal canal stenosis. No lytic or blastic lesion. OTHER NECK: Normal pharynx, larynx and major salivary glands. No cervical lymphadenopathy. Unremarkable thyroid gland. UPPER CHEST: No pneumothorax or pleural effusion. No nodules or masses. AORTIC ARCH: There is no calcific atherosclerosis of the aortic arch. There is no aneurysm, dissection or hemodynamically significant stenosis of the visualized portion of the aorta. Conventional 3 vessel aortic branching pattern. The visualized proximal subclavian arteries are widely patent. RIGHT CAROTID SYSTEM: Normal without aneurysm, dissection or stenosis. LEFT CAROTID SYSTEM: Normal without aneurysm, dissection or stenosis. VERTEBRAL ARTERIES: Left dominant configuration. Both origins are clearly patent. There is no dissection, occlusion or flow-limiting stenosis to the skull base (V1-V3 segments). CTA HEAD FINDINGS POSTERIOR CIRCULATION: --Vertebral arteries: Normal V4 segments. --Inferior cerebellar arteries: Normal. --Basilar artery: Normal. --Superior cerebellar arteries: Normal. --Posterior cerebral arteries (PCA): Mild distal left PCA narrowing. Normal right. ANTERIOR CIRCULATION: --Intracranial internal carotid  arteries: Normal. --Anterior cerebral arteries (ACA): Normal. Both A1 segments are present. Patent anterior communicating artery (a-comm). --Middle cerebral arteries (MCA): Normal. VENOUS SINUSES: As permitted by contrast timing, patent. ANATOMIC VARIANTS: Hypoplastic left ACA A1 segment, a common variant. Review of the MIP images confirms the above findings. IMPRESSION: 1. No intracranial hemorrhage.  ASPECTS is 10. 2. No emergent large vessel occlusion or hemodynamically significant stenosis by NASCET criteria. 3. Normal carotid and vertebral arteries. These results were called by telephone at the time of interpretation on 04/04/2021 at 1:14 am to provider ERIC [facility] , who verbally acknowledged these results. Electronically Signed   By: Deatra Robinson M.D.   On: 04/04/2021 01:18   CT ANGIO HEAD CODE STROKE  Result Date: 04/04/2021 CLINICAL DATA:  Code stroke.  Ataxia and dysarthria. EXAM: CT HEAD WITHOUT CONTRAST CT ANGIOGRAPHY OF THE HEAD AND NECK TECHNIQUE: Contiguous axial images were obtained from the base of the skull through the vertex without intravenous contrast. Multidetector CT imaging of the head and neck was performed using the standard protocol during bolus administration of intravenous contrast. Multiplanar CT image reconstructions and MIPs were obtained to evaluate the vascular anatomy. Carotid stenosis measurements (when applicable) are obtained utilizing NASCET criteria, using the distal internal carotid diameter as the denominator. CONTRAST:  75mL OMNIPAQUE IOHEXOL 350 MG/ML SOLN COMPARISON:  None. FINDINGS: CT HEAD FINDINGS Brain: There is no mass, hemorrhage or extra-axial collection. The size and configuration of the ventricles and extra-axial CSF spaces are normal. The brain parenchyma is normal, without evidence of acute or chronic infarction. Vascular: No abnormal hyperdensity of the major intracranial arteries or dural venous sinuses. No intracranial atherosclerosis. Skull: The  visualized skull base, calvarium and extracranial soft tissues are normal. Sinuses/Orbits: No fluid levels or advanced mucosal thickening of the visualized paranasal sinuses. No mastoid or middle ear effusion. The orbits are normal. ASPECTS (Alberta Stroke Program Early CT Score) - Ganglionic level infarction (caudate, lentiform nuclei, internal capsule, insula, M1-M3 cortex): 7 - Supraganglionic infarction (M4-M6 cortex): 3 Total score (0-10 with 10 being normal): 10 CTA NECK FINDINGS SKELETON: There is no bony spinal canal stenosis. No lytic or blastic lesion. OTHER NECK: Normal pharynx, larynx and major salivary glands. No cervical lymphadenopathy. Unremarkable thyroid gland. UPPER CHEST: No pneumothorax or pleural effusion. No nodules or masses. AORTIC  ARCH: There is no calcific atherosclerosis of the aortic arch. There is no aneurysm, dissection or hemodynamically significant stenosis of the visualized portion of the aorta. Conventional 3 vessel aortic branching pattern. The visualized proximal subclavian arteries are widely patent. RIGHT CAROTID SYSTEM: Normal without aneurysm, dissection or stenosis. LEFT CAROTID SYSTEM: Normal without aneurysm, dissection or stenosis. VERTEBRAL ARTERIES: Left dominant configuration. Both origins are clearly patent. There is no dissection, occlusion or flow-limiting stenosis to the skull base (V1-V3 segments). CTA HEAD FINDINGS POSTERIOR CIRCULATION: --Vertebral arteries: Normal V4 segments. --Inferior cerebellar arteries: Normal. --Basilar artery: Normal. --Superior cerebellar arteries: Normal. --Posterior cerebral arteries (PCA): Mild distal left PCA narrowing. Normal right. ANTERIOR CIRCULATION: --Intracranial internal carotid arteries: Normal. --Anterior cerebral arteries (ACA): Normal. Both A1 segments are present. Patent anterior communicating artery (a-comm). --Middle cerebral arteries (MCA): Normal. VENOUS SINUSES: As permitted by contrast timing, patent. ANATOMIC  VARIANTS: Hypoplastic left ACA A1 segment, a common variant. Review of the MIP images confirms the above findings. IMPRESSION: 1. No intracranial hemorrhage.  ASPECTS is 10. 2. No emergent large vessel occlusion or hemodynamically significant stenosis by NASCET criteria. 3. Normal carotid and vertebral arteries. These results were called by telephone at the time of interpretation on 04/04/2021 at 1:14 am to provider ERIC [facility] , who verbally acknowledged these results. Electronically Signed   By: Deatra Robinson M.D.   On: 04/04/2021 01:18   CT ANGIO NECK CODE STROKE  Result Date: 04/04/2021 CLINICAL DATA:  Code stroke.  Ataxia and dysarthria. EXAM: CT HEAD WITHOUT CONTRAST CT ANGIOGRAPHY OF THE HEAD AND NECK TECHNIQUE: Contiguous axial images were obtained from the base of the skull through the vertex without intravenous contrast. Multidetector CT imaging of the head and neck was performed using the standard protocol during bolus administration of intravenous contrast. Multiplanar CT image reconstructions and MIPs were obtained to evaluate the vascular anatomy. Carotid stenosis measurements (when applicable) are obtained utilizing NASCET criteria, using the distal internal carotid diameter as the denominator. CONTRAST:  85mL OMNIPAQUE IOHEXOL 350 MG/ML SOLN COMPARISON:  None. FINDINGS: CT HEAD FINDINGS Brain: There is no mass, hemorrhage or extra-axial collection. The size and configuration of the ventricles and extra-axial CSF spaces are normal. The brain parenchyma is normal, without evidence of acute or chronic infarction. Vascular: No abnormal hyperdensity of the major intracranial arteries or dural venous sinuses. No intracranial atherosclerosis. Skull: The visualized skull base, calvarium and extracranial soft tissues are normal. Sinuses/Orbits: No fluid levels or advanced mucosal thickening of the visualized paranasal sinuses. No mastoid or middle ear effusion. The orbits are normal. ASPECTS (Alberta Stroke  Program Early CT Score) - Ganglionic level infarction (caudate, lentiform nuclei, internal capsule, insula, M1-M3 cortex): 7 - Supraganglionic infarction (M4-M6 cortex): 3 Total score (0-10 with 10 being normal): 10 CTA NECK FINDINGS SKELETON: There is no bony spinal canal stenosis. No lytic or blastic lesion. OTHER NECK: Normal pharynx, larynx and major salivary glands. No cervical lymphadenopathy. Unremarkable thyroid gland. UPPER CHEST: No pneumothorax or pleural effusion. No nodules or masses. AORTIC ARCH: There is no calcific atherosclerosis of the aortic arch. There is no aneurysm, dissection or hemodynamically significant stenosis of the visualized portion of the aorta. Conventional 3 vessel aortic branching pattern. The visualized proximal subclavian arteries are widely patent. RIGHT CAROTID SYSTEM: Normal without aneurysm, dissection or stenosis. LEFT CAROTID SYSTEM: Normal without aneurysm, dissection or stenosis. VERTEBRAL ARTERIES: Left dominant configuration. Both origins are clearly patent. There is no dissection, occlusion or flow-limiting stenosis to the skull base (V1-V3 segments). CTA  HEAD FINDINGS POSTERIOR CIRCULATION: --Vertebral arteries: Normal V4 segments. --Inferior cerebellar arteries: Normal. --Basilar artery: Normal. --Superior cerebellar arteries: Normal. --Posterior cerebral arteries (PCA): Mild distal left PCA narrowing. Normal right. ANTERIOR CIRCULATION: --Intracranial internal carotid arteries: Normal. --Anterior cerebral arteries (ACA): Normal. Both A1 segments are present. Patent anterior communicating artery (a-comm). --Middle cerebral arteries (MCA): Normal. VENOUS SINUSES: As permitted by contrast timing, patent. ANATOMIC VARIANTS: Hypoplastic left ACA A1 segment, a common variant. Review of the MIP images confirms the above findings. IMPRESSION: 1. No intracranial hemorrhage.  ASPECTS is 10. 2. No emergent large vessel occlusion or hemodynamically significant stenosis by  NASCET criteria. 3. Normal carotid and vertebral arteries. These results were called by telephone at the time of interpretation on 04/04/2021 at 1:14 am [facility] , who verbally acknowledged these results. Electronically Signed   By: Deatra RobinsonKevin  Herman M.D.   On: 04/04/2021 01:18    Procedures Procedures   Medications Ordered in ED Medications  iohexol (OMNIPAQUE) 350 MG/ML injection 75 mL (75 mLs Intravenous Contrast Given 04/04/21 0107)  aspirin tablet 650 mg (650 mg Oral Given 04/04/21 0322)    ED Course  I have reviewed the triage vital signs and the nursing notes.  Pertinent labs & imaging results that were available during my care of the patient were reviewed by me and considered in my medical decision making (see chart for details).    MDM Rules/Calculators/A&P                          75 yo M with a chief complaint of dizziness.  This started when he stood up at a dinner party.  Was noticed about 2 hours ago.  Patient with slurred speech and left upper extremity weakness as well.  Made a code stroke upon my exam.  Patient evaluated urgently by neurology.  Found to likely have a stroke based on their exam as well.  Patient declined tPA.  Will discuss with hospitalist for admission.   The patients results and plan were reviewed and discussed.   Any x-rays performed were independently reviewed by myself.   Differential diagnosis were considered with the presenting HPI.  Medications  iohexol (OMNIPAQUE) 350 MG/ML injection 75 mL (75 mLs Intravenous Contrast Given 04/04/21 0107)  aspirin tablet 650 mg (650 mg Oral Given 04/04/21 0322)    Vitals:   04/03/21 2336 04/03/21 2345 04/04/21 0000 04/04/21 0045  BP: (!) 190/80 (!) 191/76 (!) 187/81 (!) 180/79  Pulse: (!) 59 (!) 54 (!) 55 64  Resp: 17 18 15    Temp: 97.9 F (36.6 C)     TempSrc: Oral     SpO2: 99% 99% 99% 99%  Weight: 88.5 kg     Height: 5\' 6"  (1.676 m)       Final diagnoses:  Cerebellar stroke, acute (HCC)     Admission/ observation were discussed with the admitting physician, patient and/or family and they are comfortable with the plan.    Final Clinical Impression(s) / ED Diagnoses Final diagnoses:  Cerebellar stroke, acute [facility](HCC)    Rx / DC Orders ED Discharge Orders    None       Melene PlanFloyd, Sameeha Rockefeller, DO 04/04/21 [phone]

## 2021-04-04 NOTE — H&P (Signed)
History and Physical    PAULA ZIETZ ZOX:[id number] DOB: 10-08-1946 DOA: 04/03/2021  PCP: Majel Homer, MD   Patient coming from: Home  Chief Complaint: Dizziness, slurred speech that started suddenly.  HPI: Bryan Crane is a 75 y.o. male with medical history significant for HTN, cardiomyopathy, CKD 3, hx of kidney stones who presents for evaluation of sudden onset of dizziness, unsteady gait and slurred speech. He was at a family party when he stood up around 10 pm last night and was dizzy and off balance. He had to hold onto the chair and table to sit back down. He had slurred speech. EMS was called and he was transported to ER. He had no LOC. He did report a headache when symptoms began but headache is now resolved. He denies any visual change or tinnitus. He states his right hand was tingling when the symptoms started. Wife reports he has had a few episodes of dizziness over the last 2 to 3 months but it is always when he bends down and stands up quickly and it resolves within a few seconds.  He denies having any chest pain or palpitations when this event occurred tonight.  He states that a few months ago his medication was changed from Avapro to [employer].  He went to get a refill the other day of his Aceon but the pharmacy did not have it in stock and he was supposed to pick it up in a few days.  He has not been on blood pressure medication for the last few days due to being out and waiting for the refill.  Lives with his wife.  No tobacco, alcohol, illicit drug use  ED Course: In the emergency room code stroke was called and patient had a CT of the head that was negative and CT angiography of the head and neck that showed no large vessel occlusions.  Patient was seen by neurology, Dr. Otelia Limes, and MRI of the brain was obtained.  MRI shows a small acute right cerebellar CVA.  He is unremarkable.  Sodium is 141 potassium 4.0, chloride 107, bicarb 22, BUN 22, creatinine 1.80, normal LFTs.   Hospitalist service was asked to admit for further work-up and management of acute stroke  Review of Systems:  General: Reports dizziness and weakness. Denies fever, chills, weight loss, night sweats. Denies change in appetite HENT: Denies head trauma, denies change in hearing, tinnitus.  Denies nasal bleeding.  Denies sore throat, sores in mouth.  Denies difficulty swallowing Eyes: Denies blurry vision, pain in eye, drainage. Denies discoloration of eyes. Neck: Denies pain.  Denies swelling.  Denies pain with movement. Cardiovascular: Denies chest pain, palpitations. Denies edema.  Denies orthopnea Respiratory: Denies shortness of breath, cough. Denies wheezing.  Denies sputum production Gastrointestinal: Denies abdominal pain, swelling. Denies nausea, vomiting, diarrhea.  Denies melena.  Denies hematemesis. Musculoskeletal: Denies limitation of movement. Denies deformity or swelling. Denies arthralgias or myalgias. Genitourinary: Denies pelvic pain.  Denies urinary frequency or hesitancy.  Denies dysuria.  Skin: Denies rash.  Denies petechiae, purpura, ecchymosis. Neurological: Reports headache that is now resolved. Reports slurred speech. Denies syncope.  Denies seizure activity. Denies drooping face.  Denies visual change. Psychiatric: Denies depression, anxiety. Denies hallucinations.  Past Medical History:  Diagnosis Date  . Arthritis   . Cardiomyopathy (HCC)   . CKD (chronic kidney disease)   . Dyspnea    with exertion  . Enlarged prostate   . GERD (gastroesophageal reflux disease)   . History of  kidney stones   . Hypertension   . Sleep apnea    uses cpap    Past Surgical History:  Procedure Laterality Date  . CARDIAC CATHETERIZATION     "years ago" - states it was clear  . COLONOSCOPY    . KIDNEY STONE SURGERY    . ORIF ELBOW FRACTURE Left 09/30/2018   Procedure: Open reduction internal fixation left elbow fracture with repair reconstruction and  allograft;  Surgeon:  Dominica Severin, MD;  Location: [facility] OR;  Service: Orthopedics;  Laterality: Left;  90 mins  . TONSILLECTOMY      Social History  reports that he has never smoked. He has never used smokeless tobacco. He reports that he does not drink alcohol and does not use drugs.  No Known Allergies  History reviewed. No pertinent family history.   Prior to Admission medications   Medication Sig Start Date End Date Taking? Authorizing Provider  acetaminophen (TYLENOL) 500 MG tablet Take 500 mg by mouth 2 (two) times daily as needed for moderate pain or headache.    [provider]  aspirin EC 81 MG tablet Take 81 mg by mouth every evening.    [provider]  HYDROcodone-acetaminophen (NORCO/VICODIN) 5-325 MG tablet Take 1 tablet by mouth every 6 (six) hours as needed for moderate pain.    [provider]  irbesartan (AVAPRO) 300 MG tablet Take 300 mg by mouth every evening.    [provider]  lansoprazole (PREVACID) 30 MG capsule Take 30 mg by mouth daily at 12 noon.    [provider]  latanoprost (XALATAN) 0.005 % ophthalmic solution Place 1 drop into both eyes at bedtime. 08/11/18   [provider]  Multiple Vitamin (MULTIVITAMIN WITH MINERALS) TABS tablet Take 1 tablet by mouth daily.    [provider]  RANEXA 1000 MG SR tablet Take 1,000 mg by mouth daily.  03/18/15   [provider]  sodium chloride (OCEAN) 0.65 % SOLN nasal spray Place 1 spray into both nostrils as needed for congestion.    [provider]  tamsulosin (FLOMAX) 0.4 MG CAPS capsule Take 0.8 mg by mouth every evening.    [provider]    Physical Exam: Vitals:   04/03/21 2336 04/03/21 2345 04/04/21 0000 04/04/21 0045  BP: (!) 190/80 (!) 191/76 (!) 187/81 (!) 180/79  Pulse: (!) 59 (!) 54 (!) 55 64  Resp: 17 18 15    Temp: 97.9 F (36.6 C)     TempSrc: Oral     SpO2: 99% 99% 99% 99%  Weight: 88.5 kg     Height: 5\' 6"  (1.676 m)        Constitutional: NAD, calm, comfortable Vitals:   04/03/21 2336 04/03/21 2345 04/04/21 0000 04/04/21 0045  BP: (!) 190/80 (!) 191/76 (!) 187/81 (!) 180/79  Pulse: (!) 59 (!) 54 (!) 55 64  Resp: 17 18 15    Temp: 97.9 F (36.6 C)     TempSrc: Oral     SpO2: 99% 99% 99% 99%  Weight: 88.5 kg     Height: 5\' 6"  (1.676 m)      General: WDWN, Alert and oriented x3.  Eyes: EOMI, PERRL, conjunctivae normal.  Sclera nonicteric HENT:  [place]/AT, external ears normal.  Nares patent without epistasis.  Mucous membranes are moist. Posterior pharynx clear of any exudate or lesions. Neck: Soft, normal range of motion, supple, no masses, no thyromegaly.  Trachea midline Respiratory: clear to auscultation bilaterally, no wheezing, no crackles.  Normal respiratory effort. No accessory muscle use.  Cardiovascular: Regular rate and rhythm, no murmurs / rubs / gallops. No extremity edema. 2+ pedal pulses. No carotid bruits.  Abdomen: Soft, no tenderness, nondistended, no rebound or guarding. No masses palpated. Bowel sounds normoactive Musculoskeletal: FROM. no cyanosis. No joint deformity upper and lower extremities. Normal muscle tone.  Skin: Warm, dry, intact no rashes, lesions, ulcers. No induration Neurologic: CN 2-12 grossly intact. Mildly slurred speech. Ataxia on heel to shin on right. Babinski downgoing bilaterally. No tremor. Sensation intact to light touch, patella DTR +1 bilaterally. Strength 5/5 in all extremities. Psychiatric: Normal judgment and insight.  Normal mood.   NIHSS--3  Labs on Admission: I have personally reviewed following labs and imaging studies  CBC: Recent Labs  Lab 04/04/21 0030 04/04/21 0053  WBC 9.5  --   NEUTROABS 6.4  --   HGB 16.7 16.7  HCT 48.0 49.0  MCV 90.9  --   PLT 304  --     Basic Metabolic Panel: Recent Labs  Lab 04/04/21 0030 04/04/21 0053  NA 137 141  K 4.5 4.0  CL 106 107  CO2 22  --   GLUCOSE 107* 107*  BUN 20 22  CREATININE 1.83* 1.80*   CALCIUM 9.4  --     GFR: Estimated Creatinine Clearance: 37 mL/min (A) (by C-G formula based on SCr of 1.8 mg/dL (H)).  Liver Function Tests: Recent Labs  Lab 04/04/21 0030  AST 24  ALT 15  ALKPHOS 41  BILITOT 0.8  PROT 7.1  ALBUMIN 4.0    Urine analysis:    Component Value Date/Time   COLORURINE STRAW (A) 04/04/2021 0150   APPEARANCEUR CLEAR 04/04/2021 0150   LABSPEC 1.020 04/04/2021 0150   PHURINE 5.0 04/04/2021 0150   GLUCOSEU NEGATIVE 04/04/2021 0150   HGBUR NEGATIVE 04/04/2021 0150   BILIRUBINUR NEGATIVE 04/04/2021 0150   KETONESUR NEGATIVE 04/04/2021 0150   PROTEINUR NEGATIVE 04/04/2021 0150   UROBILINOGEN 0.2 06/10/2015 1353   NITRITE NEGATIVE 04/04/2021 0150   LEUKOCYTESUR NEGATIVE 04/04/2021 0150    Radiological Exams on Admission: MR BRAIN WO CONTRAST  Result Date: 04/04/2021 CLINICAL DATA:  Stroke follow-up EXAM: MRI HEAD WITHOUT CONTRAST TECHNIQUE: Multiplanar, multiecho pulse sequences of the brain and surrounding structures were obtained without intravenous contrast. COMPARISON:  CTA head neck 04/04/2021 FINDINGS: Brain: There is a small acute infarct of the anterior superior right cerebellum. No acute or chronic hemorrhage. Normal white matter signal, parenchymal volume and CSF spaces. The midline structures are normal. Vascular: Major flow voids are preserved. Skull and upper cervical spine: Normal calvarium and skull base. Visualized upper cervical spine and soft tissues are normal. Sinuses/Orbits:No paranasal sinus fluid levels or advanced mucosal thickening. No mastoid or middle ear effusion. Normal orbits. 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 No hemorrhage or mass effect. Electronically Signed   By: Deatra Robinson M.D.   On: 04/04/2021 02:32   CT HEAD CODE STROKE WO CONTRAST  Result Date: 04/04/2021 CLINICAL DATA:  Code stroke.  Ataxia and dysarthria. EXAM: CT HEAD WITHOUT CONTRAST CT ANGIOGRAPHY OF THE HEAD AND NECK  TECHNIQUE: Contiguous axial images were obtained from the base of the skull through the vertex without intravenous contrast. Multidetector CT imaging of the head and neck was performed using the standard protocol during bolus administration of intravenous contrast. Multiplanar CT image reconstructions and MIPs were obtained to evaluate the vascular anatomy. Carotid stenosis measurements (when applicable) are obtained utilizing NASCET criteria, using the  distal internal carotid diameter as the denominator. CONTRAST:  75mL OMNIPAQUE IOHEXOL 350 MG/ML SOLN COMPARISON:  None. FINDINGS: CT HEAD FINDINGS Brain: There is no mass, hemorrhage or extra-axial collection. The size and configuration of the ventricles and extra-axial CSF spaces are normal. The brain parenchyma is normal, without evidence of acute or chronic infarction. Vascular: No abnormal hyperdensity of the major intracranial arteries or dural venous sinuses. No intracranial atherosclerosis. Skull: The visualized skull base, calvarium and extracranial soft tissues are normal. Sinuses/Orbits: No fluid levels or advanced mucosal thickening of the visualized paranasal sinuses. No mastoid or middle ear effusion. The orbits are normal. ASPECTS (Alberta Stroke Program Early CT Score) - Ganglionic level infarction (caudate, lentiform nuclei, internal capsule, insula, M1-M3 cortex): 7 - Supraganglionic infarction (M4-M6 cortex): 3 Total score (0-10 with 10 being normal): 10 CTA NECK FINDINGS SKELETON: There is no bony spinal canal stenosis. No lytic or blastic lesion. OTHER NECK: Normal pharynx, larynx and major salivary glands. No cervical lymphadenopathy. Unremarkable thyroid gland. UPPER CHEST: No pneumothorax or pleural effusion. No nodules or masses. AORTIC ARCH: There is no calcific atherosclerosis of the aortic arch. There is no aneurysm, dissection or hemodynamically significant stenosis of the visualized portion of the aorta. Conventional 3 vessel aortic  branching pattern. The visualized proximal subclavian arteries are widely patent. RIGHT CAROTID SYSTEM: Normal without aneurysm, dissection or stenosis. LEFT CAROTID SYSTEM: Normal without aneurysm, dissection or stenosis. VERTEBRAL ARTERIES: Left dominant configuration. Both origins are clearly patent. There is no dissection, occlusion or flow-limiting stenosis to the skull base (V1-V3 segments). CTA HEAD FINDINGS POSTERIOR CIRCULATION: --Vertebral arteries: Normal V4 segments. --Inferior cerebellar arteries: Normal. --Basilar artery: Normal. --Superior cerebellar arteries: Normal. --Posterior cerebral arteries (PCA): Mild distal left PCA narrowing. Normal right. ANTERIOR CIRCULATION: --Intracranial internal carotid arteries: Normal. --Anterior cerebral arteries (ACA): Normal. Both A1 segments are present. Patent anterior communicating artery (a-comm). --Middle cerebral arteries (MCA): Normal. VENOUS SINUSES: As permitted by contrast timing, patent. ANATOMIC VARIANTS: Hypoplastic left ACA A1 segment, a common variant. Review of the MIP images confirms the above findings. IMPRESSION: 1. No intracranial hemorrhage.  ASPECTS is 10. 2. No emergent large vessel occlusion or hemodynamically significant stenosis by NASCET criteria. 3. Normal carotid and vertebral arteries. These results were called by telephone at the time of interpretation on 04/04/2021 at 1:14 am to provider ERIC [facility] , who verbally acknowledged these results. Electronically Signed   By: Deatra Robinson M.D.   On: 04/04/2021 01:18   CT ANGIO HEAD CODE STROKE  Result Date: 04/04/2021 CLINICAL DATA:  Code stroke.  Ataxia and dysarthria. EXAM: CT HEAD WITHOUT CONTRAST CT ANGIOGRAPHY OF THE HEAD AND NECK TECHNIQUE: Contiguous axial images were obtained from the base of the skull through the vertex without intravenous contrast. Multidetector CT imaging of the head and neck was performed using the standard protocol during bolus administration of intravenous  contrast. Multiplanar CT image reconstructions and MIPs were obtained to evaluate the vascular anatomy. Carotid stenosis measurements (when applicable) are obtained utilizing NASCET criteria, using the distal internal carotid diameter as the denominator. CONTRAST:  75mL OMNIPAQUE IOHEXOL 350 MG/ML SOLN COMPARISON:  None. FINDINGS: CT HEAD FINDINGS Brain: There is no mass, hemorrhage or extra-axial collection. The size and configuration of the ventricles and extra-axial CSF spaces are normal. The brain parenchyma is normal, without evidence of acute or chronic infarction. Vascular: No abnormal hyperdensity of the major intracranial arteries or dural venous sinuses. No intracranial atherosclerosis. Skull: The visualized skull base, calvarium and extracranial soft tissues are  normal. Sinuses/Orbits: No fluid levels or advanced mucosal thickening of the visualized paranasal sinuses. No mastoid or middle ear effusion. The orbits are normal. ASPECTS (Alberta Stroke Program Early CT Score) - Ganglionic level infarction (caudate, lentiform nuclei, internal capsule, insula, M1-M3 cortex): 7 - Supraganglionic infarction (M4-M6 cortex): 3 Total score (0-10 with 10 being normal): 10 CTA NECK FINDINGS SKELETON: There is no bony spinal canal stenosis. No lytic or blastic lesion. OTHER NECK: Normal pharynx, larynx and major salivary glands. No cervical lymphadenopathy. Unremarkable thyroid gland. UPPER CHEST: No pneumothorax or pleural effusion. No nodules or masses. AORTIC ARCH: There is no calcific atherosclerosis of the aortic arch. There is no aneurysm, dissection or hemodynamically significant stenosis of the visualized portion of the aorta. Conventional 3 vessel aortic branching pattern. The visualized proximal subclavian arteries are widely patent. RIGHT CAROTID SYSTEM: Normal without aneurysm, dissection or stenosis. LEFT CAROTID SYSTEM: Normal without aneurysm, dissection or stenosis. VERTEBRAL ARTERIES: Left dominant  configuration. Both origins are clearly patent. There is no dissection, occlusion or flow-limiting stenosis to the skull base (V1-V3 segments). CTA HEAD FINDINGS POSTERIOR CIRCULATION: --Vertebral arteries: Normal V4 segments. --Inferior cerebellar arteries: Normal. --Basilar artery: Normal. --Superior cerebellar arteries: Normal. --Posterior cerebral arteries (PCA): Mild distal left PCA narrowing. Normal right. ANTERIOR CIRCULATION: --Intracranial internal carotid arteries: Normal. --Anterior cerebral arteries (ACA): Normal. Both A1 segments are present. Patent anterior communicating artery (a-comm). --Middle cerebral arteries (MCA): Normal. VENOUS SINUSES: As permitted by contrast timing, patent. ANATOMIC VARIANTS: Hypoplastic left ACA A1 segment, a common variant. Review of the MIP images confirms the above findings. IMPRESSION: 1. No intracranial hemorrhage.  ASPECTS is 10. 2. No emergent large vessel occlusion or hemodynamically significant stenosis by NASCET criteria. 3. Normal carotid and vertebral arteries. These results were called by telephone at the time of interpretation on 04/04/2021 at 1:14 am to provider ERIC [facility] , who verbally acknowledged these results. Electronically Signed   By: Deatra Robinson M.D.   On: 04/04/2021 01:18   CT ANGIO NECK CODE STROKE  Result Date: 04/04/2021 CLINICAL DATA:  Code stroke.  Ataxia and dysarthria. EXAM: CT HEAD WITHOUT CONTRAST CT ANGIOGRAPHY OF THE HEAD AND NECK TECHNIQUE: Contiguous axial images were obtained from the base of the skull through the vertex without intravenous contrast. Multidetector CT imaging of the head and neck was performed using the standard protocol during bolus administration of intravenous contrast. Multiplanar CT image reconstructions and MIPs were obtained to evaluate the vascular anatomy. Carotid stenosis measurements (when applicable) are obtained utilizing NASCET criteria, using the distal internal carotid diameter as the denominator.  CONTRAST:  75mL OMNIPAQUE IOHEXOL 350 MG/ML SOLN COMPARISON:  None. FINDINGS: CT HEAD FINDINGS Brain: There is no mass, hemorrhage or extra-axial collection. The size and configuration of the ventricles and extra-axial CSF spaces are normal. The brain parenchyma is normal, without evidence of acute or chronic infarction. Vascular: No abnormal hyperdensity of the major intracranial arteries or dural venous sinuses. No intracranial atherosclerosis. Skull: The visualized skull base, calvarium and extracranial soft tissues are normal. Sinuses/Orbits: No fluid levels or advanced mucosal thickening of the visualized paranasal sinuses. No mastoid or middle ear effusion. The orbits are normal. ASPECTS (Alberta Stroke Program Early CT Score) - Ganglionic level infarction (caudate, lentiform nuclei, internal capsule, insula, M1-M3 cortex): 7 - Supraganglionic infarction (M4-M6 cortex): 3 Total score (0-10 with 10 being normal): 10 CTA NECK FINDINGS SKELETON: There is no bony spinal canal stenosis. No lytic or blastic lesion. OTHER NECK: Normal pharynx, larynx and major salivary glands.  No cervical lymphadenopathy. Unremarkable thyroid gland. UPPER CHEST: No pneumothorax or pleural effusion. No nodules or masses. AORTIC ARCH: There is no calcific atherosclerosis of the aortic arch. There is no aneurysm, dissection or hemodynamically significant stenosis of the visualized portion of the aorta. Conventional 3 vessel aortic branching pattern. The visualized proximal subclavian arteries are widely patent. RIGHT CAROTID SYSTEM: Normal without aneurysm, dissection or stenosis. LEFT CAROTID SYSTEM: Normal without aneurysm, dissection or stenosis. VERTEBRAL ARTERIES: Left dominant configuration. Both origins are clearly patent. There is no dissection, occlusion or flow-limiting stenosis to the skull base (V1-V3 segments). CTA HEAD FINDINGS POSTERIOR CIRCULATION: --Vertebral arteries: Normal V4 segments. --Inferior cerebellar arteries:  Normal. --Basilar artery: Normal. --Superior cerebellar arteries: Normal. --Posterior cerebral arteries (PCA): Mild distal left PCA narrowing. Normal right. ANTERIOR CIRCULATION: --Intracranial internal carotid arteries: Normal. --Anterior cerebral arteries (ACA): Normal. Both A1 segments are present. Patent anterior communicating artery (a-comm). --Middle cerebral arteries (MCA): Normal. VENOUS SINUSES: As permitted by contrast timing, patent. ANATOMIC VARIANTS: Hypoplastic left ACA A1 segment, a common variant. Review of the MIP images confirms the above findings. IMPRESSION: 1. No intracranial hemorrhage.  ASPECTS is 10. 2. No emergent large vessel occlusion or hemodynamically significant stenosis by NASCET criteria. 3. Normal carotid and vertebral arteries. These results were called by telephone at the time of interpretation on 04/04/2021 at 1:14 am to provider ERIC [facility] , who verbally acknowledged these results. Electronically Signed   By: Deatra Robinson M.D.   On: 04/04/2021 01:18    EKG: Independently reviewed.  EKG shows normal sinus rhythm with right bundle branch block and atrial enlargement.  No acute ST elevation or depression.  QTc 455  Assessment/Plan Principal Problem:   Acute cerebrovascular accident (CVA) of cerebellum  Bryan Crane is admitted to medical telemetry floor for cerebellar CVA. Obtain echocardiogram to evaluate for PFO, wall motion and ejection fraction. Hypertension of 220/110 will be allowed for 24 hours per stroke protocol.  After which blood pressure will be slowly reduced to goal level. Antiplatelet therapy with aspirin daily. Start statin therapy with lipitor 80 mg a day.  Check lipid panel.  Neurochecks per stroke protocol Consult PT/OT/ST in am IVF with LR overnight.   Active Problems:   Dysarthria Secondary to CVA.  Swallow screening to be done by RN upon arrival to floor. Consult ST in am    Essential hypertension Pt reports he was changed from Avapro to  Aceon a month or two ago but pharmacy did not have Aceon in stock so has not taken in past few days.     CKD (chronic kidney disease) stage 3, GFR 30-59 ml/min  Chronic. Check electrolytes and renal function in am     DVT prophylaxis:  SCD's for DVT prophylaxis. No anticoagulation with acute CVA on MRI Code Status:   Full Code  Family Communication:  Diagnosis and plan discussed with patient and his family who is at the bedside. Questions answered.  They verbalized understanding agree with plan.  Further recommendations to follow as clinically indicated Disposition Plan:   Patient is from:  Home  Anticipated DC to:  Home  Anticipated DC date:  Anticipate 2 midnight stay in the hospital  Anticipated DC barriers: No barriers identified at this time  Consults called:  Neurology, Dr. Otelia Limes Admission status:  Inpatient   Claudean Severance Patriece Archbold MD [facility]  How to contact the [facility] Attending or Consulting provider 7A - 7P or covering provider during after hours 7P -7A, for this patient?  1. Check the care team in [facility] and look for a) attending/consulting [facility] provider listed and b) the [facility] team listed 2. Log into [email or web address] and use [place]'s universal password to access. If you do not have the password, please contact the hospital operator. 3. Locate the [facility] provider you are looking for under [facility] and page to a number that you can be directly reached. 4. If you still have difficulty reaching the provider, please page the [facility] (Director on Call) for the Hospitalists listed on amion for assistance.  04/04/2021, 2:57 AM

## 2021-04-04 NOTE — Evaluation (Signed)
Physical Therapy Evaluation Patient Details Name: Bryan Crane MRN: [id number] DOB: Jan 02, 1946 Today's Date: 04/04/2021   History of Present Illness  The pt is a 75 yo male presenting 6/3 due to acute onset of dizziness and balance issues. 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 PMH includes: HTN, cardiomyopathy, CKD III, and kidney stones.    Clinical Impression  Pt in bed upon arrival of PT, agreeable to evaluation at this time. Prior to admission the pt was completely independent with all mobility and ADLs, living with his wife in a home with 3 STE. The pt now presents with limitations in functional mobility, strength, stability, and coordination due to above dx, and will continue to benefit from skilled PT to address these deficits. The pt was eager to mobilize, but needed increased time, cues, and minA to complete all bed mobility at this time. He was able to stand with single UE support, but had multiple LOB requiring modA to steady, so was given RW for improved stability. The pt was able to complete short bout of mobility with use of RW, but continued to require intermittent minA-modA to correct LOB and staggering steps. The pt is at significantly increased risk of falls and demos poor safety awareness, awareness of deficits, and ability to problem solve to avoid or manage potential balance issues. Recommend CIR level therapies to maximize functional recovery and reduce risk of falls when eventually d/c home.      Follow Up Recommendations CIR    Equipment Recommendations  Rolling walker with 5" wheels;3in1 (PT) (defer to post acute)    Recommendations for Other Services Rehab consult     Precautions / Restrictions Precautions Precautions: Fall Restrictions Weight Bearing Restrictions: No      Mobility  Bed Mobility Overal bed mobility: Needs Assistance Bed Mobility: Supine to Sit;Sit to Supine     Supine to sit: Min assist Sit to supine: Min assist   General bed  mobility comments: minA to transition to sitting EOB with min-modA to steady once in sitting position. pt with posterior lean, needing cues to maintain trunk forward and feet on floor    Transfers Overall transfer level: Needs assistance Equipment used: 1 person hand held assist;Rolling walker (2 wheeled) Transfers: Sit to/from Stand Sit to Stand: Mod assist;Min assist         General transfer comment: modA initially to power up with significant posterior lean and lateral sway. Pt able to adjust with increased time and cues. progressed to use of RW for BUE support, pt able to push to stand and steady with RW with minA  Ambulation/Gait Ambulation/Gait assistance: Min assist;Mod assist Gait Distance (Feet): 45 Feet Assistive device: Rolling walker (2 wheeled) Gait Pattern/deviations: Step-through pattern;Decreased stride length;Decreased weight shift to right;Decreased stance time - right;Ataxic;Staggering left Gait velocity: decreased Gait velocity interpretation: <1.31 ft/sec, indicative of household ambulator General Gait Details: pt with multiple LOB to L requiring modA to steady and recover. difficulty maintaining RW on ground at times without assist. inconsistent step lengths but generally short with decerased wt shift to RLE     Modified Rankin (Stroke Patients Only) Modified Rankin (Stroke Patients Only) Pre-Morbid Rankin Score: No symptoms Modified Rankin: Moderately severe disability     Balance Overall balance assessment: Needs assistance Sitting-balance support: Single extremity supported;Feet supported Sitting balance-Leahy Scale: Poor Sitting balance - Comments: reliant on UE support and support from therapist Postural control: Posterior lean;Right lateral lean;Left lateral lean Standing balance support: Bilateral upper extremity supported Standing balance-Leahy Scale:  Poor Standing balance comment: reliant on BUE support and minA     Tandem Stance - Right Leg:  0 Tandem Stance - Left Leg: 2 Rhomberg - Eyes Opened: 15 (mild lateral and A-P sway) Rhomberg - Eyes Closed: 8 (significant lateral and A-P sway)                 Pertinent Vitals/Pain Pain Assessment: No/denies pain    Home Living Family/patient expects to be discharged to:: Private residence Living Arrangements: Spouse/significant other Available Help at Discharge: Family;Available 24 hours/day Type of Home: House Home Access: Stairs to enter Entrance Stairs-Rails: Right Entrance Stairs-Number of Steps: 3 Home Layout: One level Home Equipment: Cane - single point      Prior Function Level of Independence: Independent         Comments: independent with all activity, walking ~1 hour at a time and biking at house for activity     Hand Dominance   Dominant Hand: Left    Extremity/Trunk Assessment   Upper Extremity Assessment Upper Extremity Assessment: Defer to OT evaluation;RUE deficits/detail RUE Deficits / Details: grossly functional strength, noted coordination deficits and dysmetria RUE Coordination: decreased fine motor;decreased gross motor    Lower Extremity Assessment Lower Extremity Assessment: Overall WFL for tasks assessed    Cervical / Trunk Assessment Cervical / Trunk Assessment: Normal  Communication   Communication: No difficulties  Cognition Arousal/Alertness: Awake/alert Behavior During Therapy: WFL for tasks assessed/performed Overall Cognitive Status: Impaired/Different from baseline Area of Impairment: Safety/judgement;Problem solving;Awareness                         Safety/Judgement: Decreased awareness of safety;Decreased awareness of deficits Awareness: Intellectual Problem Solving: Decreased initiation;Difficulty sequencing;Requires verbal cues General Comments: Pt agreeable and with good recall, able to adopt all cues, but with decreased insight to deficits, safety, and needing cues for problem solving      General  Comments General comments (skin integrity, edema, etc.): VSS with all changes in position        Assessment/Plan    PT Assessment Patient needs continued PT services  PT Problem List Decreased strength;Decreased activity tolerance;Decreased balance;Decreased mobility;Decreased coordination;Decreased safety awareness           PT Goals (Current goals can be found in the Care Plan section)  Acute Rehab PT Goals Patient Stated Goal: return home as independent as possible PT Goal Formulation: With patient Time For Goal Achievement: 04/18/21 Potential to Achieve Goals: Good    Frequency Min 4X/week    AM-PAC PT "6 Clicks" Mobility  Outcome Measure Help needed turning from your back to your side while in a flat bed without using bedrails?: None Help needed moving from lying on your back to sitting on the side of a flat bed without using bedrails?: A Little Help needed moving to and from a bed to a chair (including a wheelchair)?: A Lot Help needed standing up from a chair using your arms (e.g., wheelchair or bedside chair)?: A Lot Help needed to walk in hospital room?: A Lot Help needed climbing 3-5 steps with a railing? : Total 6 Click Score: 14    End of Session Equipment Utilized During Treatment: Gait belt Activity Tolerance: Patient tolerated treatment well Patient left: in bed;with call bell/phone within reach;with bed alarm set;with family/visitor present Nurse Communication: Mobility status PT Visit Diagnosis: Other abnormalities of gait and mobility (R26.89);Unsteadiness on feet (R26.81)    Time: 3267-1245 PT Time Calculation (min) (ACUTE ONLY):  38 min   Charges:   PT Evaluation $PT Eval Moderate Complexity: 1 Mod PT Treatments $Gait Training: 8-22 mins $Neuromuscular Re-education: 8-22 mins        Rolm Baptise, PT, DPT   Acute Rehabilitation Department Pager #: [phone]  Gaetana Michaelis 04/04/2021, 10:50 AM

## 2021-04-04 NOTE — Progress Notes (Signed)
PROGRESS NOTE  Bryan Crane TJQ:[id number] DOB: Apr 03, 1946   PCP: Majel Homer, MD  Patient is from: Home.  DOA: 04/03/2021 LOS: 0  Chief complaints:  Chief Complaint  Patient presents with  . Dizziness    Brief Narrative / Interim history: 75 year old M with PMH of cardiomyopathy, OSA on CPAP, CKD-3A, obesity, HTN and GERD presenting with sudden onset of an STD gait, slurred speech, dizziness and headache and found to have small right cerebellar CVA on MRI.  Patient has not taken his blood pressure medication for few days.  PCP changed his Avapro to Aceon recently, and pharmacy did not have it to stop.  CT head and CTA head and neck without significant finding.  Neurology following.  Subjective: Seen and examined earlier this morning.  Reports improvement in his symptoms but he still feels "woozy".  Has mild headache but no acute changes to his vision.  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  He denies new numbness, tingling or weakness.  He denies chest pain or dyspnea.   Objective: Vitals:   04/04/21 0615 04/04/21 0649 04/04/21 0845 04/04/21 1234  BP: (!) 148/74 (!) 155/78 (!) 167/78 (!) 159/76  Pulse: (!) 55 (!) 54 (!) 57 (!) 58  Resp: 17 (!) 22 18 18   Temp:  97.8 F (36.6 C) 97.6 F (36.4 C) 98.2 F (36.8 C)  TempSrc:  Oral Oral Oral  SpO2: 96% 100% 99% 97%  Weight:      Height:        Intake/Output Summary (Last 24 hours) at 04/04/2021 1326 Last data filed at 04/04/2021 0800 Gross per 24 hour  Intake --  Output 150 ml  Net -150 ml   Filed Weights   04/03/21 2336  Weight: 88.5 kg    Examination:  GENERAL: No apparent distress.  Nontoxic. HEENT: MMM.  Vision and hearing grossly intact.  NECK: Supple.  No apparent JVD.  RESP: On RA.  No IWOB.  Fair aeration bilaterally. CVS:  RRR. Heart sounds normal.  ABD/GI/GU: BS+. Abd soft, NTND.  MSK/EXT:  Moves extremities. No apparent deformity. No edema.  SKIN: no apparent skin lesion  or wound NEURO: Awake, alert and oriented appropriately.  Slight dysarthria.  Cranial nerves II-XII intact. Motor 5/5 in all muscle groups of UE and LE bilaterally, Normal tone. Light sensation intact in all dermatomes of upper and lower ext bilaterally. Patellar reflex symmetric.  No pronator drift.  No nystagmus.  Finger to nose slightly off on the right. PSYCH: Calm. Normal affect.   Procedures:  None  Microbiology summarized: COVID-19 and influenza PCR nonreactive.  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  Presented with unsteady gait, dysarthria, dizziness and headache.  CT head/CTA head and neck without significant finding.  LDL 114.  UDS negative.  Received 650 mg of aspirin in ED. -Neurology following -Modified permissive hypertension for 24 hours -Follow TTE and A1c -Currently on aspirin and Plavix-follow neuro recs on this. -Start high intensity statin -Continue telemetry monitoring -PT/OT/SLP eval  Uncontrolled hypertension History of cardiomyopathy-unclear etiology.  No prior echo in the chart -Modified permissive hypertension -Follow echocardiogram  OSA on CPAP -Continue nightly CPAP  CKD-3A: Seems to be at baseline. Recent Labs    04/04/21 0030 04/04/21 0053  BUN 20 22  CREATININE 1.83* 1.80*   Hyperlipidemia: LDL 114. -Started high intensity statin.  GERD -Protonix  Class I obesity Body mass index is 31.47 kg/m.  -Encourage lifestyle change to  lose weight.       DVT prophylaxis:  enoxaparin (LOVENOX) injection 40 mg Start: 04/04/21 1415 SCD's Start: 04/04/21 0649  Code Status: Full code Family Communication: Updated patient's wife at bedside. Level of care: Telemetry Medical Status is: Inpatient  Remains inpatient appropriate because:Ongoing diagnostic testing needed not appropriate for outpatient work up and Inpatient level of care appropriate due to severity of illness   Dispo: The patient is from: Home               Anticipated d/c is to: Home              Patient currently is not medically stable to d/c.   Difficult to place patient No       Consultants:  Neurology   Sch Meds:  Scheduled Meds: . aspirin EC  81 mg Oral QPM  . atorvastatin  80 mg Oral Daily  . clopidogrel  75 mg Oral Daily  . enoxaparin (LOVENOX) injection  40 mg Subcutaneous Q24H  . latanoprost  1 drop Both Eyes QHS  . pantoprazole  40 mg Oral Daily  . ranolazine  1,000 mg Oral Daily  . tamsulosin  0.8 mg Oral QPM   Continuous Infusions: . lactated ringers 100 mL/hr at 04/04/21 1230   PRN Meds:.acetaminophen **OR** acetaminophen (TYLENOL) oral liquid 160 mg/5 mL **OR** acetaminophen, senna-docusate  Antimicrobials: Anti-infectives (From admission, onward)   None       I have personally reviewed the following labs and images: CBC: Recent Labs  Lab 04/04/21 0030 04/04/21 0053  WBC 9.5  --   NEUTROABS 6.4  --   HGB 16.7 16.7  HCT 48.0 49.0  MCV 90.9  --   PLT 304  --    BMP &GFR Recent Labs  Lab 04/04/21 0030 04/04/21 0053  NA 137 141  K 4.5 4.0  CL 106 107  CO2 22  --   GLUCOSE 107* 107*  BUN 20 22  CREATININE 1.83* 1.80*  CALCIUM 9.4  --    Estimated Creatinine Clearance: 37 mL/min (A) (by C-G formula based on SCr of 1.8 mg/dL (H)). Liver & Pancreas: Recent Labs  Lab 04/04/21 0030  AST 24  ALT 15  ALKPHOS 41  BILITOT 0.8  PROT 7.1  ALBUMIN 4.0   No results for input(s): LIPASE, AMYLASE in the last 168 hours. No results for input(s): AMMONIA in the last 168 hours. Diabetic: No results for input(s): HGBA1C in the last 72 hours. Recent Labs  Lab 04/04/21 0036  GLUCAP 123*   Cardiac Enzymes: No results for input(s): CKTOTAL, CKMB, CKMBINDEX, TROPONINI in the last 168 hours. No results for input(s): PROBNP in the last 8760 hours. Coagulation Profile: Recent Labs  Lab 04/04/21 0030  INR 1.1   Thyroid Function Tests: No results for input(s): TSH, T4TOTAL, FREET4, T3FREE,  THYROIDAB in the last 72 hours. Lipid Profile: Recent Labs    04/04/21 0649  CHOL 187  HDL 40*  LDLCALC 114*  TRIG 167*  CHOLHDL 4.7   Anemia Panel: No results for input(s): VITAMINB12, FOLATE, FERRITIN, TIBC, IRON, RETICCTPCT in the last 72 hours. Urine analysis:    Component Value Date/Time   COLORURINE STRAW (A) 04/04/2021 0150   APPEARANCEUR CLEAR 04/04/2021 0150   LABSPEC 1.020 04/04/2021 0150   PHURINE 5.0 04/04/2021 0150   GLUCOSEU NEGATIVE 04/04/2021 0150   HGBUR NEGATIVE 04/04/2021 0150   BILIRUBINUR NEGATIVE 04/04/2021 0150   KETONESUR NEGATIVE 04/04/2021 0150   PROTEINUR NEGATIVE 04/04/2021 0150  UROBILINOGEN 0.2 06/10/2015 1353   NITRITE NEGATIVE 04/04/2021 0150   LEUKOCYTESUR NEGATIVE 04/04/2021 0150   Sepsis Labs: Invalid input(s): PROCALCITONIN, LACTICIDVEN  Microbiology: Recent Results (from the past 240 hour(s))  Resp Panel by RT-PCR (Flu A&B, Covid) Nasopharyngeal Swab     Status: None   Collection Time: 04/04/21 12:22 AM   Specimen: Nasopharyngeal Swab; Nasopharyngeal(NP) swabs in vial transport medium  Result Value Ref Range Status   SARS Coronavirus 2 by RT PCR NEGATIVE NEGATIVE Final    Comment: (NOTE) SARS-CoV-2 target nucleic acids are NOT DETECTED.  The SARS-CoV-2 RNA is generally detectable in upper respiratory specimens during the acute phase of infection. The lowest concentration of SARS-CoV-2 viral copies this assay can detect is 138 copies/mL. A negative result does not preclude SARS-Cov-2 infection and should not be used as the sole basis for treatment or other patient management decisions. A negative result may occur with  improper specimen collection/handling, submission of specimen other than nasopharyngeal swab, presence of viral mutation(s) within the areas targeted by this assay, and inadequate number of viral copies(<138 copies/mL). A negative result must be combined with clinical observations, patient history, and  epidemiological information. The expected result is Negative.  Fact Sheet for Patients:  BloggerCourse.com  Fact Sheet for Healthcare Providers:  SeriousBroker.it  This test is no t yet approved or cleared by the [place] FDA and  has been authorized for detection and/or diagnosis of SARS-CoV-2 by FDA under an Emergency Use Authorization (EUA). This EUA will remain  in effect (meaning this test can be used) for the duration of the COVID-19 declaration under Section 564(b)(1) of the Act, 21 U.S.C.section 360bbb-3(b)(1), unless the authorization is terminated  or revoked sooner.       Influenza A by PCR NEGATIVE NEGATIVE Final   Influenza B by PCR NEGATIVE NEGATIVE Final    Comment: (NOTE) The Xpert Xpress SARS-CoV-2/FLU/RSV plus assay is intended as an aid in the diagnosis of influenza from Nasopharyngeal swab specimens and should not be used as a sole basis for treatment. Nasal washings and aspirates are unacceptable for Xpert Xpress SARS-CoV-2/FLU/RSV testing.  Fact Sheet for Patients: BloggerCourse.com  Fact Sheet for Healthcare Providers: SeriousBroker.it  This test is not yet approved or cleared by the [place] FDA and has been authorized for detection and/or diagnosis of SARS-CoV-2 by FDA under an Emergency Use Authorization (EUA). This EUA will remain in effect (meaning this test can be used) for the duration of the COVID-19 declaration under Section 564(b)(1) of the Act, 21 U.S.C. section 360bbb-3(b)(1), unless the authorization is terminated or revoked.  Performed at [facility], [address], [place], [place] [address]     Radiology Studies: MR BRAIN WO CONTRAST  Result Date: 04/04/2021 CLINICAL DATA:  Stroke follow-up EXAM: MRI HEAD WITHOUT CONTRAST TECHNIQUE: Multiplanar, multiecho pulse sequences of the brain and surrounding structures were  obtained without intravenous contrast. COMPARISON:  CTA head neck 04/04/2021 FINDINGS: Brain: There is a small acute infarct of the anterior superior right cerebellum. No acute or chronic hemorrhage. Normal white matter signal, parenchymal volume and CSF spaces. The midline structures are normal. Vascular: Major flow voids are preserved. Skull and upper cervical spine: Normal calvarium and skull base. Visualized upper cervical spine and soft tissues are normal. Sinuses/Orbits:No paranasal sinus fluid levels or advanced mucosal thickening. No mastoid or middle ear effusion. Normal orbits. 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 No hemorrhage or mass effect. Electronically Signed   By: Caryn Bee  Chase Picket M.D.   On: 04/04/2021 02:32   ECHOCARDIOGRAM COMPLETE  Result Date: 04/04/2021    ECHOCARDIOGRAM REPORT   Patient Name:   YAEL ANGERER Date of Exam: 04/04/2021 Medical Rec #:  [id number]         Height:       66.0 in Accession #:    [id number]        Weight:       195.0 lb Date of Birth:  Apr 01, 1946         BSA:          1.978 m Patient Age:    74 years          BP:           167/78 mmHg Patient Gender: M                 HR:           55 bpm. Exam Location:  Inpatient Procedure: 2D Echo Indications:    stroke  History:        Patient has no prior history of Echocardiogram examinations.                 Cardiomyopathy, chronic kidney disease, Arrythmias:PVC; Risk                 Factors:Hypertension.  Sonographer:    Delcie Roch Referring Phys: [id number] BRADLEY S CHOTINER IMPRESSIONS  1. Left ventricular ejection fraction, by estimation, is 60 to 65%. The left ventricle has normal function. The left ventricle has no regional wall motion abnormalities. Left ventricular diastolic parameters were normal.  2. Right ventricular systolic function is normal. The right ventricular size is normal.  3. The mitral valve is normal in structure. No evidence of mitral valve regurgitation. No  evidence of mitral stenosis.  4. The aortic valve is normal in structure. Aortic valve regurgitation is not visualized. No aortic stenosis is present.  5. The inferior vena cava is normal in size with greater than 50% respiratory variability, suggesting right atrial pressure of 3 mmHg. FINDINGS  Left Ventricle: Left ventricular ejection fraction, by estimation, is 60 to 65%. The left ventricle has normal function. The left ventricle has no regional wall motion abnormalities. The left ventricular internal cavity size was normal in size. There is  no left ventricular hypertrophy. Left ventricular diastolic parameters were normal. Normal left ventricular filling pressure. Right Ventricle: The right ventricular size is normal. No increase in right ventricular wall thickness. Right ventricular systolic function is normal. Left Atrium: Left atrial size was normal in size. Right Atrium: Right atrial size was normal in size. Pericardium: There is no evidence of pericardial effusion. Mitral Valve: The mitral valve is normal in structure. No evidence of mitral valve regurgitation. No evidence of mitral valve stenosis. Tricuspid Valve: The tricuspid valve is normal in structure. Tricuspid valve regurgitation is not demonstrated. No evidence of tricuspid stenosis. Aortic Valve: The aortic valve is normal in structure. Aortic valve regurgitation is not visualized. No aortic stenosis is present. Pulmonic Valve: The pulmonic valve was normal in structure. Pulmonic valve regurgitation is not visualized. No evidence of pulmonic stenosis. Aorta: The aortic root is normal in size and structure. Venous: The inferior vena cava is normal in size with greater than 50% respiratory variability, suggesting right atrial pressure of 3 mmHg. IAS/Shunts: No atrial level shunt detected by color flow Doppler.  LEFT VENTRICLE PLAX 2D LVIDd:         4.80 cm  Diastology LVIDs:         4.10 cm     LV e' medial:    4.46 cm/s LV PW:         1.30 cm      LV E/e' medial:  11.2 LV IVS:        1.20 cm     LV e' lateral:   6.53 cm/s LVOT diam:     1.90 cm     LV E/e' lateral: 7.7 LV SV:         76 LV SV Index:   38 LVOT Area:     2.84 cm  LV Volumes (MOD) LV vol d, MOD A4C: 84.8 ml LV vol s, MOD A4C: 45.6 ml LV SV MOD A4C:     84.8 ml RIGHT VENTRICLE             IVC RV S prime:     15.20 cm/s  IVC diam: 1.70 cm TAPSE (M-mode): 2.1 cm LEFT ATRIUM             Index       RIGHT ATRIUM           Index LA diam:        3.70 cm 1.87 cm/m  RA Area:     13.40 cm LA Vol (A2C):   50.7 ml 25.63 ml/m RA Volume:   32.30 ml  16.33 ml/m LA Vol (A4C):   54.3 ml 27.45 ml/m LA Biplane Vol: 55.8 ml 28.20 ml/m  AORTIC VALVE LVOT Vmax:   117.00 cm/s LVOT Vmean:  74.500 cm/s LVOT VTI:    0.267 m  AORTA Ao Root diam: 3.10 cm Ao Asc diam:  2.80 cm MITRAL VALVE MV Area (PHT): 2.62 cm    SHUNTS MV Decel Time: 289 msec    Systemic VTI:  0.27 m MV E velocity: 50.10 cm/s  Systemic Diam: 1.90 cm MV A velocity: 69.70 cm/s MV E/A ratio:  0.72 Mihai Croitoru MD Electronically signed by Thurmon Fair MD Signature Date/Time: 04/04/2021/11:29:19 AM    Final    CT HEAD CODE STROKE WO CONTRAST  Result Date: 04/04/2021 CLINICAL DATA:  Code stroke.  Ataxia and dysarthria. EXAM: CT HEAD WITHOUT CONTRAST CT ANGIOGRAPHY OF THE HEAD AND NECK TECHNIQUE: Contiguous axial images were obtained from the base of the skull through the vertex without intravenous contrast. Multidetector CT imaging of the head and neck was performed using the standard protocol during bolus administration of intravenous contrast. Multiplanar CT image reconstructions and MIPs were obtained to evaluate the vascular anatomy. Carotid stenosis measurements (when applicable) are obtained utilizing NASCET criteria, using the distal internal carotid diameter as the denominator. CONTRAST:  75mL OMNIPAQUE IOHEXOL 350 MG/ML SOLN COMPARISON:  None. FINDINGS: CT HEAD FINDINGS Brain: There is no mass, hemorrhage or extra-axial collection. The  size and configuration of the ventricles and extra-axial CSF spaces are normal. The brain parenchyma is normal, without evidence of acute or chronic infarction. Vascular: No abnormal hyperdensity of the major intracranial arteries or dural venous sinuses. No intracranial atherosclerosis. Skull: The visualized skull base, calvarium and extracranial soft tissues are normal. Sinuses/Orbits: No fluid levels or advanced mucosal thickening of the visualized paranasal sinuses. No mastoid or middle ear effusion. The orbits are normal. ASPECTS (Alberta Stroke Program Early CT Score) - Ganglionic level infarction (caudate, lentiform nuclei, internal capsule, insula, M1-M3 cortex): 7 - Supraganglionic infarction (M4-M6 cortex): 3 Total score (0-10 with 10 being normal): 10 CTA NECK FINDINGS SKELETON: There is  no bony spinal canal stenosis. No lytic or blastic lesion. OTHER NECK: Normal pharynx, larynx and major salivary glands. No cervical lymphadenopathy. Unremarkable thyroid gland. UPPER CHEST: No pneumothorax or pleural effusion. No nodules or masses. AORTIC ARCH: There is no calcific atherosclerosis of the aortic arch. There is no aneurysm, dissection or hemodynamically significant stenosis of the visualized portion of the aorta. Conventional 3 vessel aortic branching pattern. The visualized proximal subclavian arteries are widely patent. RIGHT CAROTID SYSTEM: Normal without aneurysm, dissection or stenosis. LEFT CAROTID SYSTEM: Normal without aneurysm, dissection or stenosis. VERTEBRAL ARTERIES: Left dominant configuration. Both origins are clearly patent. There is no dissection, occlusion or flow-limiting stenosis to the skull base (V1-V3 segments). CTA HEAD FINDINGS POSTERIOR CIRCULATION: --Vertebral arteries: Normal V4 segments. --Inferior cerebellar arteries: Normal. --Basilar artery: Normal. --Superior cerebellar arteries: Normal. --Posterior cerebral arteries (PCA): Mild distal left PCA narrowing. Normal right.  ANTERIOR CIRCULATION: --Intracranial internal carotid arteries: Normal. --Anterior cerebral arteries (ACA): Normal. Both A1 segments are present. Patent anterior communicating artery (a-comm). --Middle cerebral arteries (MCA): Normal. VENOUS SINUSES: As permitted by contrast timing, patent. ANATOMIC VARIANTS: Hypoplastic left ACA A1 segment, a common variant. Review of the MIP images confirms the above findings. IMPRESSION: 1. No intracranial hemorrhage.  ASPECTS is 10. 2. No emergent large vessel occlusion or hemodynamically significant stenosis by NASCET criteria. 3. Normal carotid and vertebral arteries. These results were called by telephone at the time of interpretation on 04/04/2021 at 1:14 am to provider [facility] , who verbally acknowledged these results. Electronically Signed   By: Deatra RobinsonKevin  Herman M.D.   On: 04/04/2021 01:18   CT ANGIO HEAD CODE STROKE  Result Date: 04/04/2021 CLINICAL DATA:  Code stroke.  Ataxia and dysarthria. EXAM: CT HEAD WITHOUT CONTRAST CT ANGIOGRAPHY OF THE HEAD AND NECK TECHNIQUE: Contiguous axial images were obtained from the base of the skull through the vertex without intravenous contrast. Multidetector CT imaging of the head and neck was performed using the standard protocol during bolus administration of intravenous contrast. Multiplanar CT image reconstructions and MIPs were obtained to evaluate the vascular anatomy. Carotid stenosis measurements (when applicable) are obtained utilizing NASCET criteria, using the distal internal carotid diameter as the denominator. CONTRAST:  75mL OMNIPAQUE IOHEXOL 350 MG/ML SOLN COMPARISON:  None. FINDINGS: CT HEAD FINDINGS Brain: There is no mass, hemorrhage or extra-axial collection. The size and configuration of the ventricles and extra-axial CSF spaces are normal. The brain parenchyma is normal, without evidence of acute or chronic infarction. Vascular: No abnormal hyperdensity of the major intracranial arteries or dural venous sinuses.  No intracranial atherosclerosis. Skull: The visualized skull base, calvarium and extracranial soft tissues are normal. Sinuses/Orbits: No fluid levels or advanced mucosal thickening of the visualized paranasal sinuses. No mastoid or middle ear effusion. The orbits are normal. ASPECTS (Alberta Stroke Program Early CT Score) - Ganglionic level infarction (caudate, lentiform nuclei, internal capsule, insula, M1-M3 cortex): 7 - Supraganglionic infarction (M4-M6 cortex): 3 Total score (0-10 with 10 being normal): 10 CTA NECK FINDINGS SKELETON: There is no bony spinal canal stenosis. No lytic or blastic lesion. OTHER NECK: Normal pharynx, larynx and major salivary glands. No cervical lymphadenopathy. Unremarkable thyroid gland. UPPER CHEST: No pneumothorax or pleural effusion. No nodules or masses. AORTIC ARCH: There is no calcific atherosclerosis of the aortic arch. There is no aneurysm, dissection or hemodynamically significant stenosis of the visualized portion of the aorta. Conventional 3 vessel aortic branching pattern. The visualized proximal subclavian arteries are widely patent. RIGHT CAROTID SYSTEM: Normal without aneurysm,  dissection or stenosis. LEFT CAROTID SYSTEM: Normal without aneurysm, dissection or stenosis. VERTEBRAL ARTERIES: Left dominant configuration. Both origins are clearly patent. There is no dissection, occlusion or flow-limiting stenosis to the skull base (V1-V3 segments). CTA HEAD FINDINGS POSTERIOR CIRCULATION: --Vertebral arteries: Normal V4 segments. --Inferior cerebellar arteries: Normal. --Basilar artery: Normal. --Superior cerebellar arteries: Normal. --Posterior cerebral arteries (PCA): Mild distal left PCA narrowing. Normal right. ANTERIOR CIRCULATION: --Intracranial internal carotid arteries: Normal. --Anterior cerebral arteries (ACA): Normal. Both A1 segments are present. Patent anterior communicating artery (a-comm). --Middle cerebral arteries (MCA): Normal. VENOUS SINUSES: As  permitted by contrast timing, patent. ANATOMIC VARIANTS: Hypoplastic left ACA A1 segment, a common variant. Review of the MIP images confirms the above findings. IMPRESSION: 1. No intracranial hemorrhage.  ASPECTS is 10. 2. No emergent large vessel occlusion or hemodynamically significant stenosis by NASCET criteria. 3. Normal carotid and vertebral arteries. These results were called by telephone at the time of interpretation on 04/04/2021 at 1:14 am to provider ERIC [facility] , who verbally acknowledged these results. Electronically Signed   By: Deatra Robinson M.D.   On: 04/04/2021 01:18   CT ANGIO NECK CODE STROKE  Result Date: 04/04/2021 CLINICAL DATA:  Code stroke.  Ataxia and dysarthria. EXAM: CT HEAD WITHOUT CONTRAST CT ANGIOGRAPHY OF THE HEAD AND NECK TECHNIQUE: Contiguous axial images were obtained from the base of the skull through the vertex without intravenous contrast. Multidetector CT imaging of the head and neck was performed using the standard protocol during bolus administration of intravenous contrast. Multiplanar CT image reconstructions and MIPs were obtained to evaluate the vascular anatomy. Carotid stenosis measurements (when applicable) are obtained utilizing NASCET criteria, using the distal internal carotid diameter as the denominator. CONTRAST:  32mL OMNIPAQUE IOHEXOL 350 MG/ML SOLN COMPARISON:  None. FINDINGS: CT HEAD FINDINGS Brain: There is no mass, hemorrhage or extra-axial collection. The size and configuration of the ventricles and extra-axial CSF spaces are normal. The brain parenchyma is normal, without evidence of acute or chronic infarction. Vascular: No abnormal hyperdensity of the major intracranial arteries or dural venous sinuses. No intracranial atherosclerosis. Skull: The visualized skull base, calvarium and extracranial soft tissues are normal. Sinuses/Orbits: No fluid levels or advanced mucosal thickening of the visualized paranasal sinuses. No mastoid or middle ear effusion.  The orbits are normal. ASPECTS ([place] Stroke Program Early CT Score) - Ganglionic level infarction (caudate, lentiform nuclei, internal capsule, insula, M1-M3 cortex): 7 - Supraganglionic infarction (M4-M6 cortex): 3 Total score (0-10 with 10 being normal): 10 CTA NECK FINDINGS SKELETON: There is no bony spinal canal stenosis. No lytic or blastic lesion. OTHER NECK: Normal pharynx, larynx and major salivary glands. No cervical lymphadenopathy. Unremarkable thyroid gland. UPPER CHEST: No pneumothorax or pleural effusion. No nodules or masses. AORTIC ARCH: There is no calcific atherosclerosis of the aortic arch. There is no aneurysm, dissection or hemodynamically significant stenosis of the visualized portion of the aorta. Conventional 3 vessel aortic branching pattern. The visualized proximal subclavian arteries are widely patent. RIGHT CAROTID SYSTEM: Normal without aneurysm, dissection or stenosis. LEFT CAROTID SYSTEM: Normal without aneurysm, dissection or stenosis. VERTEBRAL ARTERIES: Left dominant configuration. Both origins are clearly patent. There is no dissection, occlusion or flow-limiting stenosis to the skull base (V1-V3 segments). CTA HEAD FINDINGS POSTERIOR CIRCULATION: --Vertebral arteries: Normal V4 segments. --Inferior cerebellar arteries: Normal. --Basilar artery: Normal. --Superior cerebellar arteries: Normal. --Posterior cerebral arteries (PCA): Mild distal left PCA narrowing. Normal right. ANTERIOR CIRCULATION: --Intracranial internal carotid arteries: Normal. --Anterior cerebral arteries (ACA): Normal. Both A1 segments  are present. Patent anterior communicating artery (a-comm). --Middle cerebral arteries (MCA): Normal. VENOUS SINUSES: As permitted by contrast timing, patent. ANATOMIC VARIANTS: Hypoplastic left ACA A1 segment, a common variant. Review of the MIP images confirms the above findings. IMPRESSION: 1. No intracranial hemorrhage.  ASPECTS is 10. 2. No emergent large vessel occlusion  or hemodynamically significant stenosis by NASCET criteria. 3. Normal carotid and vertebral arteries. These results were called by telephone at the time of interpretation on 04/04/2021 at 1:14 am to provider ERIC [facility] , who verbally acknowledged these results. Electronically Signed   By: Deatra Robinson M.D.   On: 04/04/2021 01:18      Wren Gallaga T. Verna Desrocher [facility]  If 7PM-7AM, please contact night-coverage [email or web address] 04/04/2021, 1:26 PM

## 2021-04-04 NOTE — Evaluation (Signed)
Speech Language Pathology Evaluation Patient Details Name: Bryan Crane MRN: [id number] DOB: 26-May-1946 Today's Date: 04/04/2021 Time: 6546-5035 SLP Time Calculation (min) (ACUTE ONLY): 20 min  Problem List:  Patient Active Problem List   Diagnosis Date Noted  . 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 Dysarthria 04/04/2021  . Essential hypertension 04/04/2021  . CKD (chronic kidney disease) stage 3, GFR 30-59 ml/min (HCC) 04/04/2021  . Closed fracture of left olecranon process 09/30/2018   Past Medical History:  Past Medical History:  Diagnosis Date  . Arthritis   . Cardiomyopathy (HCC)   . CKD (chronic kidney disease)   . Dyspnea    with exertion  . Enlarged prostate   . GERD (gastroesophageal reflux disease)   . History of kidney stones   . Hypertension   . Sleep apnea    uses cpap   Past Surgical History:  Past Surgical History:  Procedure Laterality Date  . CARDIAC CATHETERIZATION     "years ago" - states it was clear  . COLONOSCOPY    . KIDNEY STONE SURGERY    . ORIF ELBOW FRACTURE Left 09/30/2018   Procedure: Open reduction internal fixation left elbow fracture with repair reconstruction and  allograft;  Surgeon: Dominica Severin, MD;  Location: [facility] OR;  Service: Orthopedics;  Laterality: Left;  90 mins  . TONSILLECTOMY     HPI:  75 year old M with PMH of cardiomyopathy, OSA on CPAP, CKD-3A, obesity, HTN and GERD presenting with sudden onset of an STD gait, slurred speech, dizziness and headache and found to have small right cerebellar CVA on MRI   Assessment / Plan / Recommendation Clinical Impression  Pt presents with mild cognitive deficits post CVA and mild dysarthria of speech. [facility] Mental Status administered (SLUMS) 27/30 with mild deficits in delayed recall and mental manipulation. Per PT notes, pt did exhibit decreased insight to deficits, safety, and needed cues for problem solving. PLOF pt very  independent, retired, & lives with spouse. Dyarthria of speech c/b episodic moments of reduced artriculation of sounds at times impairing communication intelligibility at the word level. Pt remained largely intelligible overall. Receptive and expressive language skills were intact. SLP to follow up for cognitive linguistic intervention.    SLP Assessment  SLP Recommendation/Assessment: Patient needs continued Speech Lanaguage Pathology Services SLP Visit Diagnosis: Dysarthria and anarthria (R47.1);Cognitive communication deficit (R41.841)    Follow Up Recommendations  Inpatient Rehab    Frequency and Duration min 1 x/week  1 week      SLP Evaluation Cognition  Overall Cognitive Status: Impaired/Different from baseline Arousal/Alertness: Awake/alert Orientation Level: Oriented to place;Oriented to person;Oriented to time;Oriented to situation Attention: Focused;Alternating Focused Attention: Impaired Focused Attention Impairment: Verbal complex;Functional complex Alternating Attention: Impaired Alternating Attention Impairment: Verbal complex;Functional complex Memory: Impaired Memory Impairment: Decreased recall of new information Problem Solving: Impaired Problem Solving Impairment: Verbal complex;Functional complex Executive Function: Organizing;Self Monitoring Organizing: Impaired Organizing Impairment: Verbal complex;Functional complex Self Monitoring: Impaired Self Monitoring Impairment: Verbal complex;Functional complex Safety/Judgment: Impaired Comments: per PT notes       Comprehension  Auditory Comprehension Overall Auditory Comprehension: Appears within functional limits for tasks assessed Visual Recognition/Discrimination Discrimination: Within Function Limits Reading Comprehension Reading Status: Within funtional limits    Expression Expression Primary Mode of Expression: Verbal Verbal Expression Overall Verbal Expression: Appears within functional limits  for tasks assessed Written Expression Dominant Hand: Left   Oral / Motor  Oral Motor/Sensory Function Overall Oral Motor/Sensory Function: Within functional limits Motor Speech  Overall Motor Speech: Impaired Respiration: Within functional limits Phonation: Normal Resonance: Within functional limits Articulation: Impaired Level of Impairment: Word Intelligibility: Intelligible Motor Planning: Witnin functional limits Motor Speech Errors: Not applicable Effective Techniques: Over-articulate;Slow rate;Increased vocal intensity   GO                   Ardyth Gal MA, CCC-SLP Acute Rehabilitation Services   04/04/2021, 2:01 PM

## 2021-04-04 NOTE — ED Notes (Signed)
Report called to [place], RN receiving pt.

## 2021-04-04 NOTE — Progress Notes (Signed)
  Echocardiogram 2D Echocardiogram has been performed.  Bryan Crane 04/04/2021, 9:46 AM

## 2021-04-04 NOTE — Progress Notes (Signed)
RT set up patient's home CPAP and placed at bedside.  Patient stated he will place himself on CPAP when ready for bed. RT will monitor as needed.

## 2021-04-04 NOTE — Consult Note (Addendum)
Referring Physician: Dr. Adela Lank    Chief Complaint: Acute onset of dizziness and gait unsteadiness  HPI: Bryan Crane is an 75 y.o. male with a PMHx of cardiomyopahty, CKD, dyspnea with exertion, renal stones, HTN and sleep apnea who presents after sudden onset of dizziness, dysarthria and gait unsteadiness while at a gathering this evening. The patient was brought to the ED via EMS. On arrival he complained of slight headache.  Code Stroke was called in the ED.   Vitals: BP 180/79, HR 59, Temp 97.9, RR 17, Satting 99% on RA.   ASA is listed as a home medication in Epic; however, the patient states he stopped taking it on the advice of his physician. He denies having any allergies or bleeding issues with ASA.   LSN: 10:10 PM tPA Given: No: The patient elected not to be treated with tPA after discussion of risks/benefits NIHSS: 3 mRS: 0  Past Medical History:  Diagnosis Date  . Arthritis   . Cardiomyopathy (HCC)   . CKD (chronic kidney disease)   . Dyspnea    with exertion  . Enlarged prostate   . GERD (gastroesophageal reflux disease)   . History of kidney stones   . Hypertension   . Sleep apnea    uses cpap    Past Surgical History:  Procedure Laterality Date  . CARDIAC CATHETERIZATION     "years ago" - states it was clear  . COLONOSCOPY    . KIDNEY STONE SURGERY    . ORIF ELBOW FRACTURE Left 09/30/2018   Procedure: Open reduction internal fixation left elbow fracture with repair reconstruction and  allograft;  Surgeon: Dominica Severin, MD;  Location: [facility] OR;  Service: Orthopedics;  Laterality: Left;  90 mins  . TONSILLECTOMY      History reviewed. No pertinent family history. Social History:  reports that he has never smoked. He has never used smokeless tobacco. He reports that he does not drink alcohol and does not use drugs.  Allergies: No Known Allergies  Home Medications: No current facility-administered medications on file prior to encounter.   Current  Outpatient Medications on File Prior to Encounter  Medication Sig Dispense Refill  . acetaminophen (TYLENOL) 500 MG tablet Take 500 mg by mouth 2 (two) times daily as needed for moderate pain or headache.    Marland Kitchen aspirin EC 81 MG tablet Take 81 mg by mouth every evening.    Marland Kitchen HYDROcodone-acetaminophen (NORCO/VICODIN) 5-325 MG tablet Take 1 tablet by mouth every 6 (six) hours as needed for moderate pain.    Marland Kitchen irbesartan (AVAPRO) 300 MG tablet Take 300 mg by mouth every evening.    . lansoprazole (PREVACID) 30 MG capsule Take 30 mg by mouth daily at 12 noon.    . latanoprost (XALATAN) 0.005 % ophthalmic solution Place 1 drop into both eyes at bedtime.  6  . Multiple Vitamin (MULTIVITAMIN WITH MINERALS) TABS tablet Take 1 tablet by mouth daily.    Marland Kitchen RANEXA 1000 MG SR tablet Take 1,000 mg by mouth daily.   3  . sodium chloride (OCEAN) 0.65 % SOLN nasal spray Place 1 spray into both nostrils as needed for congestion.    . tamsulosin (FLOMAX) 0.4 MG CAPS capsule Take 0.8 mg by mouth every evening.      ROS: Headache now resolved. No neck stiffness. No fever. Has history of chronic cough. No SOB or CP. No limb weakness or numbness. Other ROS as per HPI.   Physical Examination: Blood pressure (!) 190/80,  pulse (!) 59, temperature 97.9 F (36.6 C), temperature source Oral, resp. rate 17, height 5\' 6"  (1.676 m), weight 88.5 kg, SpO2 99 %.  HEENT: [place]/AT. Left scleral injection is noted.  Lungs: Respirations unlabored Ext: No edema  Neurologic Examination: Mental Status: Awake and alert. Normal attention. Oriented x 5. Speech mildly dysarthric with a cerebellar quality (scanning speech). Speech is fluent with intact comprehension and naming. Repetition intact.  Cranial Nerves: II:  Visual fields intact. No extinction to DSS. PERRL.  III,IV, VI: No ptosis. EOM are full. There is rightward rotatory nystagmus with eyes at the midline and with rightward gaze, which extinguishes with leftward gaze.  V,VII:  Temp sensation equal bilaterally. Smile symmetric. VIII: Hearing normal intact to voice IX,X: No hypophonia or hoarseness XI: Symmetric shoulder shrug XII: Midline tongue extension  Motor: Right : Upper extremity   5/5    Left:     Upper extremity   5/5  Lower extremity   5/5     Lower extremity   5/5 No pronator drift Sensory: Temp and light touch intact throughout, bilaterally. No extinction to DSS.  Deep Tendon Reflexes:  2+ bilateral brachioradialis and biceps 3+ bilateral patellae 1+ bilateral achilles Right toe downgoing, left toe equivocal Cerebellar: Ataxia is noted with FNF, RAM and H-S on the right. No ataxia on the left.  Gait: Deferred  Results for orders placed or performed during the hospital encounter of 04/03/21 (from the past 48 hour(s))  CBG monitoring, ED     Status: Abnormal   Collection Time: 04/04/21 12:36 AM  Result Value Ref Range   Glucose-Capillary 123 (H) 70 - 99 mg/dL    Comment: Glucose reference range applies only to samples taken after fasting for at least 8 hours.   No results found.  Assessment: 75 y.o. male presenting with acute onset of dizziness, dysarthria and gait unsteadiness 1. Exam reveals rotatory nystagmus to the right, dysarthria and mild right hemiataxia. Overall symptoms and signs are most consistent with an acute ischemic stroke involving the right cerebellar hemisphere. NIHSS 3.  2. CT head: No acute abnormality 3. CTA of head and neck: No LVO.  4. Stroke Risk Factors - cardiomyopahty, CKD, dyspnea with exertion, renal stones, HTN and sleep apnea 5. After extensive discussion of risks/benefits, the patient elected not to be treated with tPA due to concern for possible disabling intracranial hemorrhage. All questions answered.   Recommendations: 1. HgbA1c, fasting lipid panel 2. MRI of the brain without contrast 3. PT consult, OT consult, Speech consult 4. Echocardiogram 5. 650 mg ASA crushed x 1 now. Continue with daily ASA 81 mg  po qd.  6. Modified permissive HTN x 24 hours (advanced age). Treat SBP if > 180 7. Statin 8. Risk factor modification 9. Telemetry monitoring 10. Frequent neuro checks  @Electronically  signed: Dr. 61  04/04/2021, 12:52 AM

## 2021-04-05 DIAGNOSIS — I639 Cerebral infarction, unspecified: Secondary | ICD-10-CM

## 2021-04-05 MED ORDER — ATORVASTATIN CALCIUM 40 MG PO TABS
40.0000 mg | ORAL_TABLET | Freq: Every day | ORAL | Status: DC
  Administered 2021-04-06: 40 mg via ORAL
  Filled 2021-04-05: qty 1

## 2021-04-05 NOTE — Evaluation (Signed)
Occupational Therapy Evaluation Patient Details Name: Bryan Crane MRN: [id number] DOB: [birth year] Today's Date: 04/05/2021    History of Present Illness The pt is a 75 yo male presenting 6/3 due to acute onset of dizziness and balance issues. 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 PMH includes: HTN, cardiomyopathy, CKD III, and kidney stones.   Clinical Impression   Pt PTA: Pt living with spouse and reports independence with ADL and mobility. Pt currently scored a perfect score on Short Blessed Test revealing no memory deficit and able to perform cognitive tasks while ambulating in a distracting environment. Pt requiring cues for mobility with RW and safety. Pt performing bed mobility with supervisionA and mobility with minguardA to minA overall with RW.  Pt set-upA to minA for OOB ADL. Pt appears strong and with minimal balance deficits, but could benefit from higher level ADL and challenging balance tasks. Pt would benefit form post acute care therapy. OT following acutely.    Follow Up Recommendations  CIR    Equipment Recommendations  3 in 1 bedside commode    Recommendations for Other Services       Precautions / Restrictions Precautions Precautions: Fall Restrictions Weight Bearing Restrictions: No      Mobility Bed Mobility Overal bed mobility: Needs Assistance Bed Mobility: Supine to Sit     Supine to sit: Supervision          Transfers Overall transfer level: Needs assistance Equipment used: Rolling walker (2 wheeled);1 person hand held assist Transfers: Sit to/from Stand;Stand Pivot Transfers Sit to Stand: Min guard Stand pivot transfers: Min assist       General transfer comment: minA for stability with LOB x1 at commode    Balance Overall balance assessment: Needs assistance Sitting-balance support: Single extremity supported;Feet supported Sitting balance-Leahy Scale: Good       Standing balance-Leahy Scale: Poor Standing balance  comment: reliant on BUE support and minguardA                           ADL either performed or assessed with clinical judgement   ADL Overall ADL's : Needs assistance/impaired Eating/Feeding: Set up;Sitting   Grooming: Min guard;Standing   Upper Body Bathing: Min guard;Standing   Lower Body Bathing: Minimal assistance;Sitting/lateral leans;Sit to/from stand   Upper Body Dressing : Min guard;Standing   Lower Body Dressing: Minimal assistance;Sitting/lateral leans;Sit to/from stand;Cueing for safety   Toilet Transfer: Minimal assistance;Cueing for safety;Cueing for sequencing;Ambulation;Regular Toilet;RW;Grab bars Toilet Transfer Details (indicate cue type and reason): cues for hand placement Toileting- Clothing Manipulation and Hygiene: Minimal assistance;Cueing for safety;Sitting/lateral lean;Sit to/from stand Toileting - Clothing Manipulation Details (indicate cue type and reason): LOB x1 in standing     Functional mobility during ADLs: Minimal assistance;Rolling walker;Cueing for safety;Cueing for sequencing General ADL Comments: Pt requiring cues for mobility with RW and safety. Pt performing bed mobility with supervisionA and mobility iwth minguardA to minA overall with RW.  Pt stepping around bed to recliner with no AD, but use of furniture for stability. Pt appears strong and with minimal balance deficits, but could benefit from higher level ADL and challenging balance tasks.     Vision Baseline Vision/History: No visual deficits Patient Visual Report: No change from baseline Vision Assessment?: Yes Eye Alignment: Within Functional Limits Ocular Range of Motion: Within Functional Limits Alignment/Gaze Preference: Within Defined Limits Tracking/Visual Pursuits: Able to track stimulus in all quads without difficulty     Perception  Praxis      Pertinent Vitals/Pain Pain Assessment: No/denies pain     Hand Dominance Left   Extremity/Trunk Assessment  Upper Extremity Assessment Upper Extremity Assessment: Overall WFL for tasks assessed;RUE deficits/detail;LUE deficits/detail RUE Deficits / Details: strength 4/5 RUE Sensation: WNL RUE Coordination: WNL LUE Deficits / Details: strength 4/5 LUE Sensation: WNL LUE Coordination: WNL   Lower Extremity Assessment Lower Extremity Assessment: Overall WFL for tasks assessed;Defer to PT evaluation   Cervical / Trunk Assessment Cervical / Trunk Assessment: Normal   Communication Communication Communication: No difficulties   Cognition Arousal/Alertness: Awake/alert Behavior During Therapy: WFL for tasks assessed/performed Overall Cognitive Status: Within Functional Limits for tasks assessed                                 General Comments: Pt A/O x4 with cues. Pt following all commands and aware that he is moving better than a few days ago. Pt scored a perfect score on Short Blessed Test revealing no memory deficit and able to perform task while ambulating in a distracting environment.   General Comments  VSS.    Exercises     Shoulder Instructions      Home Living   Living Arrangements: Spouse/significant other Available Help at Discharge: Family;Available 24 hours/day Type of Home: House Home Access: Stairs to enter [employer] of Steps: 3 in front. No steps in back Entrance Stairs-Rails: Right;Left;Can reach both Home Layout: One level     Bathroom Shower/Tub: Chief Strategy Officer: Handicapped height Bathroom Accessibility: Yes How Accessible: Accessible via walker        Lives With: Spouse    Prior Functioning/Environment Level of Independence: Independent        Comments: independent with all activity, walking ~1 hour at a time and biking at house for activity        OT Problem List: Decreased activity tolerance;Impaired balance (sitting and/or standing);Decreased safety awareness;Decreased knowledge of use of DME or  AE      OT Treatment/Interventions: Self-care/ADL training;Therapeutic exercise;Energy conservation;DME and/or AE instruction;Therapeutic activities;Patient/family education;Balance training    OT Goals(Current goals can be found in the care plan section) Acute Rehab OT Goals Patient Stated Goal: go to CIR before home OT Goal Formulation: With patient Time For Goal Achievement: 04/19/21 Potential to Achieve Goals: Good ADL Goals Pt Will Perform Grooming: with supervision;standing Pt Will Perform Lower Body Dressing: with set-up;sitting/lateral leans;sit to/from stand Additional ADL Goal #1: Pt will increase to x10 mins of OOB Adl with supervisionA with no LOB episodes. Additional ADL Goal #2: Pt will increase to supervisionA for ADL functional mobility in room with no LOB epsiodes and least restrictive AD.  OT Frequency: Min 2X/week   Barriers to D/C:            Co-evaluation              AM-PAC OT "6 Clicks" Daily Activity     Outcome Measure Help from another person eating meals?: None Help from another person taking care of personal grooming?: A Little Help from another person toileting, which includes using toliet, bedpan, or urinal?: A Little Help from another person bathing (including washing, rinsing, drying)?: A Little Help from another person to put on and taking off regular upper body clothing?: None Help from another person to put on and taking off regular lower body clothing?: A Little 6 Click Score: 20   End of  Session Equipment Utilized During Treatment: Gait belt;Rolling walker Nurse Communication: Mobility status  Activity Tolerance: Patient tolerated treatment well Patient left: in chair;with call bell/phone within reach;with family/visitor present  OT Visit Diagnosis: Unsteadiness on feet (R26.81);Muscle weakness (generalized) (M62.81)                Time: 2620-3559 OT Time Calculation (min): 32 min Charges:  OT General Charges $OT Visit: 1  Visit OT Evaluation $OT Eval Moderate Complexity: 1 Mod OT Treatments $Self Care/Home Management : 8-22 mins  Flora Lipps, OTR/L Acute Rehabilitation Services Pager: [phone] Office: [phone]  Lonzo Cloud 04/05/2021, 2:48 PM

## 2021-04-05 NOTE — Progress Notes (Signed)
Inpatient Rehab Admissions Coordinator Note:   Per PT/ST recommendations, pt was screened for CIR candidacy by Wolfgang Phoenix, MS, CCC-SLP.  At this time we are recommending an inpatient rehab consult.  AC will place consult order per protocol.  Please contact me with questions.    Wolfgang Phoenix, MS, CCC-SLP Admissions Coordinator [phone] 04/05/21 11:08 AM

## 2021-04-05 NOTE — Progress Notes (Signed)
Inpatient Rehab Admissions:  Inpatient Rehab Consult received.  I met with patient and his wife at the bedside for rehabilitation assessment and to discuss goals and expectations of an inpatient rehab admission.  Both acknowledged understanding of goals and expectations. Both interested in pt pursuing CIR.  Will continue to follow.  Signed: Menashe Kafer Graves Madden, MS, CCC-SLP Admissions Coordinator [phone]   

## 2021-04-05 NOTE — Progress Notes (Addendum)
STROKE TEAM PROGRESS NOTE   INTERVAL HISTORY His wife and RN is at the bedside. Awaiting Rehab evals, but says he still feels off balance with standing and walking to BR with assistance. I discussed stroke work up and cryptogenic stroke. Pt says that he does feel palpitations from time to time and has been worked up for Afib previously, but not recently w/long term Emergency planning/management officer. His cardiologist is in [place], where he previously lived (not seen locally by anyone).  Vitals:   04/04/21 2015 04/04/21 2321 04/05/21 0330 04/05/21 0832  BP:  (!) 145/68 (!) 142/73 (!) 160/89  Pulse: 69 (!) 58 (!) 52 (!) 57  Resp: 16 18 18 20   Temp:  98.2 F (36.8 C) 98.8 F (37.1 C) 98.9 F (37.2 C)  TempSrc:    Oral  SpO2: 97% 99% 100% 100%  Weight:      Height:       CBC:  Recent Labs  Lab 04/04/21 0030 04/04/21 0053  WBC 9.5  --   NEUTROABS 6.4  --   HGB 16.7 16.7  HCT 48.0 49.0  MCV 90.9  --   PLT 304  --    Basic Metabolic Panel:  Recent Labs  Lab 04/04/21 0030 04/04/21 0053  NA 137 141  K 4.5 4.0  CL 106 107  CO2 22  --   GLUCOSE 107* 107*  BUN 20 22  CREATININE 1.83* 1.80*  CALCIUM 9.4  --   Lipid Panel:  Recent Labs  Lab 04/04/21 0649  CHOL 187  TRIG 167*  HDL 40*  CHOLHDL 4.7  VLDL 33  LDLCALC 06/04/21*   HgbA1c: No results for input(s): HGBA1C in the last 168 hours. Urine Drug Screen:  Recent Labs  Lab 04/04/21 0150  LABOPIA NONE DETECTED  COCAINSCRNUR NONE DETECTED  LABBENZ NONE DETECTED  AMPHETMU NONE DETECTED  THCU NONE DETECTED  LABBARB NONE DETECTED    Alcohol Level  Recent Labs  Lab 04/04/21 0030  ETH <10   PHYSICAL EXAM General: Appears well-developed, no acute distress. Psych: Affect appropriate to situation Eyes: No scleral injection HENT: No OP obstrucion Head: Normocephalic.  Cardiovascular: Normal rate and regular rhythm.  Respiratory: Effort normal and breath sounds normal to anterior ascultation GI: Soft.  No distension. There is no tenderness.   Skin: WDI    Neurological Examination Mental Status: Alert, oriented, thought content appropriate.  Speech fluent without evidence of aphasia. Able to follow 3 step commands without difficulty. Cranial Nerves: II: Visual fields grossly normal,  III,IV, VI: ptosis not present, extra-ocular motions intact bilaterally, pupils equal, round, reactive to light and accommodation.There was slight horizontal nystagmus noted. V,VII: smile symmetric, facial light touch sensation normal bilaterally VIII: hearing normal bilaterally IX,X: uvula rises symmetrically XI: bilateral shoulder shrug XII: midline tongue extension Motor: Right : Upper extremity   5/5    Left:     Upper extremity   5/5  Lower extremity   5/5     Lower extremity   5/5 Tone and bulk:normal tone throughout; no atrophy noted Sensory: Pinprick and light touch intact throughout, bilaterally Deep Tendon Reflexes: 2+ and symmetric throughout Plantars: Right: downgoing   Left: downgoing Cerebellar: Perhaps very slight ataxia on FTN in right. Otherwise normal finger-to-nose on left, normal rapid alternating movements and normal heel-to-shin test Gait: deferred  ASSESSMENT/PLAN Bryan Crane is a 75 y.o. male presenting with acute onset of dizziness, balance issue with unsteady gait, slight dysarthria.   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 Recommend Loop placement   Code Stroke CT head No acute abnormality. ASPECTS 10.   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  VTE prophylaxis - Lovenox    Diet   Diet Heart Room service appropriate? Yes; Fluid consistency: Thin   Not on any antiplt or OAC prior to admission, now on DAPT x 3 weeks, then monotherapy with ASA  only unless Afib is seen, then [facility] indicated  Therapy recommendations:  pending  Disposition:  pending  Hypertension  Home meds:  Aldactone 25mg  daily  Stable . Permissive hypertension (OK if < 220/120) but gradually normalize in 5-7 days . 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 I have spent 76 with pt and wife explaining stroke, etiology, tx plan and reviewed imaging and all test findings as well as the plan.   -Previous EP records can be gathered if needed from his long time cardiologist in the [place] area, Dr Clementeen Hoof at [phone]  -Stroke education done with pt/wife at bedside  -Out pt f/u placed in depart with [facility] in 4 weeks. We will s/o. Please call back if needed.   Bryan Crane, ARNP-C, ANVP-BC Pager: [phone]  To contact Stroke Continuity provider, please refer to [phone]. After hours, contact General Neurology

## 2021-04-05 NOTE — Progress Notes (Signed)
PROGRESS NOTE    Bryan Crane  WUJ:[id number] DOB: April 16, 1946 DOA: 04/03/2021 PCP: Majel Homer, MD    Chief Complaint  Patient presents with  . Dizziness    Brief Narrative:  75 year old M with PMH of cardiomyopathy, OSA on CPAP, CKD-3A, obesity, HTN and GERD presenting with sudden onset of an STD gait, slurred speech, dizziness and headache and found to have small right cerebellar CVA on MRI.  Patient has not taken his blood pressure medication for few days.  PCP changed his Avapro to Aceon recently, and pharmacy did not have it to stop.  CT head and CTA head and neck without significant finding.  Neurology following. Pt seen and examined at bedside, no new complaints today.   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 Presented with unsteady gair and dysarthria.  Continue with aspirin and plavix.  Echocardiogram showed Left ventricular ejection fraction, by estimation, is 60 to 65%. The  left ventricle has normal function. The left ventricle has no regional  wall motion abnormalities. Left ventricular diastolic parameters were normal.  LDL is 114 , on statin.  Hemoglobin A1c is pending.  Therapy evaluations recommending CIR.     [facility] hypertension  permissive hypertension.    Stave 3 b CKD: Creatinine is stable around 1.8.    DVT prophylaxis: (Lovenox) Code Status: (Full code) Family Communication: (family at bedside. ) Disposition:   Status is: Inpatient  Remains inpatient appropriate because:Unsafe d/c plan   Dispo: The patient is from: Home              Anticipated d/c is to: CIR              Patient currently is medically stable to d/c.   Difficult to place patient  No       Consultants:   neurology   Procedures: MRI brain.   Antimicrobials: none.    Subjective: His speech and gait have improved.   Objective: Vitals:   04/04/21 2015 04/04/21 2321 04/05/21 0330 04/05/21 0832  BP:  (!) 145/68 (!) 142/73 (!) 160/89  Pulse: 69 (!) 58 (!) 52 (!) 57  Resp: Temp:  98.2 F (36.8 C) 98.8 F (37.1 C) 98.9 F (37.2 C)  TempSrc:    Oral  SpO2: 97% 99% 100% 100%  Weight:      Height:        Intake/Output Summary (Last 24 hours) at 04/05/2021 1211 Last data filed at 04/04/2021 1800 Gross per 24 hour  Intake 398.47 ml  Output --  Net 398.47 ml   Filed Weights   04/03/21 2336  Weight: 88.5 kg    Examination:  General exam: Appears calm and comfortable  Respiratory system: Clear to auscultation. Respiratory effort normal. Cardiovascular system: S1 & S2 heard, RRR. No JVD, . No pedal edema. Gastrointestinal system: Abdomen is nondistended, soft and nontender. Normal bowel sounds heard. Central nervous system: Alert and oriented. No focal neurological deficits. Extremities: Symmetric 5 x 5 power. Skin: No rashes, lesions or ulcers Psychiatry: Mood & affect appropriate.     Data Reviewed: I have personally reviewed following labs and imaging studies  CBC: Recent Labs  Lab 04/04/21 0030 04/04/21 0053  WBC 9.5  --   NEUTROABS 6.4  --   HGB 16.7 16.7  HCT 48.0 49.0  MCV 90.9  --   PLT 304  --     Basic Metabolic Panel: Recent Labs  Lab 04/04/21 0030 04/04/21 0053  NA 137 141  K 4.5 4.0  CL 106 107  CO2 22  --   GLUCOSE 107* 107*  BUN 20 22  CREATININE 1.83* 1.80*  CALCIUM 9.4  --     GFR: Estimated Creatinine Clearance: 37 mL/min (A) (by C-G formula based on SCr of 1.8 mg/dL (H)).  Liver Function Tests: Recent Labs  Lab 04/04/21 0030  AST 24  ALT 15  ALKPHOS 41  BILITOT 0.8  PROT 7.1  ALBUMIN 4.0    CBG: Recent Labs  Lab 04/04/21 0036  GLUCAP 123*     Recent Results (from the  past 240 hour(s))  Resp Panel by RT-PCR (Flu A&B, Covid) Nasopharyngeal Swab     Status: None   Collection Time: 04/04/21 12:22 AM   Specimen: Nasopharyngeal Swab; Nasopharyngeal(NP) swabs in vial transport medium  Result Value Ref Range Status   SARS Coronavirus 2 by RT PCR NEGATIVE NEGATIVE Final    Comment: (NOTE) SARS-CoV-2 target nucleic acids are NOT DETECTED.  The SARS-CoV-2 RNA is generally detectable in upper respiratory specimens during the acute phase of infection. The lowest concentration of SARS-CoV-2 viral copies this assay can detect is 138 copies/mL. A negative result does not preclude SARS-Cov-2 infection and should not be used as the sole basis for treatment or other patient management decisions. A negative result may occur with  improper specimen collection/handling, submission of specimen other than nasopharyngeal swab, presence of viral mutation(s) within the areas targeted by this assay, and inadequate number of viral copies(<138 copies/mL). A negative result must be combined with clinical observations, patient history, and epidemiological information. The expected result is Negative.  Fact Sheet for Patients:  BloggerCourse.com  Fact Sheet for Healthcare Providers:  SeriousBroker.it  This test is no t yet approved or cleared by the [place] FDA and  has been authorized for detection and/or diagnosis of SARS-CoV-2 by FDA under an Emergency Use Authorization (EUA). This EUA will remain  in effect (meaning this test can be used) for the duration of the COVID-19 declaration under Section 564(b)(1) of the Act, 21 U.S.C.section 360bbb-3(b)(1), unless the authorization is terminated  or revoked sooner.       Influenza A by PCR NEGATIVE NEGATIVE Final   Influenza B by PCR NEGATIVE NEGATIVE Final    Comment: (NOTE) The Xpert Xpress SARS-CoV-2/FLU/RSV plus assay is intended as an aid in the diagnosis of  influenza from Nasopharyngeal swab specimens and should not be used as a sole basis for treatment. Nasal washings and aspirates are unacceptable for Xpert Xpress SARS-CoV-2/FLU/RSV testing.  Fact Sheet for Patients: BloggerCourse.com  Fact Sheet for Healthcare Providers: SeriousBroker.it  This test is not yet approved or cleared by the [place] FDA and has been authorized for detection and/or diagnosis of SARS-CoV-2 by FDA under an Emergency Use Authorization (EUA). This EUA will remain in effect (meaning this test can be used) for the duration of the COVID-19 declaration under Section 564(b)(1) of the Act, 21 U.S.C. section 360bbb-3(b)(1), unless the authorization is terminated or revoked.  Performed at [facility], [address] [place], [place] [address]          Radiology Studies: MR BRAIN WO CONTRAST  Result Date: 04/04/2021 CLINICAL DATA:  Stroke follow-up EXAM: MRI HEAD WITHOUT CONTRAST TECHNIQUE: Multiplanar, multiecho pulse sequences of the brain and surrounding structures were obtained without intravenous contrast. COMPARISON:  CTA head neck 04/04/2021 FINDINGS: Brain: There is a small acute infarct of the anterior superior right cerebellum. No acute or chronic hemorrhage. Normal white matter signal, parenchymal volume and CSF spaces. The midline structures are normal. Vascular: Major flow voids are preserved. Skull and upper cervical spine: Normal calvarium and skull base. Visualized upper cervical spine and soft tissues are normal. Sinuses/Orbits:No paranasal sinus fluid levels or advanced mucosal thickening. No mastoid or middle ear effusion. Normal orbits. 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 No hemorrhage or mass effect. Electronically Signed   By: Deatra Robinson M.D.   On: 04/04/2021 02:32   ECHOCARDIOGRAM COMPLETE  Result Date: 04/04/2021    ECHOCARDIOGRAM REPORT    Patient Name:   RITIK STAVOLA Date of Exam: 04/04/2021 Medical Rec #:  [id number]         Height:       66.0 in Accession #:    [id number]        Weight:       195.0 lb Date of Birth:  1946/02/14         BSA:          1.978 m Patient Age:    74 years          BP:           167/78 mmHg Patient Gender: M                 HR:           55 bpm. Exam Location:  Inpatient Procedure: 2D Echo Indications:    stroke  History:        Patient has no prior history of Echocardiogram examinations.                 Cardiomyopathy, chronic kidney disease, Arrythmias:PVC; Risk                 Factors:Hypertension.  Sonographer:    Delcie Roch Referring Phys: [id number] BRADLEY S CHOTINER IMPRESSIONS  1. Left ventricular ejection fraction, by estimation, is 60 to 65%. The left ventricle has normal function. The left ventricle has no regional wall motion abnormalities. Left ventricular diastolic parameters were normal.  2. Right ventricular systolic function is normal. The right ventricular size is normal.  3. The mitral valve is normal in structure. No evidence of mitral valve regurgitation. No evidence of mitral stenosis.  4. The aortic valve is normal in structure. Aortic valve regurgitation is not visualized. No aortic stenosis is present.  5. The inferior vena cava is normal in size with greater than 50% respiratory variability, suggesting right atrial pressure of 3 mmHg. FINDINGS  Left Ventricle: Left ventricular ejection fraction, by estimation, is 60 to 65%. The left ventricle has normal function. The left ventricle has no regional wall motion abnormalities. The left ventricular internal cavity size was normal in size. There is  no left ventricular hypertrophy. Left ventricular diastolic parameters were normal. Normal left ventricular filling pressure. Right Ventricle: The right ventricular size is normal. No increase in right ventricular wall thickness. Right ventricular systolic function is normal. Left Atrium: Left atrial  size was normal in size. Right Atrium: Right atrial size was normal in size. Pericardium: There is no evidence of pericardial effusion. Mitral Valve: The mitral valve is normal in structure. No evidence of mitral  valve regurgitation. No evidence of mitral valve stenosis. Tricuspid Valve: The tricuspid valve is normal in structure. Tricuspid valve regurgitation is not demonstrated. No evidence of tricuspid stenosis. Aortic Valve: The aortic valve is normal in structure. Aortic valve regurgitation is not visualized. No aortic stenosis is present. Pulmonic Valve: The pulmonic valve was normal in structure. Pulmonic valve regurgitation is not visualized. No evidence of pulmonic stenosis. Aorta: The aortic root is normal in size and structure. Venous: The inferior vena cava is normal in size with greater than 50% respiratory variability, suggesting right atrial pressure of 3 mmHg. IAS/Shunts: No atrial level shunt detected by color flow Doppler.  LEFT VENTRICLE PLAX 2D LVIDd:         4.80 cm     Diastology LVIDs:         4.10 cm     LV e' medial:    4.46 cm/s LV PW:         1.30 cm     LV E/e' medial:  11.2 LV IVS:        1.20 cm     LV e' lateral:   6.53 cm/s LVOT diam:     1.90 cm     LV E/e' lateral: 7.7 LV SV:         76 LV SV Index:   38 LVOT Area:     2.84 cm  LV Volumes (MOD) LV vol d, MOD A4C: 84.8 ml LV vol s, MOD A4C: 45.6 ml LV SV MOD A4C:     84.8 ml RIGHT VENTRICLE             IVC RV S prime:     15.20 cm/s  IVC diam: 1.70 cm TAPSE (M-mode): 2.1 cm LEFT ATRIUM             Index       RIGHT ATRIUM           Index LA diam:        3.70 cm 1.87 cm/m  RA Area:     13.40 cm LA Vol (A2C):   50.7 ml 25.63 ml/m RA Volume:   32.30 ml  16.33 ml/m LA Vol (A4C):   54.3 ml 27.45 ml/m LA Biplane Vol: 55.8 ml 28.20 ml/m  AORTIC VALVE LVOT Vmax:   117.00 cm/s LVOT Vmean:  74.500 cm/s LVOT VTI:    0.267 m  AORTA Ao Root diam: 3.10 cm Ao Asc diam:  2.80 cm MITRAL VALVE MV Area (PHT): 2.62 cm    SHUNTS MV Decel Time:  289 msec    Systemic VTI:  0.27 m MV E velocity: 50.10 cm/s  Systemic Diam: 1.90 cm MV A velocity: 69.70 cm/s MV E/A ratio:  0.72 Mihai Croitoru MD Electronically signed by Thurmon Fair MD Signature Date/Time: 04/04/2021/11:29:19 AM    Final    CT HEAD CODE STROKE WO CONTRAST  Result Date: 04/04/2021 CLINICAL DATA:  Code stroke.  Ataxia and dysarthria. EXAM: CT HEAD WITHOUT CONTRAST CT ANGIOGRAPHY OF THE HEAD AND NECK TECHNIQUE: Contiguous axial images were obtained from the base of the skull through the vertex without intravenous contrast. Multidetector CT imaging of the head and neck was performed using the standard protocol during bolus administration of intravenous contrast. Multiplanar CT image reconstructions and MIPs were obtained to evaluate the vascular anatomy. Carotid stenosis measurements (when applicable) are obtained utilizing NASCET criteria, using the distal internal carotid diameter as the denominator. CONTRAST:  107mL OMNIPAQUE IOHEXOL 350 MG/ML SOLN COMPARISON:  None.  FINDINGS: CT HEAD FINDINGS Brain: There is no mass, hemorrhage or extra-axial collection. The size and configuration of the ventricles and extra-axial CSF spaces are normal. The brain parenchyma is normal, without evidence of acute or chronic infarction. Vascular: No abnormal hyperdensity of the major intracranial arteries or dural venous sinuses. No intracranial atherosclerosis. Skull: The visualized skull base, calvarium and extracranial soft tissues are normal. Sinuses/Orbits: No fluid levels or advanced mucosal thickening of the visualized paranasal sinuses. No mastoid or middle ear effusion. The orbits are normal. ASPECTS (Alberta Stroke Program Early CT Score) - Ganglionic level infarction (caudate, lentiform nuclei, internal capsule, insula, M1-M3 cortex): 7 - Supraganglionic infarction (M4-M6 cortex): 3 Total score (0-10 with 10 being normal): 10 CTA NECK FINDINGS SKELETON: There is no bony spinal canal stenosis. No lytic  or blastic lesion. OTHER NECK: Normal pharynx, larynx and major salivary glands. No cervical lymphadenopathy. Unremarkable thyroid gland. UPPER CHEST: No pneumothorax or pleural effusion. No nodules or masses. AORTIC ARCH: There is no calcific atherosclerosis of the aortic arch. There is no aneurysm, dissection or hemodynamically significant stenosis of the visualized portion of the aorta. Conventional 3 vessel aortic branching pattern. The visualized proximal subclavian arteries are widely patent. RIGHT CAROTID SYSTEM: Normal without aneurysm, dissection or stenosis. LEFT CAROTID SYSTEM: Normal without aneurysm, dissection or stenosis. VERTEBRAL ARTERIES: Left dominant configuration. Both origins are clearly patent. There is no dissection, occlusion or flow-limiting stenosis to the skull base (V1-V3 segments). CTA HEAD FINDINGS POSTERIOR CIRCULATION: --Vertebral arteries: Normal V4 segments. --Inferior cerebellar arteries: Normal. --Basilar artery: Normal. --Superior cerebellar arteries: Normal. --Posterior cerebral arteries (PCA): Mild distal left PCA narrowing. Normal right. ANTERIOR CIRCULATION: --Intracranial internal carotid arteries: Normal. --Anterior cerebral arteries (ACA): Normal. Both A1 segments are present. Patent anterior communicating artery (a-comm). --Middle cerebral arteries (MCA): Normal. VENOUS SINUSES: As permitted by contrast timing, patent. ANATOMIC VARIANTS: Hypoplastic left ACA A1 segment, a common variant. Review of the MIP images confirms the above findings. IMPRESSION: 1. No intracranial hemorrhage.  ASPECTS is 10. 2. No emergent large vessel occlusion or hemodynamically significant stenosis by NASCET criteria. 3. Normal carotid and vertebral arteries. These results were called by telephone at the time of interpretation on 04/04/2021 at 1:14 am to provider ERIC [facility] , who verbally acknowledged these results. Electronically Signed   By: Deatra Robinson M.D.   On: 04/04/2021 01:18   CT  ANGIO HEAD CODE STROKE  Result Date: 04/04/2021 CLINICAL DATA:  Code stroke.  Ataxia and dysarthria. EXAM: CT HEAD WITHOUT CONTRAST CT ANGIOGRAPHY OF THE HEAD AND NECK TECHNIQUE: Contiguous axial images were obtained from the base of the skull through the vertex without intravenous contrast. Multidetector CT imaging of the head and neck was performed using the standard protocol during bolus administration of intravenous contrast. Multiplanar CT image reconstructions and MIPs were obtained to evaluate the vascular anatomy. Carotid stenosis measurements (when applicable) are obtained utilizing NASCET criteria, using the distal internal carotid diameter as the denominator. CONTRAST:  75mL OMNIPAQUE IOHEXOL 350 MG/ML SOLN COMPARISON:  None. FINDINGS: CT HEAD FINDINGS Brain: There is no mass, hemorrhage or extra-axial collection. The size and configuration of the ventricles and extra-axial CSF spaces are normal. The brain parenchyma is normal, without evidence of acute or chronic infarction. Vascular: No abnormal hyperdensity of the major intracranial arteries or dural venous sinuses. No intracranial atherosclerosis. Skull: The visualized skull base, calvarium and extracranial soft tissues are normal. Sinuses/Orbits: No fluid levels or advanced mucosal thickening of the visualized paranasal sinuses. No mastoid or middle  ear effusion. The orbits are normal. ASPECTS (Alberta Stroke Program Early CT Score) - Ganglionic level infarction (caudate, lentiform nuclei, internal capsule, insula, M1-M3 cortex): 7 - Supraganglionic infarction (M4-M6 cortex): 3 Total score (0-10 with 10 being normal): 10 CTA NECK FINDINGS SKELETON: There is no bony spinal canal stenosis. No lytic or blastic lesion. OTHER NECK: Normal pharynx, larynx and major salivary glands. No cervical lymphadenopathy. Unremarkable thyroid gland. UPPER CHEST: No pneumothorax or pleural effusion. No nodules or masses. AORTIC ARCH: There is no calcific  atherosclerosis of the aortic arch. There is no aneurysm, dissection or hemodynamically significant stenosis of the visualized portion of the aorta. Conventional 3 vessel aortic branching pattern. The visualized proximal subclavian arteries are widely patent. RIGHT CAROTID SYSTEM: Normal without aneurysm, dissection or stenosis. LEFT CAROTID SYSTEM: Normal without aneurysm, dissection or stenosis. VERTEBRAL ARTERIES: Left dominant configuration. Both origins are clearly patent. There is no dissection, occlusion or flow-limiting stenosis to the skull base (V1-V3 segments). CTA HEAD FINDINGS POSTERIOR CIRCULATION: --Vertebral arteries: Normal V4 segments. --Inferior cerebellar arteries: Normal. --Basilar artery: Normal. --Superior cerebellar arteries: Normal. --Posterior cerebral arteries (PCA): Mild distal left PCA narrowing. Normal right. ANTERIOR CIRCULATION: --Intracranial internal carotid arteries: Normal. --Anterior cerebral arteries (ACA): Normal. Both A1 segments are present. Patent anterior communicating artery (a-comm). --Middle cerebral arteries (MCA): Normal. VENOUS SINUSES: As permitted by contrast timing, patent. ANATOMIC VARIANTS: Hypoplastic left ACA A1 segment, a common variant. Review of the MIP images confirms the above findings. IMPRESSION: 1. No intracranial hemorrhage.  ASPECTS is 10. 2. No emergent large vessel occlusion or hemodynamically significant stenosis by NASCET criteria. 3. Normal carotid and vertebral arteries. These results were called by telephone at the time of interpretation on 04/04/2021 at 1:14 am to [facility] , who verbally acknowledged these results. Electronically Signed   By: Deatra RobinsonKevin  Herman M.D.   On: 04/04/2021 01:18   CT ANGIO NECK CODE STROKE  Result Date: 04/04/2021 CLINICAL DATA:  Code stroke.  Ataxia and dysarthria. EXAM: CT HEAD WITHOUT CONTRAST CT ANGIOGRAPHY OF THE HEAD AND NECK TECHNIQUE: Contiguous axial images were obtained from the base of the skull  through the vertex without intravenous contrast. Multidetector CT imaging of the head and neck was performed using the standard protocol during bolus administration of intravenous contrast. Multiplanar CT image reconstructions and MIPs were obtained to evaluate the vascular anatomy. Carotid stenosis measurements (when applicable) are obtained utilizing NASCET criteria, using the distal internal carotid diameter as the denominator. CONTRAST:  75mL OMNIPAQUE IOHEXOL 350 MG/ML SOLN COMPARISON:  None. FINDINGS: CT HEAD FINDINGS Brain: There is no mass, hemorrhage or extra-axial collection. The size and configuration of the ventricles and extra-axial CSF spaces are normal. The brain parenchyma is normal, without evidence of acute or chronic infarction. Vascular: No abnormal hyperdensity of the major intracranial arteries or dural venous sinuses. No intracranial atherosclerosis. Skull: The visualized skull base, calvarium and extracranial soft tissues are normal. Sinuses/Orbits: No fluid levels or advanced mucosal thickening of the visualized paranasal sinuses. No mastoid or middle ear effusion. The orbits are normal. ASPECTS (Alberta Stroke Program Early CT Score) - Ganglionic level infarction (caudate, lentiform nuclei, internal capsule, insula, M1-M3 cortex): 7 - Supraganglionic infarction (M4-M6 cortex): 3 Total score (0-10 with 10 being normal): 10 CTA NECK FINDINGS SKELETON: There is no bony spinal canal stenosis. No lytic or blastic lesion. OTHER NECK: Normal pharynx, larynx and major salivary glands. No cervical lymphadenopathy. Unremarkable thyroid gland. UPPER CHEST: No pneumothorax or pleural effusion. No nodules or masses. AORTIC  ARCH: There is no calcific atherosclerosis of the aortic arch. There is no aneurysm, dissection or hemodynamically significant stenosis of the visualized portion of the aorta. Conventional 3 vessel aortic branching pattern. The visualized proximal subclavian arteries are widely  patent. RIGHT CAROTID SYSTEM: Normal without aneurysm, dissection or stenosis. LEFT CAROTID SYSTEM: Normal without aneurysm, dissection or stenosis. VERTEBRAL ARTERIES: Left dominant configuration. Both origins are clearly patent. There is no dissection, occlusion or flow-limiting stenosis to the skull base (V1-V3 segments). CTA HEAD FINDINGS POSTERIOR CIRCULATION: --Vertebral arteries: Normal V4 segments. --Inferior cerebellar arteries: Normal. --Basilar artery: Normal. --Superior cerebellar arteries: Normal. --Posterior cerebral arteries (PCA): Mild distal left PCA narrowing. Normal right. ANTERIOR CIRCULATION: --Intracranial internal carotid arteries: Normal. --Anterior cerebral arteries (ACA): Normal. Both A1 segments are present. Patent anterior communicating artery (a-comm). --Middle cerebral arteries (MCA): Normal. VENOUS SINUSES: As permitted by contrast timing, patent. ANATOMIC VARIANTS: Hypoplastic left ACA A1 segment, a common variant. Review of the MIP images confirms the above findings. IMPRESSION: 1. No intracranial hemorrhage.  ASPECTS is 10. 2. No emergent large vessel occlusion or hemodynamically significant stenosis by NASCET criteria. 3. Normal carotid and vertebral arteries. These results were called by telephone at the time of interpretation on 04/04/2021 at 1:14 am to provider ERIC [facility] , who verbally acknowledged these results. Electronically Signed   By: Deatra Robinson M.D.   On: 04/04/2021 01:18        Scheduled Meds: . aspirin EC  81 mg Oral QPM  . atorvastatin  80 mg Oral Daily  . brimonidine  1 drop Both Eyes TID  . clopidogrel  75 mg Oral Daily  . dorzolamide-timolol  1 drop Both Eyes BID  . enoxaparin (LOVENOX) injection  40 mg Subcutaneous Q24H  . latanoprost  1 drop Both Eyes QHS  . multivitamin with minerals  1 tablet Oral Daily  . pantoprazole  40 mg Oral Daily  . ranolazine  1,000 mg Oral Daily  . tamsulosin  0.8 mg Oral QPM   Continuous Infusions: . lactated  ringers 100 mL/hr at 04/05/21 0548     LOS: 1 day       Kathlen Mody, MD [facility]   To contact the attending provider between 7A-7P or the covering provider during after hours 7P-7A, please log into the web site [email or web address] and access using universal [place] password for that web site. If you do not have the password, please call the hospital operator.  04/05/2021, 12:11 PM

## 2021-04-05 NOTE — PMR Pre-admission (Signed)
PMR Admission Coordinator Pre-Admission Assessment  Patient: Bryan Crane is an 75 y.o., male MRN: [id number] DOB: 1946/06/18 Height: _0  (167.6 cm) Weight: 88.5 kg  Insurance Information HMO:     PPO:      PCP:      IPA:      80/20: yes     OTHER:  PRIMARY: Medicare A & B      Policy#: [id number]      Subscriber: patient CM Name:       Phone#:      Fax#:  Pre-Cert#:       Employer:  Benefits:  Phone #: verified eligibility via [place] on 04/05/21     Name:  Eff. Date: Part A effective: 07/02/10, Part B effective: 07/02/13     Deduct: $1,556      Out of Pocket Max: NA      Life Max: NA CIR: 100% with Medicare approval      SNF: 100% days 1-20, 80% days 21-100 Outpatient: 80%     Co-Pay: 20% Home Health: 100%      Co-Pay:  DME: 80%     Co-Pay: 20% Providers: pt's choice SECONDARY: Engineer, production Supplement       Policy#: [id number]     Phone#:   Financial Counselor:       Phone#:   The "Data Collection Information Summary" for patients in Inpatient Rehabilitation Facilities with attached "Privacy Act [place] Records" was provided and verbally reviewed with: Patient and Family  Emergency Contact Information Contact Information    Name Relation Home Work Mobile   [place] Spouse [phone]  [phone]      Current Medical History  Patient Admitting Diagnosis: Cerebellum CVA  History of Present Illness: Pt is a 75 year old male with medical hx significant for: OSA on CPAP, cardiomyopathy, CKD III, HTN, GERD, obesity. Pt presented to hospital d/t acute onset of dizziness, slurred speech, balance issues, and headache. Pt declined tPA.  CT was negative and CTA showed no LVO.  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 Echo revealed 60% EF.   Neurology recommeded Loop placement prior to d/c.  Therapy evaluations completed and CIR recommended d/t pt's deficits in functional mobility, stability, coordination, and strength.  Complete NIHSS TOTAL: 0  Patient's  medical record from [facility] has been reviewed by the rehabilitation admission coordinator and physician.  Past Medical History  Past Medical History:  Diagnosis Date  . Arthritis   . Cardiomyopathy ([place])   . CKD (chronic kidney disease)   . Dyspnea    with exertion  . Enlarged prostate   . GERD (gastroesophageal reflux disease)   . History of kidney stones   . Hypertension   . Sleep apnea    uses cpap    Family History   family history is not on file.  Prior Rehab/Hospitalizations Has the patient had prior rehab or hospitalizations prior to admission? No  Has the patient had major surgery during 100 days prior to admission? No   Current Medications  Current Facility-Administered Medications:  .  acetaminophen (TYLENOL) tablet 650 mg, 650 mg, Oral, Q4H PRN **OR** acetaminophen (TYLENOL) 160 MG/5ML solution 650 mg, 650 mg, Per Tube, Q4H PRN **OR** acetaminophen (TYLENOL) suppository 650 mg, 650 mg, Rectal, Q4H PRN, Chotiner, Yevonne Aline, MD .  aspirin EC tablet 81 mg, 81 mg, Oral, QPM, Chotiner, Yevonne Aline, MD, 81 mg at 04/05/21 1704 .  atorvastatin (LIPITOR) tablet 40 mg, 40 mg, Oral, Daily, Metzger-Cihelka, Desiree,  NP, 40 mg at 04/06/21 0937 .  brimonidine (ALPHAGAN) 0.2 % ophthalmic solution 1 drop, 1 drop, Both Eyes, TID, Cyndia Skeeters, Taye T, MD, 1 drop at 04/06/21 0942 .  clopidogrel (PLAVIX) tablet 75 mg, 75 mg, Oral, Daily, Chotiner, Yevonne Aline, MD, 75 mg at 04/06/21 0936 .  dorzolamide-timolol (COSOPT) 22.3-6.8 MG/ML ophthalmic solution 1 drop, 1 drop, Both Eyes, BID, Cyndia Skeeters, Taye T, MD, 1 drop at 04/06/21 0942 .  enoxaparin (LOVENOX) injection 40 mg, 40 mg, Subcutaneous, Q24H, Gonfa, Taye T, MD, 40 mg at 04/05/21 1657 .  lactated ringers infusion, , Intravenous, Continuous, Chotiner, Yevonne Aline, MD, Last Rate: 100 mL/hr at 04/05/21 0548, New Bag at 04/05/21 0548 .  latanoprost (XALATAN) 0.005 % ophthalmic solution 1 drop, 1 drop, Both Eyes, QHS, Chotiner, Yevonne Aline, MD, 1  drop at 04/05/21 2148 .  multivitamin with minerals tablet 1 tablet, 1 tablet, Oral, Daily, Wendee Beavers T, MD, 1 tablet at 04/06/21 0937 .  pantoprazole (PROTONIX) EC tablet 40 mg, 40 mg, Oral, Daily, Cyndia Skeeters, Taye T, MD, 40 mg at 04/06/21 0936 .  ranolazine (RANEXA) 12 hr tablet 1,000 mg, 1,000 mg, Oral, Daily, Chotiner, Yevonne Aline, MD, 1,000 mg at 04/06/21 0937 .  senna-docusate (Senokot-S) tablet 1 tablet, 1 tablet, Oral, QHS PRN, Chotiner, Yevonne Aline, MD .  sodium chloride (OCEAN) 0.65 % nasal spray 1 spray, 1 spray, Each Nare, PRN, Cyndia Skeeters, Taye T, MD .  tamsulosin (FLOMAX) capsule 0.8 mg, 0.8 mg, Oral, QPM, Chotiner, Yevonne Aline, MD, 0.8 mg at 04/05/21 1656  Patients Current Diet:  Diet Order            Diet - low sodium heart healthy           Diet Heart Room service appropriate? Yes; Fluid consistency: Thin  Diet effective now                 Precautions / Restrictions Precautions Precautions: Fall Restrictions Weight Bearing Restrictions: No   Has the patient had 2 or more falls or a fall with injury in the past year? No  Prior Activity Level Limited Community (1-2x/wk): drives, out of house 3-4x/week since COVID pandemic  Prior Functional Level Self Care: Did the patient need help bathing, dressing, using the toilet or eating? Independent  Indoor Mobility: Did the patient need assistance with walking from room to room (with or without device)? Independent  Stairs: Did the patient need assistance with internal or external stairs (with or without device)? Independent  Functional Cognition: Did the patient need help planning regular tasks such as shopping or remembering to take medications? Independent  Home Assistive Devices / Equipment Home Equipment: Cane - single point  Prior Device Use: Indicate devices/aids used by the patient prior to current illness, exacerbation or injury? None of the above  Current Functional Level Cognition  Arousal/Alertness:  Awake/alert Overall Cognitive Status: Within Functional Limits for tasks assessed Orientation Level: Oriented X4 Safety/Judgement: Decreased awareness of safety,Decreased awareness of deficits General Comments: Pt A/O x4 with cues. Pt following all commands and aware that he is moving better than a few days ago. Pt scored a perfect score on Short Blessed Test revealing no memory deficit and able to perform task while ambulating in a distracting environment. Attention: Focused,Alternating Focused Attention: Impaired Focused Attention Impairment: Verbal complex,Functional complex Alternating Attention: Impaired Alternating Attention Impairment: Verbal complex,Functional complex Memory: Impaired Memory Impairment: Decreased recall of new information Problem Solving: Impaired Problem Solving Impairment: Verbal complex,Functional complex Executive Function: [place] Monitoring Organizing:  Impaired Organizing Impairment: Verbal complex,Functional complex Self Monitoring: Impaired Self Monitoring Impairment: Verbal complex,Functional complex Safety/Judgment: Impaired Comments: per PT notes    Extremity Assessment (includes Sensation/Coordination)  Upper Extremity Assessment: Overall WFL for tasks assessed,RUE deficits/detail,LUE deficits/detail RUE Deficits / Details: strength 4/5 RUE Sensation: WNL RUE Coordination: WNL LUE Deficits / Details: strength 4/5 LUE Sensation: WNL LUE Coordination: WNL  Lower Extremity Assessment: Overall WFL for tasks assessed,Defer to PT evaluation    ADLs  Overall ADL's : Needs assistance/impaired Eating/Feeding: Set up,Sitting Grooming: Min guard,Standing Upper Body Bathing: Min guard,Standing Lower Body Bathing: Minimal assistance,Sitting/lateral leans,Sit to/from stand Upper Body Dressing : Min guard,Standing Lower Body Dressing: Minimal assistance,Sitting/lateral leans,Sit to/from stand,Cueing for safety Toilet Transfer: Minimal  assistance,Cueing for safety,Cueing for sequencing,Ambulation,Regular Toilet,RW,Grab bars Toilet Transfer Details (indicate cue type and reason): cues for hand placement Toileting- Clothing Manipulation and Hygiene: Minimal assistance,Cueing for safety,Sitting/lateral lean,Sit to/from stand Toileting - Clothing Manipulation Details (indicate cue type and reason): LOB x1 in standing Functional mobility during ADLs: Minimal assistance,Rolling walker,Cueing for safety,Cueing for sequencing General ADL Comments: Pt requiring cues for mobility with RW and safety. Pt performing bed mobility with supervisionA and mobility iwth minguardA to minA overall with RW.  Pt stepping around bed to recliner with no AD, but use of furniture for stability. Pt appears strong and with minimal balance deficits, but could benefit from higher level ADL and challenging balance tasks.    Mobility  Overal bed mobility: Needs Assistance Bed Mobility: Supine to Sit Supine to sit: Supervision Sit to supine: Min assist General bed mobility comments: minA to transition to sitting EOB with min-modA to steady once in sitting position. pt with posterior lean, needing cues to maintain trunk forward and feet on floor    Transfers  Overall transfer level: Needs assistance Equipment used: Rolling walker (2 wheeled),1 person hand held assist Transfers: Sit to/from [place] to Stand: Min guard Stand pivot transfers: Min assist General transfer comment: minA for stability with LOB x1 at commode    Ambulation / Gait / Stairs / Wheelchair Mobility  Ambulation/Gait Ambulation/Gait assistance: Min assist,Mod assist Gait Distance (Feet): 45 Feet Assistive device: Rolling walker (2 wheeled) Gait Pattern/deviations: Step-through pattern,Decreased stride length,Decreased weight shift to right,Decreased stance time - right,Ataxic,Staggering left General Gait Details: pt with multiple LOB to L requiring modA to steady  and recover. difficulty maintaining RW on ground at times without assist. inconsistent step lengths but generally short with decerased wt shift to RLE Gait velocity: decreased Gait velocity interpretation: <1.31 ft/sec, indicative of household ambulator    Posture / Balance Dynamic Sitting Balance Sitting balance - Comments: reliant on UE support and support from therapist Static Standing Balance Tandem Stance - Right Leg: 0 Tandem Stance - Left Leg: 2 [place] - Eyes Opened: 15 (mild lateral and A-P sway) [place] - Eyes Closed: 8 (significant lateral and A-P sway) Balance Overall balance assessment: Needs assistance Sitting-balance support: Single extremity supported,Feet supported Sitting balance-Leahy Scale: Good Sitting balance - Comments: reliant on UE support and support from therapist Postural control: Posterior lean,Right lateral lean,Left lateral lean Standing balance support: Bilateral upper extremity supported Standing balance-Leahy Scale: Poor Standing balance comment: reliant on BUE support and minguardA Tandem Stance - Right Leg: 0 Tandem Stance - Left Leg: 2 [place] - Eyes Opened: 15 (mild lateral and A-P sway) [place] - Eyes Closed: 8 (significant lateral and A-P sway)    Special needs/care consideration Continuous Drip IV  lactated ringers infusion: 135m/hr and Designated visitor [place]  Oldaker, wife   Previous Environmental health practitioner (from acute therapy documentation) Living Arrangements: Spouse/significant other  Lives With: Spouse Available Help at Discharge: Family,Available 24 hours/day Type of Home: House Home Layout: One level Home Access: Stairs to enter Entrance Stairs-Rails: [place] reach both Entrance Stairs-Number of Steps: 3 in front. No steps in back Bathroom Shower/Tub: Chiropodist: Handicapped height Bathroom Accessibility: Yes How Accessible: Accessible via walker [place]: No  Discharge Living  Setting Plans for Discharge Living Setting: Patient's home Type of Home at Discharge: House Discharge Home Layout: One level Discharge Home Access: Stairs to enter Entrance Stairs-Rails: [place] reach both Entrance Stairs-Number of Steps: 3 steps in front. No steps in back Discharge Bathroom Shower/Tub: Tub/shower unit Discharge Bathroom Toilet: Handicapped height Discharge Bathroom Accessibility: Yes How Accessible: Accessible via walker Does the patient have any problems obtaining your medications?: No  Social/Family/Support Systems Anticipated Caregiver: Luisfernando Brightwell, wife Anticipated Caregiver's Contact Information: [phone] Caregiver Availability: 24/7 Discharge Plan Discussed with Primary Caregiver: Yes Is Caregiver In Agreement with Plan?: Yes Does Caregiver/Family have Issues with Lodging/Transportation while Pt is in Rehab?: No  Goals Patient/Family Goal for Rehab: Mod I-Supervision: PT/OT; Mod I-I: ST Expected length of stay: 7-10 days Pt/Family Agrees to Admission and willing to participate: Yes Program Orientation Provided & Reviewed with Pt/Caregiver Including Roles  & Responsibilities: Yes  Decrease burden of Care through IP rehab admission: NA  Possible need for SNF placement upon discharge: Not anticipated  Patient Condition: I have reviewed medical records from [facility], spoken with CSW, and patient and spouse. I met with patient at the bedside for inpatient rehabilitation assessment.  Patient will benefit from ongoing PT, OT and SLP, can actively participate in 3 hours of therapy a day 5 days of the week, and can make measurable gains during the admission.  Patient will also benefit from the coordinated team approach during an Inpatient Acute Rehabilitation admission.  The patient will receive intensive therapy as well as Rehabilitation physician, nursing, social worker, and care management interventions.  Due to safety, disease management,  medication administration and patient education the patient requires 24 hour a day rehabilitation nursing.  The patient is currently Min A-Mod A with mobility and Min G-Min A with basic ADLs.  Discharge setting and therapy post discharge at home with home health is anticipated.  Patient has agreed to participate in the Acute Inpatient Rehabilitation Program and will admit today.  Preadmission Screen Completed By:  Bethel Born, 04/06/2021 10:49 AM ______________________________________________________________________   Discussed status with Dr. Naaman Plummer on 04/06/21  at 10:49 AM and received approval for admission today.  Admission Coordinator:  Bethel Born, CCC-SLP, time 10:49 AM/Date 04/06/21    Assessment/Plan: Diagnosis: right cerebellar CVA 1. Does the need for close, 24 hr/day Medical supervision in concert with the patient's rehab needs make it unreasonable for this patient to be served in a less intensive setting? Yes 2. Co-Morbidities requiring supervision/potential complications: CM, HTN, CKD, OSA 3. Due to bladder management, bowel management, safety, skin/wound care, disease management, medication administration, pain management and patient education, does the patient require 24 hr/day rehab nursing? Yes 4. Does the patient require coordinated care of a physician, rehab nurse, PT, OT, and SLP to address physical and functional deficits in the context of the above medical diagnosis(es)? Yes Addressing deficits in the following areas: balance, endurance, locomotion, strength, transferring, bowel/bladder control, bathing, dressing, feeding, grooming, toileting, speech and psychosocial support 5. Can the patient actively participate  in an intensive therapy program of at least 3 hrs of therapy 5 days a week? Yes 6. The potential for patient to make measurable gains while on inpatient rehab is excellent 7. Anticipated functional outcomes upon discharge from inpatient rehab:  modified independent and supervision PT, modified independent and supervision OT, modified independent SLP 8. Estimated rehab length of stay to reach the above functional goals is: 7-10 days 9. Anticipated discharge destination: Home 10. Overall Rehab/Functional Prognosis: excellent   MD Signature: Meredith Staggers, MD, [facility] 04/06/2021

## 2021-04-05 NOTE — Progress Notes (Signed)
Pt places self on/off home cpap as needed.  RT will monitor as needed.

## 2021-04-06 ENCOUNTER — Other Ambulatory Visit: Payer: Self-pay

## 2021-04-06 ENCOUNTER — Encounter (HOSPITAL_COMMUNITY): Payer: Self-pay | Admitting: Internal Medicine

## 2021-04-06 ENCOUNTER — Inpatient Hospital Stay (HOSPITAL_COMMUNITY)
Admission: EM | Disposition: A | Discharge status: Rehabilitation Facility | Payer: Self-pay | Source: Home / Self Care | Attending: Internal Medicine

## 2021-04-06 ENCOUNTER — Inpatient Hospital Stay (HOSPITAL_COMMUNITY)
Admission: RE | Admit: 2021-04-06 | Discharge: 2021-04-10 | DRG: 057 | Disposition: A | Discharge status: Home / Self Care | Payer: Medicare Other | Source: Intra-hospital | Attending: Physical Medicine and Rehabilitation | Admitting: Physical Medicine and Rehabilitation

## 2021-04-06 DIAGNOSIS — I69328 Other speech and language deficits following cerebral infarction: Secondary | ICD-10-CM | POA: Diagnosis not present

## 2021-04-06 DIAGNOSIS — N1832 Chronic kidney disease, stage 3b: Secondary | ICD-10-CM

## 2021-04-06 DIAGNOSIS — G4733 Obstructive sleep apnea (adult) (pediatric): Secondary | ICD-10-CM

## 2021-04-06 DIAGNOSIS — Z8673 Personal history of transient ischemic attack (TIA), and cerebral infarction without residual deficits: Secondary | ICD-10-CM

## 2021-04-06 DIAGNOSIS — I129 Hypertensive chronic kidney disease with stage 1 through stage 4 chronic kidney disease, or unspecified chronic kidney disease: Secondary | ICD-10-CM | POA: Diagnosis present

## 2021-04-06 DIAGNOSIS — N183 Chronic kidney disease, stage 3 unspecified: Secondary | ICD-10-CM | POA: Diagnosis present

## 2021-04-06 DIAGNOSIS — K219 Gastro-esophageal reflux disease without esophagitis: Secondary | ICD-10-CM | POA: Diagnosis present

## 2021-04-06 DIAGNOSIS — R3911 Hesitancy of micturition: Secondary | ICD-10-CM

## 2021-04-06 DIAGNOSIS — G4730 Sleep apnea, unspecified: Secondary | ICD-10-CM | POA: Diagnosis present

## 2021-04-06 DIAGNOSIS — E785 Hyperlipidemia, unspecified: Secondary | ICD-10-CM | POA: Diagnosis present

## 2021-04-06 DIAGNOSIS — Z7982 Long term (current) use of aspirin: Secondary | ICD-10-CM | POA: Diagnosis not present

## 2021-04-06 DIAGNOSIS — N400 Enlarged prostate without lower urinary tract symptoms: Secondary | ICD-10-CM | POA: Diagnosis present

## 2021-04-06 DIAGNOSIS — Z7902 Long term (current) use of antithrombotics/antiplatelets: Secondary | ICD-10-CM | POA: Diagnosis not present

## 2021-04-06 DIAGNOSIS — I429 Cardiomyopathy, unspecified: Secondary | ICD-10-CM | POA: Diagnosis present

## 2021-04-06 DIAGNOSIS — Z79899 Other long term (current) drug therapy: Secondary | ICD-10-CM

## 2021-04-06 DIAGNOSIS — N401 Enlarged prostate with lower urinary tract symptoms: Secondary | ICD-10-CM

## 2021-04-06 DIAGNOSIS — I69322 Dysarthria following cerebral infarction: Principal | ICD-10-CM

## 2021-04-06 HISTORY — PX: LOOP RECORDER INSERTION: EP1214

## 2021-04-06 LAB — CBC
HCT: 42.8 % (ref 39.0–52.0)
Hemoglobin: 15 g/dL (ref 13.0–17.0)
MCH: 31.8 pg (ref 26.0–34.0)
MCHC: 35 g/dL (ref 30.0–36.0)
MCV: 90.9 fL (ref 80.0–100.0)
Platelets: 229 10*3/uL (ref 150–400)
RBC: 4.71 MIL/uL (ref 4.22–5.81)
RDW: 12.5 % (ref 11.5–15.5)
WBC: 8 10*3/uL (ref 4.0–10.5)
nRBC: 0 % (ref 0.0–0.2)

## 2021-04-06 LAB — HEMOGLOBIN A1C
Hgb A1c MFr Bld: 5.8 % — ABNORMAL HIGH (ref 4.8–5.6)
Mean Plasma Glucose: 120 mg/dL

## 2021-04-06 LAB — CREATININE, SERUM
Creatinine, Ser: 1.84 mg/dL — ABNORMAL HIGH (ref 0.61–1.24)
GFR, Estimated: 38 mL/min — ABNORMAL LOW (ref >60)

## 2021-04-06 SURGERY — LOOP RECORDER INSERTION

## 2021-04-06 MED ORDER — TAMSULOSIN HCL 0.4 MG PO CAPS
0.8000 mg | ORAL_CAPSULE | Freq: Every evening | ORAL | Status: DC
  Administered 2021-04-07 – 2021-04-09 (×3): 0.8 mg via ORAL
  Filled 2021-04-06 (×3): qty 2

## 2021-04-06 MED ORDER — ENOXAPARIN SODIUM 40 MG/0.4ML IJ SOSY: 40.0000 mg | PREFILLED_SYRINGE | INTRAMUSCULAR | Status: DC

## 2021-04-06 MED ORDER — CLOPIDOGREL BISULFATE 75 MG PO TABS
75.0000 mg | ORAL_TABLET | Freq: Every day | ORAL | Status: DC
  Administered 2021-04-07 – 2021-04-10 (×4): 75 mg via ORAL
  Filled 2021-04-06 (×4): qty 1

## 2021-04-06 MED ORDER — ATORVASTATIN CALCIUM 40 MG PO TABS
40.0000 mg | ORAL_TABLET | Freq: Every day | ORAL | Status: DC
  Administered 2021-04-07 – 2021-04-10 (×4): 40 mg via ORAL
  Filled 2021-04-06 (×4): qty 1

## 2021-04-06 MED ORDER — ACETAMINOPHEN 160 MG/5ML PO SOLN: 650.0000 mg | ORAL | Status: DC | PRN

## 2021-04-06 MED ORDER — DORZOLAMIDE HCL-TIMOLOL MAL 2-0.5 % OP SOLN
1.0000 [drp] | Freq: Two times a day (BID) | OPHTHALMIC | Status: DC
  Administered 2021-04-06 – 2021-04-10 (×8): 1 [drp] via OPHTHALMIC
  Filled 2021-04-06: qty 10

## 2021-04-06 MED ORDER — SENNOSIDES-DOCUSATE SODIUM 8.6-50 MG PO TABS: 1.0000 | ORAL_TABLET | Freq: Every evening | ORAL | Status: DC | PRN

## 2021-04-06 MED ORDER — ATORVASTATIN CALCIUM 40 MG PO TABS: 40.0000 mg | ORAL_TABLET | Freq: Every day | ORAL | 3 refills | Status: DC

## 2021-04-06 MED ORDER — BRIMONIDINE TARTRATE 0.2 % OP SOLN
1.0000 [drp] | Freq: Three times a day (TID) | OPHTHALMIC | Status: DC
  Administered 2021-04-06 – 2021-04-10 (×11): 1 [drp] via OPHTHALMIC
  Filled 2021-04-06: qty 5

## 2021-04-06 MED ORDER — SALINE SPRAY 0.65 % NA SOLN
1.0000 | NASAL | Status: DC | PRN
  Filled 2021-04-06: qty 44

## 2021-04-06 MED ORDER — CLOPIDOGREL BISULFATE 75 MG PO TABS: 75.0000 mg | ORAL_TABLET | Freq: Every day | ORAL | 1 refills | Status: DC

## 2021-04-06 MED ORDER — ADULT MULTIVITAMIN W/MINERALS CH
1.0000 | ORAL_TABLET | Freq: Every day | ORAL | Status: DC
  Administered 2021-04-07 – 2021-04-10 (×4): 1 via ORAL
  Filled 2021-04-06 (×4): qty 1

## 2021-04-06 MED ORDER — ACETAMINOPHEN 325 MG PO TABS: 650.0000 mg | ORAL_TABLET | ORAL | Status: DC | PRN

## 2021-04-06 MED ORDER — ACETAMINOPHEN 650 MG RE SUPP: 650.0000 mg | RECTAL | Status: DC | PRN

## 2021-04-06 MED ORDER — RANOLAZINE ER 500 MG PO TB12
1000.0000 mg | ORAL_TABLET | Freq: Every day | ORAL | Status: DC
  Administered 2021-04-07 – 2021-04-10 (×4): 1000 mg via ORAL
  Filled 2021-04-06 (×4): qty 2

## 2021-04-06 MED ORDER — LIDOCAINE-EPINEPHRINE 1 %-1:100000 IJ SOLN
INTRAMUSCULAR | Status: AC
  Filled 2021-04-06: qty 1

## 2021-04-06 MED ORDER — PANTOPRAZOLE SODIUM 40 MG PO TBEC
40.0000 mg | DELAYED_RELEASE_TABLET | Freq: Every day | ORAL | Status: DC
  Administered 2021-04-07 – 2021-04-10 (×4): 40 mg via ORAL
  Filled 2021-04-06 (×4): qty 1

## 2021-04-06 MED ORDER — LATANOPROST 0.005 % OP SOLN
1.0000 [drp] | Freq: Every day | OPHTHALMIC | Status: DC
  Administered 2021-04-06 – 2021-04-09 (×4): 1 [drp] via OPHTHALMIC
  Filled 2021-04-06: qty 2.5

## 2021-04-06 MED ORDER — ASPIRIN 81 MG PO TBEC: 81.0000 mg | DELAYED_RELEASE_TABLET | Freq: Every evening | ORAL | 11 refills | Status: DC

## 2021-04-06 MED ORDER — ASPIRIN EC 81 MG PO TBEC
81.0000 mg | DELAYED_RELEASE_TABLET | Freq: Every evening | ORAL | Status: DC
  Administered 2021-04-06 – 2021-04-09 (×4): 81 mg via ORAL
  Filled 2021-04-06 (×4): qty 1

## 2021-04-06 SURGICAL SUPPLY — 2 items
MONITOR REVEAL LINQ II (Prosthesis & Implant Heart) ×1 IMPLANT
PACK LOOP INSERTION (CUSTOM PROCEDURE TRAY) ×2 IMPLANT

## 2021-04-06 NOTE — Progress Notes (Signed)
Placed patient on home CPAP for the night. Sp02=96%

## 2021-04-06 NOTE — Discharge Summary (Signed)
Physician Discharge Summary  Bryan Crane NWG:[id number] DOB: 1946/03/17 DOA: 04/03/2021  PCP: Majel Homer, MD  Admit date: 04/03/2021 Discharge date: 04/06/2021  Admitted From: Home Disposition:  CIR  Recommendations for Outpatient Follow-up:  1. Follow up with PCP in 1-2 weeks 2. Please obtain BMP/CBC in one week Please follow up with neurology as recommended.  Please follow up with EP for evaluation for arrhythmias Please follow up with SLP on discharge.   Discharge Condition:stable.  CODE STATUS: FULL CODE.  Diet recommendation: Heart Healthy   Brief/Interim Summary:  75 year old M with PMH of cardiomyopathy, OSA on CPAP, CKD-3A, obesity, HTNandGERD presenting with sudden onset of an STD gait, slurred speech, dizziness andheadache and found to have small right cerebellar CVA on MRI. Patient has not taken his blood pressure medication for few days. PCP changed his Avapro to Aceonrecently, andpharmacy did not have it to stop. CT head and CTA head and neck without significant finding. Neurology following. Pt seen and examined at bedside, no new complaints today.   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 Presented with unsteady gair and dysarthria.  Continue with aspirin and plavix.  Echocardiogram showed Left ventricular ejection fraction, by estimation, is 60 to 65%. The  left ventricle has normal function. The left ventricle has no regional  wall motion abnormalities. Left ventricular diastolic parameters were normal.  LDL is 114 , on statin. Continue on discharge. Hemoglobin A1c is pending. Follow up as outpatient.  Outpatient follow upw ith neurology recommended.  Therapy evaluations  recommending CIR.     [facility] hypertension Resume home meds on discharge.    Stave 3 b CKD: Creatinine is stable around 1.8.     Discharge Instructions  Discharge Instructions    Diet - low sodium heart healthy   Complete by: As directed    Discharge instructions   Complete by: As directed    Please follow up wit Neurology as recommended.     Allergies as of 04/06/2021   No Known Allergies     Medication List    TAKE these medications   acetaminophen 500 MG tablet Commonly known as: TYLENOL Take 500 mg by mouth 2 (two) times daily as needed for moderate pain or headache.   aspirin 81 MG EC tablet Take 1 tablet (81 mg total) by mouth every evening. Swallow whole. What changed: additional instructions   atorvastatin 40 MG tablet Commonly known as: LIPITOR Take 1 tablet (40 mg total) by mouth daily. Start taking on: April 07, 2021   brimonidine 0.2 % ophthalmic solution Commonly known as: ALPHAGAN Place 1 drop into both eyes in the morning and at bedtime.   clopidogrel 75 MG tablet Commonly known as: PLAVIX Take 1 tablet (75 mg total) by mouth daily. Start taking on: April 07, 2021   dorzolamide-timolol 22.3-6.8 MG/ML ophthalmic solution Commonly known as: COSOPT Place 1 drop into both eyes 2 (two) times daily.   lansoprazole 30 MG capsule Commonly known as: PREVACID Take 30 mg by mouth daily.   latanoprost 0.005 % ophthalmic solution Commonly known as: XALATAN Place 1 drop into both eyes at bedtime.   multivitamin with minerals Tabs tablet Take 1 tablet by mouth daily.   perindopril 8 MG tablet Commonly  known as: ACEON Take 8 mg by mouth daily.   PROBIOTIC DAILY PO Take 1 capsule by mouth daily.   Ranexa 1000 MG SR tablet Generic drug: ranolazine Take 1,000 mg by mouth at bedtime.   sodium chloride 0.65 % Soln nasal spray Commonly known as: OCEAN Place 1 spray into both nostrils as needed for congestion.   spironolactone 25 MG  tablet Commonly known as: ALDACTONE Take 25 mg by mouth every Monday, Wednesday, and Friday.   tamsulosin 0.4 MG Caps capsule Commonly known as: FLOMAX Take 0.8 mg by mouth every evening.       Follow-up Information    [facility]. Schedule an appointment as soon as possible for a visit in 4 week(s).   Specialty: Neurology Contact information: [address] [place] [address] [phone]             No Known Allergies  Consultations:  Neurology  EP   Procedures/Studies: MR BRAIN WO CONTRAST  Result Date: 04/04/2021 CLINICAL DATA:  Stroke follow-up EXAM: MRI HEAD WITHOUT CONTRAST TECHNIQUE: Multiplanar, multiecho pulse sequences of the brain and surrounding structures were obtained without intravenous contrast. COMPARISON:  CTA head neck 04/04/2021 FINDINGS: Brain: There is a small acute infarct of the anterior superior right cerebellum. No acute or chronic hemorrhage. Normal white matter signal, parenchymal volume and CSF spaces. The midline structures are normal. Vascular: Major flow voids are preserved. Skull and upper cervical spine: Normal calvarium and skull base. Visualized upper cervical spine and soft tissues are normal. Sinuses/Orbits:No paranasal sinus fluid levels or advanced mucosal thickening. No mastoid or middle ear effusion. Normal orbits. 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 No hemorrhage or mass effect. Electronically Signed   By: Deatra Robinson M.D.   On: 04/04/2021 02:32   ECHOCARDIOGRAM COMPLETE  Result Date: 04/04/2021    ECHOCARDIOGRAM REPORT   Patient Name:   Bryan Crane Date of Exam: 04/04/2021 Medical Rec #:  [id number]         Height:       66.0 in Accession #:    [id number]        Weight:       195.0 lb Date of Birth:  [birth year]         BSA:          1.978 m Patient Age:    75 years          BP:           167/78 mmHg Patient Gender: M                 HR:            55 bpm. Exam Location:  Inpatient Procedure: 2D Echo Indications:    stroke  History:        Patient has no prior history of Echocardiogram examinations.                 Cardiomyopathy, chronic kidney disease, Arrythmias:PVC; Risk                 Factors:Hypertension.  Sonographer:    Delcie Roch Referring Phys: [id number] BRADLEY S CHOTINER IMPRESSIONS  1. Left ventricular ejection fraction, by estimation, is 60 to 65%. The left ventricle has normal function. The left ventricle has no regional wall motion abnormalities. Left ventricular diastolic parameters were normal.  2. Right ventricular systolic function is normal. The right ventricular size is normal.  3. The mitral  valve is normal in structure. No evidence of mitral valve regurgitation. No evidence of mitral stenosis.  4. The aortic valve is normal in structure. Aortic valve regurgitation is not visualized. No aortic stenosis is present.  5. The inferior vena cava is normal in size with greater than 50% respiratory variability, suggesting right atrial pressure of 3 mmHg. FINDINGS  Left Ventricle: Left ventricular ejection fraction, by estimation, is 60 to 65%. The left ventricle has normal function. The left ventricle has no regional wall motion abnormalities. The left ventricular internal cavity size was normal in size. There is  no left ventricular hypertrophy. Left ventricular diastolic parameters were normal. Normal left ventricular filling pressure. Right Ventricle: The right ventricular size is normal. No increase in right ventricular wall thickness. Right ventricular systolic function is normal. Left Atrium: Left atrial size was normal in size. Right Atrium: Right atrial size was normal in size. Pericardium: There is no evidence of pericardial effusion. Mitral Valve: The mitral valve is normal in structure. No evidence of mitral valve regurgitation. No evidence of mitral valve stenosis. Tricuspid Valve: The tricuspid valve is normal in structure.  Tricuspid valve regurgitation is not demonstrated. No evidence of tricuspid stenosis. Aortic Valve: The aortic valve is normal in structure. Aortic valve regurgitation is not visualized. No aortic stenosis is present. Pulmonic Valve: The pulmonic valve was normal in structure. Pulmonic valve regurgitation is not visualized. No evidence of pulmonic stenosis. Aorta: The aortic root is normal in size and structure. Venous: The inferior vena cava is normal in size with greater than 50% respiratory variability, suggesting right atrial pressure of 3 mmHg. IAS/Shunts: No atrial level shunt detected by color flow Doppler.  LEFT VENTRICLE PLAX 2D LVIDd:         4.80 cm     Diastology LVIDs:         4.10 cm     LV e' medial:    4.46 cm/s LV PW:         1.30 cm     LV E/e' medial:  11.2 LV IVS:        1.20 cm     LV e' lateral:   6.53 cm/s LVOT diam:     1.90 cm     LV E/e' lateral: 7.7 LV SV:         76 LV SV Index:   38 LVOT Area:     2.84 cm  LV Volumes (MOD) LV vol d, MOD A4C: 84.8 ml LV vol s, MOD A4C: 45.6 ml LV SV MOD A4C:     84.8 ml RIGHT VENTRICLE             IVC RV S prime:     15.20 cm/s  IVC diam: 1.70 cm TAPSE (M-mode): 2.1 cm LEFT ATRIUM             Index       RIGHT ATRIUM           Index LA diam:        3.70 cm 1.87 cm/m  RA Area:     13.40 cm LA Vol (A2C):   50.7 ml 25.63 ml/m RA Volume:   32.30 ml  16.33 ml/m LA Vol (A4C):   54.3 ml 27.45 ml/m LA Biplane Vol: 55.8 ml 28.20 ml/m  AORTIC VALVE LVOT Vmax:   117.00 cm/s LVOT Vmean:  74.500 cm/s LVOT VTI:    0.267 m  AORTA Ao Root diam: 3.10 cm Ao Asc diam:  2.80 cm  MITRAL VALVE MV Area (PHT): 2.62 cm    SHUNTS MV Decel Time: 289 msec    Systemic VTI:  0.27 m MV E velocity: 50.10 cm/s  Systemic Diam: 1.90 cm MV A velocity: 69.70 cm/s MV E/A ratio:  0.72 Mihai Croitoru MD Electronically signed by Thurmon Fair MD Signature Date/Time: 04/04/2021/11:29:19 AM    Final    CT HEAD CODE STROKE WO CONTRAST  Result Date: 04/04/2021 CLINICAL DATA:  Code stroke.   Ataxia and dysarthria. EXAM: CT HEAD WITHOUT CONTRAST CT ANGIOGRAPHY OF THE HEAD AND NECK TECHNIQUE: Contiguous axial images were obtained from the base of the skull through the vertex without intravenous contrast. Multidetector CT imaging of the head and neck was performed using the standard protocol during bolus administration of intravenous contrast. Multiplanar CT image reconstructions and MIPs were obtained to evaluate the vascular anatomy. Carotid stenosis measurements (when applicable) are obtained utilizing NASCET criteria, using the distal internal carotid diameter as the denominator. CONTRAST:  75mL OMNIPAQUE IOHEXOL 350 MG/ML SOLN COMPARISON:  None. FINDINGS: CT HEAD FINDINGS Brain: There is no mass, hemorrhage or extra-axial collection. The size and configuration of the ventricles and extra-axial CSF spaces are normal. The brain parenchyma is normal, without evidence of acute or chronic infarction. Vascular: No abnormal hyperdensity of the major intracranial arteries or dural venous sinuses. No intracranial atherosclerosis. Skull: The visualized skull base, calvarium and extracranial soft tissues are normal. Sinuses/Orbits: No fluid levels or advanced mucosal thickening of the visualized paranasal sinuses. No mastoid or middle ear effusion. The orbits are normal. ASPECTS (Alberta Stroke Program Early CT Score) - Ganglionic level infarction (caudate, lentiform nuclei, internal capsule, insula, M1-M3 cortex): 7 - Supraganglionic infarction (M4-M6 cortex): 3 Total score (0-10 with 10 being normal): 10 CTA NECK FINDINGS SKELETON: There is no bony spinal canal stenosis. No lytic or blastic lesion. OTHER NECK: Normal pharynx, larynx and major salivary glands. No cervical lymphadenopathy. Unremarkable thyroid gland. UPPER CHEST: No pneumothorax or pleural effusion. No nodules or masses. AORTIC ARCH: There is no calcific atherosclerosis of the aortic arch. There is no aneurysm, dissection or hemodynamically  significant stenosis of the visualized portion of the aorta. Conventional 3 vessel aortic branching pattern. The visualized proximal subclavian arteries are widely patent. RIGHT CAROTID SYSTEM: Normal without aneurysm, dissection or stenosis. LEFT CAROTID SYSTEM: Normal without aneurysm, dissection or stenosis. VERTEBRAL ARTERIES: Left dominant configuration. Both origins are clearly patent. There is no dissection, occlusion or flow-limiting stenosis to the skull base (V1-V3 segments). CTA HEAD FINDINGS POSTERIOR CIRCULATION: --Vertebral arteries: Normal V4 segments. --Inferior cerebellar arteries: Normal. --Basilar artery: Normal. --Superior cerebellar arteries: Normal. --Posterior cerebral arteries (PCA): Mild distal left PCA narrowing. Normal right. ANTERIOR CIRCULATION: --Intracranial internal carotid arteries: Normal. --Anterior cerebral arteries (ACA): Normal. Both A1 segments are present. Patent anterior communicating artery (a-comm). --Middle cerebral arteries (MCA): Normal. VENOUS SINUSES: As permitted by contrast timing, patent. ANATOMIC VARIANTS: Hypoplastic left ACA A1 segment, a common variant. Review of the MIP images confirms the above findings. IMPRESSION: 1. No intracranial hemorrhage.  ASPECTS is 10. 2. No emergent large vessel occlusion or hemodynamically significant stenosis by NASCET criteria. 3. Normal carotid and vertebral arteries. These results were called by telephone at the time of interpretation on 04/04/2021 at 1:14 am to provider ERIC [facility] , who verbally acknowledged these results. Electronically Signed   By: Deatra Robinson M.D.   On: 04/04/2021 01:18   CT ANGIO HEAD CODE STROKE  Result Date: 04/04/2021 CLINICAL DATA:  Code stroke.  Ataxia and dysarthria.  EXAM: CT HEAD WITHOUT CONTRAST CT ANGIOGRAPHY OF THE HEAD AND NECK TECHNIQUE: Contiguous axial images were obtained from the base of the skull through the vertex without intravenous contrast. Multidetector CT imaging of the head and  neck was performed using the standard protocol during bolus administration of intravenous contrast. Multiplanar CT image reconstructions and MIPs were obtained to evaluate the vascular anatomy. Carotid stenosis measurements (when applicable) are obtained utilizing NASCET criteria, using the distal internal carotid diameter as the denominator. CONTRAST:  75mL OMNIPAQUE IOHEXOL 350 MG/ML SOLN COMPARISON:  None. FINDINGS: CT HEAD FINDINGS Brain: There is no mass, hemorrhage or extra-axial collection. The size and configuration of the ventricles and extra-axial CSF spaces are normal. The brain parenchyma is normal, without evidence of acute or chronic infarction. Vascular: No abnormal hyperdensity of the major intracranial arteries or dural venous sinuses. No intracranial atherosclerosis. Skull: The visualized skull base, calvarium and extracranial soft tissues are normal. Sinuses/Orbits: No fluid levels or advanced mucosal thickening of the visualized paranasal sinuses. No mastoid or middle ear effusion. The orbits are normal. ASPECTS (Alberta Stroke Program Early CT Score) - Ganglionic level infarction (caudate, lentiform nuclei, internal capsule, insula, M1-M3 cortex): 7 - Supraganglionic infarction (M4-M6 cortex): 3 Total score (0-10 with 10 being normal): 10 CTA NECK FINDINGS SKELETON: There is no bony spinal canal stenosis. No lytic or blastic lesion. OTHER NECK: Normal pharynx, larynx and major salivary glands. No cervical lymphadenopathy. Unremarkable thyroid gland. UPPER CHEST: No pneumothorax or pleural effusion. No nodules or masses. AORTIC ARCH: There is no calcific atherosclerosis of the aortic arch. There is no aneurysm, dissection or hemodynamically significant stenosis of the visualized portion of the aorta. Conventional 3 vessel aortic branching pattern. The visualized proximal subclavian arteries are widely patent. RIGHT CAROTID SYSTEM: Normal without aneurysm, dissection or stenosis. LEFT CAROTID  SYSTEM: Normal without aneurysm, dissection or stenosis. VERTEBRAL ARTERIES: Left dominant configuration. Both origins are clearly patent. There is no dissection, occlusion or flow-limiting stenosis to the skull base (V1-V3 segments). CTA HEAD FINDINGS POSTERIOR CIRCULATION: --Vertebral arteries: Normal V4 segments. --Inferior cerebellar arteries: Normal. --Basilar artery: Normal. --Superior cerebellar arteries: Normal. --Posterior cerebral arteries (PCA): Mild distal left PCA narrowing. Normal right. ANTERIOR CIRCULATION: --Intracranial internal carotid arteries: Normal. --Anterior cerebral arteries (ACA): Normal. Both A1 segments are present. Patent anterior communicating artery (a-comm). --Middle cerebral arteries (MCA): Normal. VENOUS SINUSES: As permitted by contrast timing, patent. ANATOMIC VARIANTS: Hypoplastic left ACA A1 segment, a common variant. Review of the MIP images confirms the above findings. IMPRESSION: 1. No intracranial hemorrhage.  ASPECTS is 10. 2. No emergent large vessel occlusion or hemodynamically significant stenosis by NASCET criteria. 3. Normal carotid and vertebral arteries. These results were called by telephone at the time of interpretation on 04/04/2021 at 1:14 am [facility] , who verbally acknowledged these results. Electronically Signed   By: Deatra RobinsonKevin  Herman M.D.   On: 04/04/2021 01:18   CT ANGIO NECK CODE STROKE  Result Date: 04/04/2021 CLINICAL DATA:  Code stroke.  Ataxia and dysarthria. EXAM: CT HEAD WITHOUT CONTRAST CT ANGIOGRAPHY OF THE HEAD AND NECK TECHNIQUE: Contiguous axial images were obtained from the base of the skull through the vertex without intravenous contrast. Multidetector CT imaging of the head and neck was performed using the standard protocol during bolus administration of intravenous contrast. Multiplanar CT image reconstructions and MIPs were obtained to evaluate the vascular anatomy. Carotid stenosis measurements (when applicable) are obtained  utilizing NASCET criteria, using the distal internal carotid diameter as the denominator.  CONTRAST:  24mL OMNIPAQUE IOHEXOL 350 MG/ML SOLN COMPARISON:  None. FINDINGS: CT HEAD FINDINGS Brain: There is no mass, hemorrhage or extra-axial collection. The size and configuration of the ventricles and extra-axial CSF spaces are normal. The brain parenchyma is normal, without evidence of acute or chronic infarction. Vascular: No abnormal hyperdensity of the major intracranial arteries or dural venous sinuses. No intracranial atherosclerosis. Skull: The visualized skull base, calvarium and extracranial soft tissues are normal. Sinuses/Orbits: No fluid levels or advanced mucosal thickening of the visualized paranasal sinuses. No mastoid or middle ear effusion. The orbits are normal. ASPECTS (Alberta Stroke Program Early CT Score) - Ganglionic level infarction (caudate, lentiform nuclei, internal capsule, insula, M1-M3 cortex): 7 - Supraganglionic infarction (M4-M6 cortex): 3 Total score (0-10 with 10 being normal): 10 CTA NECK FINDINGS SKELETON: There is no bony spinal canal stenosis. No lytic or blastic lesion. OTHER NECK: Normal pharynx, larynx and major salivary glands. No cervical lymphadenopathy. Unremarkable thyroid gland. UPPER CHEST: No pneumothorax or pleural effusion. No nodules or masses. AORTIC ARCH: There is no calcific atherosclerosis of the aortic arch. There is no aneurysm, dissection or hemodynamically significant stenosis of the visualized portion of the aorta. Conventional 3 vessel aortic branching pattern. The visualized proximal subclavian arteries are widely patent. RIGHT CAROTID SYSTEM: Normal without aneurysm, dissection or stenosis. LEFT CAROTID SYSTEM: Normal without aneurysm, dissection or stenosis. VERTEBRAL ARTERIES: Left dominant configuration. Both origins are clearly patent. There is no dissection, occlusion or flow-limiting stenosis to the skull base (V1-V3 segments). CTA HEAD FINDINGS  POSTERIOR CIRCULATION: --Vertebral arteries: Normal V4 segments. --Inferior cerebellar arteries: Normal. --Basilar artery: Normal. --Superior cerebellar arteries: Normal. --Posterior cerebral arteries (PCA): Mild distal left PCA narrowing. Normal right. ANTERIOR CIRCULATION: --Intracranial internal carotid arteries: Normal. --Anterior cerebral arteries (ACA): Normal. Both A1 segments are present. Patent anterior communicating artery (a-comm). --Middle cerebral arteries (MCA): Normal. VENOUS SINUSES: As permitted by contrast timing, patent. ANATOMIC VARIANTS: Hypoplastic left ACA A1 segment, a common variant. Review of the MIP images confirms the above findings. IMPRESSION: 1. No intracranial hemorrhage.  ASPECTS is 10. 2. No emergent large vessel occlusion or hemodynamically significant stenosis by NASCET criteria. 3. Normal carotid and vertebral arteries. These results were called by telephone at the time of interpretation on 04/04/2021 at 1:14 am to provider ERIC [facility] , who verbally acknowledged these results. Electronically Signed   By: Deatra Robinson M.D.   On: 04/04/2021 01:18    (Echo,    Subjective: No chest pain or sob.   Discharge Exam: Vitals:   04/06/21 0314 04/06/21 0859  BP: 138/69 (!) 153/72  Pulse: (!) 56 (!) 57  Resp: 18 20  Temp: 98 F (36.7 C) 97.6 F (36.4 C)  SpO2: 98% 100%   Vitals:   04/05/21 2009 04/05/21 2329 04/06/21 0314 04/06/21 0859  BP:  (!) 144/72 138/69 (!) 153/72  Pulse: 76 61 (!) 56 (!) 57  Resp: 15 18 18 20   Temp:  98.5 F (36.9 C) 98 F (36.7 C) 97.6 F (36.4 C)  TempSrc:  Oral Oral Oral  SpO2: 95% 96% 98% 100%  Weight:      Height:        General: Pt is alert, awake, not in acute distress Cardiovascular: RRR, S1/S2 +, no rubs, no gallops Respiratory: CTA bilaterally, no wheezing, no rhonchi Abdominal: Soft, NT, ND, bowel sounds + Extremities: no edema, no cyanosis    The results of significant diagnostics from this hospitalization  (including imaging, microbiology, ancillary and laboratory) are listed  below for reference.     Microbiology: Recent Results (from the past 240 hour(s))  Resp Panel by RT-PCR (Flu A&B, Covid) Nasopharyngeal Swab     Status: None   Collection Time: 04/04/21 12:22 AM   Specimen: Nasopharyngeal Swab; Nasopharyngeal(NP) swabs in vial transport medium  Result Value Ref Range Status   SARS Coronavirus 2 by RT PCR NEGATIVE NEGATIVE Final    Comment: (NOTE) SARS-CoV-2 target nucleic acids are NOT DETECTED.  The SARS-CoV-2 RNA is generally detectable in upper respiratory specimens during the acute phase of infection. The lowest concentration of SARS-CoV-2 viral copies this assay can detect is 138 copies/mL. A negative result does not preclude SARS-Cov-2 infection and should not be used as the sole basis for treatment or other patient management decisions. A negative result may occur with  improper specimen collection/handling, submission of specimen other than nasopharyngeal swab, presence of viral mutation(s) within the areas targeted by this assay, and inadequate number of viral copies(<138 copies/mL). A negative result must be combined with clinical observations, patient history, and epidemiological information. The expected result is Negative.  Fact Sheet for Patients:  BloggerCourse.com  Fact Sheet for Healthcare Providers:  SeriousBroker.it  This test is no t yet approved or cleared by the [place] FDA and  has been authorized for detection and/or diagnosis of SARS-CoV-2 by FDA under an Emergency Use Authorization (EUA). This EUA will remain  in effect (meaning this test can be used) for the duration of the COVID-19 declaration under Section 564(b)(1) of the Act, 21 U.S.C.section 360bbb-3(b)(1), unless the authorization is terminated  or revoked sooner.       Influenza A by PCR NEGATIVE NEGATIVE Final   Influenza B by PCR  NEGATIVE NEGATIVE Final    Comment: (NOTE) The Xpert Xpress SARS-CoV-2/FLU/RSV plus assay is intended as an aid in the diagnosis of influenza from Nasopharyngeal swab specimens and should not be used as a sole basis for treatment. Nasal washings and aspirates are unacceptable for Xpert Xpress SARS-CoV-2/FLU/RSV testing.  Fact Sheet for Patients: BloggerCourse.com  Fact Sheet for Healthcare Providers: SeriousBroker.it  This test is not yet approved or cleared by the [place] FDA and has been authorized for detection and/or diagnosis of SARS-CoV-2 by FDA under an Emergency Use Authorization (EUA). This EUA will remain in effect (meaning this test can be used) for the duration of the COVID-19 declaration under Section 564(b)(1) of the Act, 21 U.S.C. section 360bbb-3(b)(1), unless the authorization is terminated or revoked.  Performed at [facility], [address] [place], [place] [address]      Labs: BNP (last 3 results) No results for input(s): BNP in the last 8760 hours. Basic Metabolic Panel: Recent Labs  Lab 04/04/21 0030 04/04/21 0053  NA 137 141  K 4.5 4.0  CL 106 107  CO2 22  --   GLUCOSE 107* 107*  BUN 20 22  CREATININE 1.83* 1.80*  CALCIUM 9.4  --    Liver Function Tests: Recent Labs  Lab 04/04/21 0030  AST 24  ALT 15  ALKPHOS 41  BILITOT 0.8  PROT 7.1  ALBUMIN 4.0   No results for input(s): LIPASE, AMYLASE in the last 168 hours. No results for input(s): AMMONIA in the last 168 hours. CBC: Recent Labs  Lab 04/04/21 0030 04/04/21 0053  WBC 9.5  --   NEUTROABS 6.4  --   HGB 16.7 16.7  HCT 48.0 49.0  MCV 90.9  --   PLT 304  --  Cardiac Enzymes: No results for input(s): CKTOTAL, CKMB, CKMBINDEX, TROPONINI in the last 168 hours. BNP: Invalid input(s): POCBNP CBG: Recent Labs  Lab 04/04/21 0036  GLUCAP 123*   D-Dimer No results for input(s): DDIMER in the last 72  hours. Hgb A1c No results for input(s): HGBA1C in the last 72 hours. Lipid Profile Recent Labs    04/04/21 0649  CHOL 187  HDL 40*  LDLCALC 114*  TRIG 167*  CHOLHDL 4.7   Thyroid function studies No results for input(s): TSH, T4TOTAL, T3FREE, THYROIDAB in the last 72 hours.  Invalid input(s): FREET3 Anemia work up No results for input(s): VITAMINB12, FOLATE, FERRITIN, TIBC, IRON, RETICCTPCT in the last 72 hours. Urinalysis    Component Value Date/Time   COLORURINE STRAW (A) 04/04/2021 0150   APPEARANCEUR CLEAR 04/04/2021 0150   LABSPEC 1.020 04/04/2021 0150   PHURINE 5.0 04/04/2021 0150   GLUCOSEU NEGATIVE 04/04/2021 0150   HGBUR NEGATIVE 04/04/2021 0150   BILIRUBINUR NEGATIVE 04/04/2021 0150   KETONESUR NEGATIVE 04/04/2021 0150   PROTEINUR NEGATIVE 04/04/2021 0150   UROBILINOGEN 0.2 06/10/2015 1353   NITRITE NEGATIVE 04/04/2021 0150   LEUKOCYTESUR NEGATIVE 04/04/2021 0150   Sepsis Labs Invalid input(s): PROCALCITONIN,  WBC,  LACTICIDVEN Microbiology Recent Results (from the past 240 hour(s))  Resp Panel by RT-PCR (Flu A&B, Covid) Nasopharyngeal Swab     Status: None   Collection Time: 04/04/21 12:22 AM   Specimen: Nasopharyngeal Swab; Nasopharyngeal(NP) swabs in vial transport medium  Result Value Ref Range Status   SARS Coronavirus 2 by RT PCR NEGATIVE NEGATIVE Final    Comment: (NOTE) SARS-CoV-2 target nucleic acids are NOT DETECTED.  The SARS-CoV-2 RNA is generally detectable in upper respiratory specimens during the acute phase of infection. The lowest concentration of SARS-CoV-2 viral copies this assay can detect is 138 copies/mL. A negative result does not preclude SARS-Cov-2 infection and should not be used as the sole basis for treatment or other patient management decisions. A negative result may occur with  improper specimen collection/handling, submission of specimen other than nasopharyngeal swab, presence of viral mutation(s) within the areas  targeted by this assay, and inadequate number of viral copies(<138 copies/mL). A negative result must be combined with clinical observations, patient history, and epidemiological information. The expected result is Negative.  Fact Sheet for Patients:  BloggerCourse.com  Fact Sheet for Healthcare Providers:  SeriousBroker.it  This test is no t yet approved or cleared by the [place] FDA and  has been authorized for detection and/or diagnosis of SARS-CoV-2 by FDA under an Emergency Use Authorization (EUA). This EUA will remain  in effect (meaning this test can be used) for the duration of the COVID-19 declaration under Section 564(b)(1) of the Act, 21 U.S.C.section 360bbb-3(b)(1), unless the authorization is terminated  or revoked sooner.       Influenza A by PCR NEGATIVE NEGATIVE Final   Influenza B by PCR NEGATIVE NEGATIVE Final    Comment: (NOTE) The Xpert Xpress SARS-CoV-2/FLU/RSV plus assay is intended as an aid in the diagnosis of influenza from Nasopharyngeal swab specimens and should not be used as a sole basis for treatment. Nasal washings and aspirates are unacceptable for Xpert Xpress SARS-CoV-2/FLU/RSV testing.  Fact Sheet for Patients: BloggerCourse.com  Fact Sheet for Healthcare Providers: SeriousBroker.it  This test is not yet approved or cleared by the [place] FDA and has been authorized for detection and/or diagnosis of SARS-CoV-2 by FDA under an Emergency Use Authorization (EUA). This EUA will remain in effect (meaning this  test can be used) for the duration of the COVID-19 declaration under Section 564(b)(1) of the Act, 21 U.S.C. section 360bbb-3(b)(1), unless the authorization is terminated or revoked.  Performed at [facility], [address], [place], [place] [address]      Time coordinating discharge:33 minutes.    SIGNED:   Kathlen Mody, MD  [facility] 04/06/2021, 10:39 AM

## 2021-04-06 NOTE — Progress Notes (Signed)
Signed        PMR Admission Coordinator Pre-Admission Assessment   Patient: Bryan Crane is an 75 y.o., male MRN: [id number] DOB: 13-Apr-1946 Height: _0  (167.6 cm) Weight: 88.5 kg   Insurance Information HMO:     PPO:      PCP:      IPA:      80/20: yes     OTHER:  PRIMARY: Medicare A & B      Policy#: [id number]      Subscriber: patient CM Name:       Phone#:      Fax#:  Pre-Cert#:       Employer:  Benefits:  Phone #: verified eligibility via [place] on 04/05/21     Name:  Eff. Date: Part A effective: 07/02/10, Part B effective: 07/02/13     Deduct: $1,556      Out of Pocket Max: NA      Life Max: NA CIR: 100% with Medicare approval      SNF: 100% days 1-20, 80% days 21-100 Outpatient: 80%     Co-Pay: 20% Home Health: 100%      Co-Pay:  DME: 80%     Co-Pay: 20% Providers: pt's choice SECONDARY: Engineer, production Supplement       Policy#: [id number]     Phone#:    Financial Counselor:       Phone#:    The "Data Collection Information Summary" for patients in Inpatient Rehabilitation Facilities with attached "Privacy Act [place] Records" was provided and verbally reviewed with: Patient and Family   Emergency Contact Information         Contact Information     Name Relation Home Work Mobile    [place] Spouse [phone]   [phone]         Current Medical History  Patient Admitting Diagnosis: Cerebellum CVA  History of Present Illness: Pt is a 75 year old male with medical hx significant for: OSA on CPAP, cardiomyopathy, CKD III, HTN, GERD, obesity. Pt presented to hospital d/t acute onset of dizziness, slurred speech, balance issues, and headache. Pt declined tPA.  CT was negative and CTA showed no LVO.  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 Echo revealed 60% EF.   Neurology recommeded Loop placement prior to d/c.  Therapy evaluations completed and CIR recommended d/t pt's deficits in functional mobility, stability, coordination, and  strength.   Complete NIHSS TOTAL: 0   Patient's medical record from [facility] has been reviewed by the rehabilitation admission coordinator and physician.   Past Medical History      Past Medical History:  Diagnosis Date  . Arthritis    . Cardiomyopathy ([place])    . CKD (chronic kidney disease)    . Dyspnea      with exertion  . Enlarged prostate    . GERD (gastroesophageal reflux disease)    . History of kidney stones    . Hypertension    . Sleep apnea      uses cpap      Family History   family history is not on file.   Prior Rehab/Hospitalizations Has the patient had prior rehab or hospitalizations prior to admission? No   Has the patient had major surgery during 100 days prior to admission? No               Current Medications   Current Facility-Administered Medications:  .  acetaminophen (TYLENOL) tablet 650 mg, 650 mg, Oral, Q4H PRN **  OR** acetaminophen (TYLENOL) 160 MG/5ML solution 650 mg, 650 mg, Per Tube, Q4H PRN **OR** acetaminophen (TYLENOL) suppository 650 mg, 650 mg, Rectal, Q4H PRN, Chotiner, Yevonne Aline, MD .  aspirin EC tablet 81 mg, 81 mg, Oral, QPM, Chotiner, Yevonne Aline, MD, 81 mg at 04/05/21 1704 .  atorvastatin (LIPITOR) tablet 40 mg, 40 mg, Oral, Daily, Metzger-Cihelka, Desiree, NP, 40 mg at 04/06/21 0937 .  brimonidine (ALPHAGAN) 0.2 % ophthalmic solution 1 drop, 1 drop, Both Eyes, TID, Cyndia Skeeters, Taye T, MD, 1 drop at 04/06/21 0942 .  clopidogrel (PLAVIX) tablet 75 mg, 75 mg, Oral, Daily, Chotiner, Yevonne Aline, MD, 75 mg at 04/06/21 0936 .  dorzolamide-timolol (COSOPT) 22.3-6.8 MG/ML ophthalmic solution 1 drop, 1 drop, Both Eyes, BID, Cyndia Skeeters, Taye T, MD, 1 drop at 04/06/21 0942 .  enoxaparin (LOVENOX) injection 40 mg, 40 mg, Subcutaneous, Q24H, Gonfa, Taye T, MD, 40 mg at 04/05/21 1657 .  lactated ringers infusion, , Intravenous, Continuous, Chotiner, Yevonne Aline, MD, Last Rate: 100 mL/hr at 04/05/21 0548, New Bag at 04/05/21 0548 .  latanoprost (XALATAN)  0.005 % ophthalmic solution 1 drop, 1 drop, Both Eyes, QHS, Chotiner, Yevonne Aline, MD, 1 drop at 04/05/21 2148 .  multivitamin with minerals tablet 1 tablet, 1 tablet, Oral, Daily, Wendee Beavers T, MD, 1 tablet at 04/06/21 0937 .  pantoprazole (PROTONIX) EC tablet 40 mg, 40 mg, Oral, Daily, Cyndia Skeeters, Taye T, MD, 40 mg at 04/06/21 0936 .  ranolazine (RANEXA) 12 hr tablet 1,000 mg, 1,000 mg, Oral, Daily, Chotiner, Yevonne Aline, MD, 1,000 mg at 04/06/21 0937 .  senna-docusate (Senokot-S) tablet 1 tablet, 1 tablet, Oral, QHS PRN, Chotiner, Yevonne Aline, MD .  sodium chloride (OCEAN) 0.65 % nasal spray 1 spray, 1 spray, Each Nare, PRN, Cyndia Skeeters, Taye T, MD .  tamsulosin (FLOMAX) capsule 0.8 mg, 0.8 mg, Oral, QPM, Chotiner, Yevonne Aline, MD, 0.8 mg at 04/05/21 1656   Patients Current Diet:     Diet Order                      Diet - low sodium heart healthy              Diet Heart Room service appropriate? Yes; Fluid consistency: Thin  Diet effective now                      Precautions / Restrictions Precautions Precautions: Fall Restrictions Weight Bearing Restrictions: No    Has the patient had 2 or more falls or a fall with injury in the past year? No   Prior Activity Level Limited Community (1-2x/wk): drives, out of house 3-4x/week since COVID pandemic   Prior Functional Level Self Care: Did the patient need help bathing, dressing, using the toilet or eating? Independent   Indoor Mobility: Did the patient need assistance with walking from room to room (with or without device)? Independent   Stairs: Did the patient need assistance with internal or external stairs (with or without device)? Independent   Functional Cognition: Did the patient need help planning regular tasks such as shopping or remembering to take medications? Independent   Home Assistive Devices / Equipment Home Equipment: Cane - single point   Prior Device Use: Indicate devices/aids used by the patient prior to current illness,  exacerbation or injury? None of the above   Current Functional Level Cognition   Arousal/Alertness: Awake/alert Overall Cognitive Status: Within Functional Limits for tasks assessed Orientation Level: Oriented X4 Safety/Judgement: Decreased awareness of  safety,Decreased awareness of deficits General Comments: Pt A/O x4 with cues. Pt following all commands and aware that he is moving better than a few days ago. Pt scored a perfect score on Short Blessed Test revealing no memory deficit and able to perform task while ambulating in a distracting environment. Attention: Focused,Alternating Focused Attention: Impaired Focused Attention Impairment: Verbal complex,Functional complex Alternating Attention: Impaired Alternating Attention Impairment: Verbal complex,Functional complex Memory: Impaired Memory Impairment: Decreased recall of new information Problem Solving: Impaired Problem Solving Impairment: Verbal complex,Functional complex Executive Function: Organizing,Self Monitoring Organizing: Impaired Organizing Impairment: Verbal complex,Functional complex Self Monitoring: Impaired Self Monitoring Impairment: Verbal complex,Functional complex Safety/Judgment: Impaired Comments: per PT notes    Extremity Assessment (includes Sensation/Coordination)   Upper Extremity Assessment: Overall WFL for tasks assessed,RUE deficits/detail,LUE deficits/detail RUE Deficits / Details: strength 4/5 RUE Sensation: WNL RUE Coordination: WNL LUE Deficits / Details: strength 4/5 LUE Sensation: WNL LUE Coordination: WNL  Lower Extremity Assessment: Overall WFL for tasks assessed,Defer to PT evaluation     ADLs   Overall ADL's : Needs assistance/impaired Eating/Feeding: Set up,Sitting Grooming: Min guard,Standing Upper Body Bathing: Min guard,Standing Lower Body Bathing: Minimal assistance,Sitting/lateral leans,Sit to/from stand Upper Body Dressing : Min guard,Standing Lower Body Dressing: Minimal  assistance,Sitting/lateral leans,Sit to/from stand,Cueing for safety Toilet Transfer: Minimal assistance,Cueing for safety,Cueing for sequencing,Ambulation,Regular Toilet,RW,Grab bars Toilet Transfer Details (indicate cue type and reason): cues for hand placement Toileting- Clothing Manipulation and Hygiene: Minimal assistance,Cueing for safety,Sitting/lateral lean,Sit to/from stand Toileting - Clothing Manipulation Details (indicate cue type and reason): LOB x1 in standing Functional mobility during ADLs: Minimal assistance,Rolling walker,Cueing for safety,Cueing for sequencing General ADL Comments: Pt requiring cues for mobility with RW and safety. Pt performing bed mobility with supervisionA and mobility iwth minguardA to minA overall with RW.  Pt stepping around bed to recliner with no AD, but use of furniture for stability. Pt appears strong and with minimal balance deficits, but could benefit from higher level ADL and challenging balance tasks.     Mobility   Overal bed mobility: Needs Assistance Bed Mobility: Supine to Sit Supine to sit: Supervision Sit to supine: Min assist General bed mobility comments: minA to transition to sitting EOB with min-modA to steady once in sitting position. pt with posterior lean, needing cues to maintain trunk forward and feet on floor     Transfers   Overall transfer level: Needs assistance Equipment used: Rolling walker (2 wheeled),1 person hand held assist Transfers: Sit to/from [place] to Stand: Min guard Stand pivot transfers: Min assist General transfer comment: minA for stability with LOB x1 at commode     Ambulation / Gait / Stairs / Wheelchair Mobility   Ambulation/Gait Ambulation/Gait assistance: Min assist,Mod assist Gait Distance (Feet): 45 Feet Assistive device: Rolling walker (2 wheeled) Gait Pattern/deviations: Step-through pattern,Decreased stride length,Decreased weight shift to right,Decreased stance time -  right,Ataxic,Staggering left General Gait Details: pt with multiple LOB to L requiring modA to steady and recover. difficulty maintaining RW on ground at times without assist. inconsistent step lengths but generally short with decerased wt shift to RLE Gait velocity: decreased Gait velocity interpretation: <1.31 ft/sec, indicative of household ambulator     Posture / Balance Dynamic Sitting Balance Sitting balance - Comments: reliant on UE support and support from therapist Static Standing Balance Tandem Stance - Right Leg: 0 Tandem Stance - Left Leg: 2 [place] - Eyes Opened: 15 (mild lateral and A-P sway) [place] - Eyes Closed: 8 (significant lateral and A-P sway) Balance Overall  balance assessment: Needs assistance Sitting-balance support: Single extremity supported,Feet supported Sitting balance-Leahy Scale: Good Sitting balance - Comments: reliant on UE support and support from therapist Postural control: Posterior lean,Right lateral lean,Left lateral lean Standing balance support: Bilateral upper extremity supported Standing balance-Leahy Scale: Poor Standing balance comment: reliant on BUE support and minguardA Tandem Stance - Right Leg: 0 Tandem Stance - Left Leg: 2 [place] - Eyes Opened: 15 (mild lateral and A-P sway) [place] - Eyes Closed: 8 (significant lateral and A-P sway)     Special needs/care consideration Continuous Drip IV  lactated ringers infusion: 117m/hr and Designated visitor DChord Takahashi wife    Previous Home Environment (from acute therapy documentation) Living Arrangements: Spouse/significant other  Lives With: Spouse Available Help at Discharge: Family,Available 24 hours/day Type of Home: House Home Layout: One level Home Access: Stairs to enter Entrance Stairs-Rails: Right,Left,Can reach both Entrance Stairs-Number of Steps: 3 in front. No steps in back Bathroom Shower/Tub: TChiropodist Handicapped height Bathroom  Accessibility: Yes How Accessible: Accessible via walker [place] No   Discharge Living Setting Plans for Discharge Living Setting: Patient's home Type of Home at Discharge: House Discharge Home Layout: One level Discharge Home Access: Stairs to enter Entrance Stairs-Rails: RWinonareach both Entrance Stairs-Number of Steps: 3 steps in front. No steps in back Discharge Bathroom Shower/Tub: Tub/shower unit Discharge Bathroom Toilet: Handicapped height Discharge Bathroom Accessibility: Yes How Accessible: Accessible via walker Does the patient have any problems obtaining your medications?: No   Social/Family/Support Systems Anticipated Caregiver: DTonny Isensee wife Anticipated Caregiver's Contact Information: [phone] Caregiver Availability: 24/7 Discharge Plan Discussed with Primary Caregiver: Yes Is Caregiver In Agreement with Plan?: Yes Does Caregiver/Family have Issues with Lodging/Transportation while Pt is in Rehab?: No   Goals Patient/Family Goal for Rehab: Mod I-Supervision: PT/OT; Mod I-I: ST Expected length of stay: 7-10 days Pt/Family Agrees to Admission and willing to participate: Yes Program Orientation Provided & Reviewed with Pt/Caregiver Including Roles  & Responsibilities: Yes   Decrease burden of Care through IP rehab admission: NA   Possible need for SNF placement upon discharge: Not anticipated   Patient Condition: [facility] spoken with CSW, and patient and spouse. I met with patient at the bedside for inpatient rehabilitation assessment.  Patient will benefit from ongoing PT, OT and SLP, can actively participate in 3 hours of therapy a day 5 days of the week, and can make measurable gains during the admission.  Patient will also benefit from the coordinated team approach during an Inpatient Acute Rehabilitation admission.  The patient will receive intensive therapy as well as  Rehabilitation physician, nursing, social worker, and care management interventions.  Due to safety, disease management, medication administration and patient education the patient requires 24 hour a day rehabilitation nursing.  The patient is currently Min A-Mod A with mobility and Min G-Min A with basic ADLs.  Discharge setting and therapy post discharge at home with home health is anticipated.  Patient has agreed to participate in the Acute Inpatient Rehabilitation Program and will admit today.   Preadmission Screen Completed By:  LBethel Born 04/06/2021 10:49 AM ______________________________________________________________________   Discussed status with Dr. SNaaman Plummeron 04/06/21  at 10:49 AM and received approval for admission today.   Admission Coordinator:  LBethel Born CCC-SLP, time 10:49 AM/Date 04/06/21     Assessment/Plan: Diagnosis: right cerebellar CVA 1. Does the need for close, 24 hr/day Medical supervision in concert with  the patient's rehab needs make it unreasonable for this patient to be served in a less intensive setting? Yes 2. Co-Morbidities requiring supervision/potential complications: CM, HTN, CKD, OSA 3. Due to bladder management, bowel management, safety, skin/wound care, disease management, medication administration, pain management and patient education, does the patient require 24 hr/day rehab nursing? Yes 4. Does the patient require coordinated care of a physician, rehab nurse, PT, OT, and SLP to address physical and functional deficits in the context of the above medical diagnosis(es)? Yes Addressing deficits in the following areas: balance, endurance, locomotion, strength, transferring, bowel/bladder control, bathing, dressing, feeding, grooming, toileting, speech and psychosocial support 5. Can the patient actively participate in an intensive therapy program of at least 3 hrs of therapy 5 days a week? Yes 6. The potential for patient to make  measurable gains while on inpatient rehab is excellent 7. Anticipated functional outcomes upon discharge from inpatient rehab: modified independent and supervision PT, modified independent and supervision OT, modified independent SLP 8. Estimated rehab length of stay to reach the above functional goals is: 7-10 days 9. Anticipated discharge destination: Home 10. Overall Rehab/Functional Prognosis: excellent     MD Signature: Meredith Staggers, MD, [facility] 04/06/2021

## 2021-04-06 NOTE — Progress Notes (Signed)
Pt arrive to 4M13 per wheelchair. Family at bedside. Policies reviewed. Pt oriented to rehab.Orders released.  Vitals obtained, loop recorder dressing outlined. Increase in drainage noted since 1530. Report given to night shift.  Mylo Red, LPN

## 2021-04-06 NOTE — Progress Notes (Signed)
Physical Therapy Treatment Patient Details Name: Bryan Crane MRN: [id number] DOB: 18-Dec-1945 Today's Date: 04/06/2021    History of Present Illness The pt is a 75 yo male presenting 6/3 due to acute onset of dizziness and balance issues. 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 PMH includes: HTN, cardiomyopathy, CKD III, and kidney stones.    PT Comments    Pt progressing well with mobility, demonstrating improving tolerance for mobility and exercise today. Pt overall requiring min assist for steadying and posture during mobility, pt with worsening ataxia and knee hyperextension with fatigue. Pt tolerated LE exercises for strengthening and coordination well, with rest breaks between repetitions. PT continuing to recommend CIR post-acutely.    Follow Up Recommendations  CIR     Equipment Recommendations  Rolling walker with 5" wheels;3in1 (PT) (defer to post acute)    Recommendations for Other Services Rehab consult     Precautions / Restrictions Precautions Precautions: Fall Restrictions Weight Bearing Restrictions: No    Mobility  Bed Mobility Overal bed mobility: Needs Assistance Bed Mobility: Sit to Supine       Sit to supine: Min assist   General bed mobility comments: pt sitting EOB with NT setting up bath; min assist for return to supine for LE lifting into bed.    Transfers Overall transfer level: Needs assistance Equipment used: 1 person hand held assist;Rolling walker (2 wheeled) Transfers: Sit to/from Stand Sit to Stand: Min assist;Min guard         General transfer comment: Initially min assist for initial power up, transitioning to min guard for safety with repetition. STS with RW and HHA, repeated transfers as intervention  Ambulation/Gait Ambulation/Gait assistance: Min assist Gait Distance (Feet): 85 Feet (x2 - to and from rehab gym) Assistive device: Rolling walker (2 wheeled);1 person hand held assist Gait Pattern/deviations:  Step-through pattern;Decreased stride length;Decreased weight shift to right;Ataxic;Drifts right/left;Shuffle Gait velocity: decr   General Gait Details: Min assist to steady, correct occasional minor LOB, cues for upright posture, increasing step length. With increasing fatigue, PT noticed increasing knee hyperextension bilat especially with HHA +1 gait trial.   Stairs             Wheelchair Mobility    Modified Rankin (Stroke Patients Only) Modified Rankin (Stroke Patients Only) Pre-Morbid Rankin Score: No symptoms Modified Rankin: Moderately severe disability     Balance Overall balance assessment: Needs assistance Sitting-balance support: Single extremity supported;Feet supported Sitting balance-Leahy Scale: Good     Standing balance support: Single extremity supported Standing balance-Leahy Scale: Poor Standing balance comment: at least single HHA                            Cognition Arousal/Alertness: Awake/alert Behavior During Therapy: WFL for tasks assessed/performed Overall Cognitive Status: Impaired/Different from baseline Area of Impairment: Safety/judgement                         Safety/Judgement: Decreased awareness of deficits;Decreased awareness of safety   Problem Solving: Requires tactile cues;Requires verbal cues General Comments: Pt bumping into objects in the hallway towards the R, cues to correct. Pt lacking postural and safety awareness during standing tasks, requests to stand by himself during bathing and toileting although pt is a high fall risk      Exercises Other Exercises Other Exercises: Sit to stand, traditional: x5, without UE support, emphasis on slow eccentric lower Other Exercises: STS, tandem stance, bilat:  x5 each side, emphasis on standing in midline posture. Pt with heavy L lateral leaning with tandem R foot in rear of BOS Other Exercises: step ups, x5 bilat, to address impaired coordination and strength,  SL support    General Comments        Pertinent Vitals/Pain Pain Assessment: No/denies pain Pain Intervention(s): Monitored during session;Limited activity within patient's tolerance    Home Living                      Prior Function            PT Goals (current goals can now be found in the care plan section) Acute Rehab PT Goals Patient Stated Goal: go to CIR before home PT Goal Formulation: With patient Time For Goal Achievement: 04/18/21 Potential to Achieve Goals: Good Progress towards PT goals: Progressing toward goals    Frequency    Min 4X/week      PT Plan Current plan remains appropriate    Co-evaluation              AM-PAC PT "6 Clicks" Mobility   Outcome Measure  Help needed turning from your back to your side while in a flat bed without using bedrails?: None Help needed moving from lying on your back to sitting on the side of a flat bed without using bedrails?: A Little Help needed moving to and from a bed to a chair (including a wheelchair)?: A Little Help needed standing up from a chair using your arms (e.g., wheelchair or bedside chair)?: A Little Help needed to walk in hospital room?: A Little Help needed climbing 3-5 steps with a railing? : A Lot 6 Click Score: 18    End of Session Equipment Utilized During Treatment: Gait belt Activity Tolerance: Patient tolerated treatment well Patient left: in bed;with call bell/phone within reach;with bed alarm set;with family/visitor present Nurse Communication: Mobility status PT Visit Diagnosis: Other abnormalities of gait and mobility (R26.89);Unsteadiness on feet (R26.81)     Time: 7282-0601 PT Time Calculation (min) (ACUTE ONLY): 32 min  Charges:  $Gait Training: 8-22 mins $Therapeutic Exercise: 8-22 mins                     Marye Round, PT DPT Acute Rehabilitation Services Pager [phone]  Office [phone]    Tyrone Apple E Christain [place] 04/06/2021, 4:12 PM

## 2021-04-06 NOTE — H&P (Signed)
Physical Medicine and Rehabilitation Admission H&P        Chief Complaint  Patient presents with  . Dizziness  : HPI: Bryan Crane is a 75 year old right-handed male with history of cardiomyopathy, CKD stage III, hypertension, sleep apnea.  Per chart review lives with spouse.  1 level home 3 steps to entry.  Reportedly independent prior to admission and active.  Presented 04/03/2021 with dizziness slurred speech unsteady gait of acute onset.  Cranial CT scan as well as CTA head and neck showed no intracranial hemorrhage.  No emergent large vessel occlusion or hemodynamically significant stenosis.  Patient did not receive tPA.  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  No hemorrhage or mass-effect.  Echocardiogram with ejection fraction of 60 to 65% no evidence of mitral stenosis.  The left ventricle had no regional wall motion abnormalities.  Admission chemistries unremarkable except glucose 107 creatinine 1.83 urine drug screen negative.  Currently maintained on aspirin 81 mg daily and Plavix 75 mg daily for CVA prophylaxis x3 weeks and aspirin alone.  Subcutaneous Lovenox for DVT prophylaxis.  Awaiting plan for loop recorder placement.  Therapy evaluations completed due to patient's decreased functional ability gait abnormality was admitted for a comprehensive rehab program.   Review of Systems  Constitutional: Negative for chills and fever.  HENT: Negative for hearing loss.   Eyes: Positive for blurred vision. Negative for double vision.  Respiratory: Negative for cough.        Dyspnea on exertion  Cardiovascular: Positive for leg swelling. Negative for chest pain.  Gastrointestinal: Positive for constipation. Negative for heartburn.       GERD  Genitourinary: Positive for urgency. Negative for dysuria and flank pain.  Musculoskeletal: Positive for myalgias.  Skin: Negative for rash.  Neurological: Positive for dizziness, speech change and  weakness.  All other systems reviewed and are negative.   Past Medical History:  Diagnosis Date  . Arthritis    . Cardiomyopathy (HCC)    . CKD (chronic kidney disease)    . Dyspnea      with exertion  . Enlarged prostate    . GERD (gastroesophageal reflux disease)    . History of kidney stones    . Hypertension    . Sleep apnea      uses cpap         Past Surgical History:  Procedure Laterality Date  . CARDIAC CATHETERIZATION        "years ago" - states it was clear  . COLONOSCOPY      . KIDNEY STONE SURGERY      . ORIF ELBOW FRACTURE Left 09/30/2018    Procedure: Open reduction internal fixation left elbow fracture with repair reconstruction and  allograft;  Surgeon: Dominica SeverinGramig, William, MD;  [facility];  Service: Orthopedics;  Laterality: Left;  90 mins  . TONSILLECTOMY        History reviewed. No pertinent family history. Social History:  reports that he has never smoked. He has never used smokeless tobacco. He reports that he does not drink alcohol and does not use drugs. Allergies: No Known Allergies       Medications Prior to Admission  Medication Sig Dispense Refill  . acetaminophen (TYLENOL) 500 MG tablet Take 500 mg by mouth 2 (two) times daily as needed for moderate pain or headache.      . brimonidine (ALPHAGAN) 0.2 % ophthalmic solution Place 1 drop into both eyes in the  morning and at bedtime.      . dorzolamide-timolol (COSOPT) 22.3-6.8 MG/ML ophthalmic solution Place 1 drop into both eyes 2 (two) times daily.      . lansoprazole (PREVACID) 30 MG capsule Take 30 mg by mouth daily.      Marland Kitchen latanoprost (XALATAN) 0.005 % ophthalmic solution Place 1 drop into both eyes at bedtime.   6  . Multiple Vitamin (MULTIVITAMIN WITH MINERALS) TABS tablet Take 1 tablet by mouth daily.      . perindopril (ACEON) 8 MG tablet Take 8 mg by mouth daily.      . Probiotic Product (PROBIOTIC DAILY PO) Take 1 capsule by mouth daily.      Marland Kitchen RANEXA 1000 MG SR tablet Take 1,000 mg by  mouth at bedtime.   3  . sodium chloride (OCEAN) 0.65 % SOLN nasal spray Place 1 spray into both nostrils as needed for congestion.      Marland Kitchen spironolactone (ALDACTONE) 25 MG tablet Take 25 mg by mouth every Monday, Wednesday, and Friday.      . tamsulosin (FLOMAX) 0.4 MG CAPS capsule Take 0.8 mg by mouth every evening.          Drug Regimen Review Drug regimen was reviewed and remains appropriate with no significant issues identified   Home: Home Living Family/patient expects to be discharged to:: Private residence Living Arrangements: Spouse/significant other Available Help at Discharge: Family,Available 24 hours/day Type of Home: House Home Access: Stairs to enter [employer] of Steps: 3 in front. No steps in back Entrance Stairs-Rails: Right,Left,Can reach both Home Layout: One level Bathroom Shower/Tub: Engineer, manufacturing systems: Handicapped height Bathroom Accessibility: Yes Home Equipment: Medical laboratory scientific officer - single point  Lives With: Spouse   Functional History: Prior Function Level of Independence: Independent Comments: independent with all activity, walking ~1 hour at a time and biking at house for activity   Functional Status:  Mobility: Bed Mobility Overal bed mobility: Needs Assistance Bed Mobility: Supine to Sit Supine to sit: Supervision Sit to supine: Min assist General bed mobility comments: minA to transition to sitting EOB with min-modA to steady once in sitting position. pt with posterior lean, needing cues to maintain trunk forward and feet on floor Transfers Overall transfer level: Needs assistance Equipment used: Rolling walker (2 wheeled),1 person hand held assist Transfers: Sit to/from [employer] Sit to Stand: Min guard Stand pivot transfers: Min assist General transfer comment: minA for stability with LOB x1 at commode Ambulation/Gait Ambulation/Gait assistance: Min assist,Mod assist Gait Distance (Feet): 45 Feet Assistive  device: Rolling walker (2 wheeled) Gait Pattern/deviations: Step-through pattern,Decreased stride length,Decreased weight shift to right,Decreased stance time - right,Ataxic,Staggering left General Gait Details: pt with multiple LOB to L requiring modA to steady and recover. difficulty maintaining RW on ground at times without assist. inconsistent step lengths but generally short with decerased wt shift to RLE Gait velocity: decreased Gait velocity interpretation: <1.31 ft/sec, indicative of household ambulator   ADL: ADL Overall ADL's : Needs assistance/impaired Eating/Feeding: Set up,Sitting Grooming: Min guard,Standing Upper Body Bathing: Min guard,Standing Lower Body Bathing: Minimal assistance,Sitting/lateral leans,Sit to/from stand Upper Body Dressing : Min guard,Standing Lower Body Dressing: Minimal assistance,Sitting/lateral leans,Sit to/from stand,Cueing for safety Toilet Transfer: Minimal assistance,Cueing for safety,Cueing for sequencing,Ambulation,Regular Toilet,RW,Grab bars Toilet Transfer Details (indicate cue type and reason): cues for hand placement Toileting- Clothing Manipulation and Hygiene: Minimal assistance,Cueing for safety,Sitting/lateral lean,Sit to/from stand Toileting - Clothing Manipulation Details (indicate cue type and reason): LOB x1 in standing Functional mobility during  ADLs: Minimal assistance,Rolling walker,Cueing for safety,Cueing for sequencing General ADL Comments: Pt requiring cues for mobility with RW and safety. Pt performing bed mobility with supervisionA and mobility iwth minguardA to minA overall with RW.  Pt stepping around bed to recliner with no AD, but use of furniture for stability. Pt appears strong and with minimal balance deficits, but could benefit from higher level ADL and challenging balance tasks.   Cognition: Cognition Overall Cognitive Status: Within Functional Limits for tasks assessed Arousal/Alertness: Awake/alert Orientation  Level: Oriented X4 Attention: Focused,Alternating Focused Attention: Impaired Focused Attention Impairment: Verbal complex,Functional complex Alternating Attention: Impaired Alternating Attention Impairment: Verbal complex,Functional complex Memory: Impaired Memory Impairment: Decreased recall of new information Problem Solving: Impaired Problem Solving Impairment: Verbal complex,Functional complex Executive Function: Organizing,Self Monitoring Organizing: Impaired Organizing Impairment: Verbal complex,Functional complex Self Monitoring: Impaired Self Monitoring Impairment: Verbal complex,Functional complex Safety/Judgment: Impaired Comments: per PT notes Cognition Arousal/Alertness: Awake/alert Behavior During Therapy: WFL for tasks assessed/performed Overall Cognitive Status: Within Functional Limits for tasks assessed Area of Impairment: Safety/judgement,Problem solving,Awareness Safety/Judgement: Decreased awareness of safety,Decreased awareness of deficits Awareness: Intellectual Problem Solving: Decreased initiation,Difficulty sequencing,Requires verbal cues General Comments: Pt A/O x4 with cues. Pt following all commands and aware that he is moving better than a few days ago. Pt scored a perfect score on Short Blessed Test revealing no memory deficit and able to perform task while ambulating in a distracting environment.   Physical Exam: Blood pressure (!) 153/72, pulse (!) 57, temperature 97.6 F (36.4 C), temperature source Oral, resp. rate 20, height 5\' 6"  (1.676 m), weight 88.5 kg, SpO2 100 %. Physical Exam Constitutional:      General: He is not in acute distress.    Appearance: Normal appearance. He is not ill-appearing.  HENT:     Head: Normocephalic and atraumatic.     Nose: Nose normal.     Mouth/Throat:     Mouth: Mucous membranes are moist.  Eyes:     Pupils: Pupils are equal, round, and reactive to light.     Comments: Vision limited OS  Cardiovascular:      Rate and Rhythm: Regular rhythm. Bradycardia present.     Heart sounds: No murmur heard. No gallop.   Pulmonary:     Effort: Pulmonary effort is normal. No respiratory distress.     Breath sounds: No wheezing.  Abdominal:     General: Bowel sounds are normal. There is no distension.     Tenderness: There is no abdominal tenderness. There is no rebound.  Genitourinary:    Penis: Normal.   Musculoskeletal:        General: No swelling or tenderness. Normal range of motion.     Cervical back: Normal range of motion and neck supple.  Skin:    General: Skin is warm and dry.  Neurological:     Comments: Patient is alert in no acute distress.  Makes eye contact with examiner.  Provides name and age.  Follows simple commands. Mild dysarthria.  Fair insight and awareness. Mild right limb ataxia with past-pointing. Strength 4/5 RUE and RLE and 4+ LUE and LLE. No sensory changes. DTR's 1+       Lab Results Last 48 Hours  No results found for this or any previous visit (from the past 48 hour(s)).   Imaging Results (Last 48 hours)  No results found.           Medical Problem List and Plan: 1.  Dizziness/slurred speech with limb and truncal ataxia secondary  to small infarct of the anterior superior right cerebellum.  No hemorrhage or mass-effect.  Status post loop recorder.             -patient may  shower             -ELOS/Goals: 7-10 days, mod I to supervision with PT and OT and mod I with SLP. 2.  Antithrombotics: -DVT/anticoagulation: Subcutaneous Lovenox             -antiplatelet therapy: Aspirin 81 mg daily and Plavix 75 mg daily x3 weeks then aspirin alone 3. Pain Management: Tylenol as needed 4. Mood: Provide emotional support             -antipsychotic agents: N/A 5. Neuropsych: This patient is capable of making decisions on his own behalf. 6. Skin/Wound Care: Routine skin checks 7. Fluids/Electrolytes/Nutrition: Routine in and outs with follow-up chemistries on admit 8.   Hyperlipidemia.  Lipitor 9.  CKD stage III.  Admission creatinine 1.83.  Follow-up chemistries on admit tomorrow 10.  BPH.  Flomax 0.8 mg daily.  Check PVR's  -up to toilet to void 11.  History of cardiomyopathy.  Continue Ranexa 1000 mg daily.  No chest pain or shortness of breath             -check weights weekly 12.  OSA.  CPAP.                  Bryan Rossetti Angiulli, Bryan Crane 04/06/2021  I have personally performed a face to face diagnostic evaluation of this patient and formulated the key components of the plan.  Additionally, I have personally reviewed laboratory data, imaging studies, as well as relevant notes and concur with the physician assistant's documentation above.  The patient's status has not changed from the original H&P.  Any changes in documentation from the acute care chart have been noted above.  Ranelle Oyster, MD, [place]

## 2021-04-06 NOTE — Progress Notes (Signed)
Inpatient Rehab Admissions Coordinator:  There is a bed available in CIR for pt to admit today. Dr. Blake Divine is aware and in agreement. NSG, TOC, pt/family aware.  Pt to be admitted after loop recorder placed per Dr. Blake Divine.   Wolfgang Phoenix, MS, CCC-SLP Admissions Coordinator [phone]

## 2021-04-06 NOTE — Consult Note (Addendum)
ELECTROPHYSIOLOGY CONSULT NOTE  Patient ID: Bryan Crane MRN: [id number], DOB/AGE: 76/26/1947   Admit date: 04/03/2021 Date of Consult: 04/06/2021  Primary Physician: Majel Homer, MD Primary Cardiologist: none locally, previously Dr Wyona Almas at [phone] in [place] Reason for Consultation: Cryptogenic stroke; recommendations regarding Implantable Loop Recorder, requested by Drs Kristine Garbe and Dr. Pearlean Brownie  History of Present Illness  Bryan Crane was admitted on 04/03/2021 with unsteady gait, dizziness and speech difficulty, found with stroke.    PMHx includes: CKD (III), sleep apnea w/CPAP, HTN, GERD, obseity,  The patient reports long hx of HCM.  Neurology notes:Right anterior superior cerebellum Stroke:  cardieoembolic appearing, but no source found, thus Cryptogenic at this time. Recommend Loop placement  .  he has undergone workup for stroke including echocardiogram and carotid angio.  The patient has been monitored on telemetry which has demonstrated sinus rhythm with no arrhythmias.  Neurology team has deferred TEE   Echocardiogram this admission demonstrated   IMPRESSIONS   1. Left ventricular ejection fraction, by estimation, is 60 to 65%. The  left ventricle has normal function. The left ventricle has no regional  wall motion abnormalities. Left ventricular diastolic parameters were  normal.   2. Right ventricular systolic function is normal. The right ventricular  size is normal.   3. The mitral valve is normal in structure. No evidence of mitral valve  regurgitation. No evidence of mitral stenosis.   4. The aortic valve is normal in structure. Aortic valve regurgitation is  not visualized. No aortic stenosis is present.   5. The inferior vena cava is normal in size with greater than 50%  respiratory variability, suggesting right atrial pressure of 3 mmHg.  FINDINGS   Left Ventricle: Left ventricular ejection fraction, by estimation, is 60  to 65%. The left ventricle  has normal function. The left ventricle has no  regional wall motion abnormalities. The left ventricular internal cavity  size was normal in size. There is   no left ventricular hypertrophy. Left ventricular diastolic parameters  were normal. Normal left ventricular filling pressure.   Right Ventricle: The right ventricular size is normal. No increase in  right ventricular wall thickness. Right ventricular systolic function is  normal.   Left Atrium: Left atrial size was normal in size.   Right Atrium: Right atrial size was normal in size.   Pericardium: There is no evidence of pericardial effusion.   Mitral Valve: The mitral valve is normal in structure. No evidence of  mitral valve regurgitation. No evidence of mitral valve stenosis.   Tricuspid Valve: The tricuspid valve is normal in structure. Tricuspid  valve regurgitation is not demonstrated. No evidence of tricuspid  stenosis.   Aortic Valve: The aortic valve is normal in structure. Aortic valve  regurgitation is not visualized. No aortic stenosis is present.   Pulmonic Valve: The pulmonic valve was normal in structure. Pulmonic valve  regurgitation is not visualized. No evidence of pulmonic stenosis.   Aorta: The aortic root is normal in size and structure.   Venous: The inferior vena cava is normal in size with greater than 50%  respiratory variability, suggesting right atrial pressure of 3 mmHg.   IAS/Shunts: No atrial level shunt detected by color flow Doppler.      Lab work is reviewed.    Prior to admission, the patient denies chest pain, shortness of breath, dizziness, palpitations, or syncope.  They are recovering from their stroke with plans to CIR at discharge.  He reports  that he continue s to see his cardiology team in [place] usually about every 6 mo. He carries a diagnosis of HCM, that "has never given me problems". Dr. Wyona Almas his general cardiologist and Dr. Delsa Bern his EP. He has been told he may  eventually need a PPM but so far has not He reports wearing a 2 week monitor last year to his knowledge without any abnormal heart rhythms, noone has ever told him he has AFib, knows he has an abnormal EKG    Past Medical History:  Diagnosis Date   Arthritis    Cardiomyopathy (HCC)    CKD (chronic kidney disease)    Dyspnea    with exertion   Enlarged prostate    GERD (gastroesophageal reflux disease)    History of kidney stones    Hypertension    Sleep apnea    uses cpap     Surgical History:  Past Surgical History:  Procedure Laterality Date   CARDIAC CATHETERIZATION     "years ago" - states it was clear   COLONOSCOPY     KIDNEY STONE SURGERY     ORIF ELBOW FRACTURE Left 09/30/2018   Procedure: Open reduction internal fixation left elbow fracture with repair reconstruction and  allograft;  Surgeon: Dominica Severin, MD;  Location: [facility] OR;  Service: Orthopedics;  Laterality: Left;  90 mins   TONSILLECTOMY       Medications Prior to Admission  Medication Sig Dispense Refill Last Dose   acetaminophen (TYLENOL) 500 MG tablet Take 500 mg by mouth 2 (two) times daily as needed for moderate pain or headache.   Past Month at Unknown time   brimonidine (ALPHAGAN) 0.2 % ophthalmic solution Place 1 drop into both eyes in the morning and at bedtime.   04/03/2021 at Unknown time   dorzolamide-timolol (COSOPT) 22.3-6.8 MG/ML ophthalmic solution Place 1 drop into both eyes 2 (two) times daily.   04/03/2021 at Unknown time   lansoprazole (PREVACID) 30 MG capsule Take 30 mg by mouth daily.   04/03/2021 at Unknown time   latanoprost (XALATAN) 0.005 % ophthalmic solution Place 1 drop into both eyes at bedtime.  6 04/02/2021   Multiple Vitamin (MULTIVITAMIN WITH MINERALS) TABS tablet Take 1 tablet by mouth daily.   04/02/2021   perindopril (ACEON) 8 MG tablet Take 8 mg by mouth daily.   Past Week at Unknown time   Probiotic Product (PROBIOTIC DAILY PO) Take 1 capsule by mouth daily.   04/03/2021 at Unknown  time   RANEXA 1000 MG SR tablet Take 1,000 mg by mouth at bedtime.  3 04/03/2021 at Unknown time   sodium chloride (OCEAN) 0.65 % SOLN nasal spray Place 1 spray into both nostrils as needed for congestion.   unk   spironolactone (ALDACTONE) 25 MG tablet Take 25 mg by mouth every Monday, Wednesday, and Friday.   04/03/2021 at Unknown time   tamsulosin (FLOMAX) 0.4 MG CAPS capsule Take 0.8 mg by mouth every evening.   04/03/2021 at Unknown time    Inpatient Medications:   aspirin EC  81 mg Oral QPM   atorvastatin  40 mg Oral Daily   brimonidine  1 drop Both Eyes TID   clopidogrel  75 mg Oral Daily   dorzolamide-timolol  1 drop Both Eyes BID   enoxaparin (LOVENOX) injection  40 mg Subcutaneous Q24H   latanoprost  1 drop Both Eyes QHS   multivitamin with minerals  1 tablet Oral Daily   pantoprazole  40 mg Oral Daily  ranolazine  1,000 mg Oral Daily   tamsulosin  0.8 mg Oral QPM    Allergies: No Known Allergies  Social History   Socioeconomic History   Marital status: Married    Spouse name: Not on file   Number of children: Not on file   Years of education: Not on file   Highest education level: Not on file  Occupational History   Not on file  Tobacco Use   Smoking status: Never Smoker   Smokeless tobacco: Never Used  Vaping Use   Vaping Use: Never used  Substance and Sexual Activity   Alcohol use: No   Drug use: No   Sexual activity: Not on file  Other Topics Concern   Not on file  Social History Narrative   Not on file   Social Determinants of Health   Financial Resource Strain: Not on file  Food Insecurity: Not on file  Transportation Needs: Not on file  Physical Activity: Not on file  Stress: Not on file  Social Connections: Not on file  Intimate Partner Violence: Not on file     Family Hx:   Thyroid Disease Mother   Hypertension Mother   Kidney Disease Father   Diabetes Father   No Known Problems Sister   Heart Disease Brother   Crohn's Disease Brother    Tuberculosis Maternal Grandmother   No Known Problems Maternal Grandfather   Diabetes Paternal Grandmother   Diabetes Paternal Grandfather     Review of Systems: All other systems reviewed and are otherwise negative except as noted above.  Physical Exam: Vitals:   04/05/21 2009 04/05/21 2329 04/06/21 0314 04/06/21 0859  BP:  (!) 144/72 138/69 (!) 153/72  Pulse: 76 61 (!) 56 (!) 57  Resp: 15 18 18 20   Temp:  98.5 F (36.9 C) 98 F (36.7 C) 97.6 F (36.4 C)  TempSrc:  Oral Oral Oral  SpO2: 95% 96% 98% 100%  Weight:      Height:        GEN- The patient is well appearing, alert and oriented x 3 today.   Head- normocephalic, atraumatic Eyes-  Sclera clear, conjunctiva pink Ears- hearing intact Oropharynx- clear Neck- supple Lungs- CTA b/l, normal work of breathing Heart- RRR, no murmurs, rubs or gallops  GI- soft, NT, ND Extremities- no clubbing, cyanosis, or edema MS- no significant deformity or atrophy Skin- no rash or lesion Psych- euthymic mood, full affect   Labs:   Lab Results  Component Value Date   WBC 9.5 04/04/2021   HGB 16.7 04/04/2021   HCT 49.0 04/04/2021   MCV 90.9 04/04/2021   PLT 304 04/04/2021    Recent Labs  Lab 04/04/21 0030 04/04/21 0053  NA 137 141  K 4.5 4.0  CL 106 107  CO2 22  --   BUN 20 22  CREATININE 1.83* 1.80*  CALCIUM 9.4  --   PROT 7.1  --   BILITOT 0.8  --   ALKPHOS 41  --   ALT 15  --   AST 24  --   GLUCOSE 107* 107*   No results found for: CKTOTAL, CKMB, CKMBINDEX, TROPONINI Lab Results  Component Value Date   CHOL 187 04/04/2021   Lab Results  Component Value Date   HDL 40 (L) 04/04/2021   Lab Results  Component Value Date   LDLCALC 114 (H) 04/04/2021   Lab Results  Component Value Date   TRIG 167 (H) 04/04/2021   Lab Results  Component  Value Date   CHOLHDL 4.7 04/04/2021   No results found for: LDLDIRECT  No results found for: DDIMER   Radiology/Studies:   MR BRAIN WO CONTRAST Result Date:  04/04/2021 CLINICAL DATA:  Stroke follow-up EXAM: MRI HEAD WITHOUT CONTRAST TECHNIQUE: Multiplanar, multiecho pulse sequences of the brain and surrounding structures were obtained without intravenous contrast. COMPARISON:  CTA head neck 04/04/2021 FINDINGS: Brain: There is a small acute infarct of the anterior superior right cerebellum. No acute or chronic hemorrhage. Normal white matter signal, parenchymal volume and CSF spaces. The midline structures are normal. Vascular: Major flow voids are preserved. Skull and upper cervical spine: Normal calvarium and skull base. Visualized upper cervical spine and soft tissues are normal. Sinuses/Orbits:No paranasal sinus fluid levels or advanced mucosal thickening. No mastoid or middle ear effusion. Normal orbits. 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 No hemorrhage or mass effect. Electronically Signed   By: Deatra RobinsonKevin  Herman M.D.   On: 04/04/2021 02:32    CT HEAD CODE STROKE WO CONTRAST Result Date: 04/04/2021 CLINICAL DATA:  Code stroke.  Ataxia and dysarthria. EXAM: CT HEAD WITHOUT CONTRAST CT ANGIOGRAPHY OF THE HEAD AND NECK TECHNIQUE: Contiguous axial images were obtained from the base of the skull through the vertex without intravenous contrast. Multidetector CT imaging of the head and neck was performed using the standard protocol during bolus administration of intravenous contrast. Multiplanar CT image reconstructions and MIPs were obtained to evaluate the vascular anatomy. Carotid stenosis measurements (when applicable) are obtained utilizing NASCET criteria, using the distal internal carotid diameter as the denominator. CONTRAST:  75mL OMNIPAQUE IOHEXOL 350 MG/ML SOLN COMPARISON:  None. FINDINGS: CT HEAD FINDINGS Brain: There is no mass, hemorrhage or extra-axial collection. The size and configuration of the ventricles and extra-axial CSF spaces are normal. The brain parenchyma is normal, without evidence of acute or chronic  infarction. Vascular: No abnormal hyperdensity of the major intracranial arteries or dural venous sinuses. No intracranial atherosclerosis. Skull: The visualized skull base, calvarium and extracranial soft tissues are normal. Sinuses/Orbits: No fluid levels or advanced mucosal thickening of the visualized paranasal sinuses. No mastoid or middle ear effusion. The orbits are normal. ASPECTS (Alberta Stroke Program Early CT Score) - Ganglionic level infarction (caudate, lentiform nuclei, internal capsule, insula, M1-M3 cortex): 7 - Supraganglionic infarction (M4-M6 cortex): 3 Total score (0-10 with 10 being normal): 10 CTA NECK FINDINGS SKELETON: There is no bony spinal canal stenosis. No lytic or blastic lesion. OTHER NECK: Normal pharynx, larynx and major salivary glands. No cervical lymphadenopathy. Unremarkable thyroid gland. UPPER CHEST: No pneumothorax or pleural effusion. No nodules or masses. AORTIC ARCH: There is no calcific atherosclerosis of the aortic arch. There is no aneurysm, dissection or hemodynamically significant stenosis of the visualized portion of the aorta. Conventional 3 vessel aortic branching pattern. The visualized proximal subclavian arteries are widely patent. RIGHT CAROTID SYSTEM: Normal without aneurysm, dissection or stenosis. LEFT CAROTID SYSTEM: Normal without aneurysm, dissection or stenosis. VERTEBRAL ARTERIES: Left dominant configuration. Both origins are clearly patent. There is no dissection, occlusion or flow-limiting stenosis to the skull base (V1-V3 segments). CTA HEAD FINDINGS POSTERIOR CIRCULATION: --Vertebral arteries: Normal V4 segments. --Inferior cerebellar arteries: Normal. --Basilar artery: Normal. --Superior cerebellar arteries: Normal. --Posterior cerebral arteries (PCA): Mild distal left PCA narrowing. Normal right. ANTERIOR CIRCULATION: --Intracranial internal carotid arteries: Normal. --Anterior cerebral arteries (ACA): Normal. Both A1 segments are present. Patent  anterior communicating artery (a-comm). --Middle cerebral arteries (MCA): Normal. VENOUS SINUSES: As permitted by contrast timing, patent.  ANATOMIC VARIANTS: Hypoplastic left ACA A1 segment, a common variant. Review of the MIP images confirms the above findings. IMPRESSION: 1. No intracranial hemorrhage.  ASPECTS is 10. 2. No emergent large vessel occlusion or hemodynamically significant stenosis by NASCET criteria. 3. Normal carotid and vertebral arteries. These results were called by telephone at the time of interpretation on 04/04/2021 at 1:14 am to provider [facility] , who verbally acknowledged these results. Electronically Signed   By: Deatra Robinson M.D.   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 Cryptogenic stroke The patient presents with cryptogenic stroke.  I spoke at length with the patient about monitoring for afib with either a 30 day event monitor or an implantable loop recorder.  Risks, benefits, and alteratives to implantable loop recorder were discussed with the patient today.     Dr. Lalla Brothers has seen and examined the patient.  The patient would like [place] to p[roceed with loop implant and monitoring until he can get back to [place] team and perhaps will then transfer monitoring to them at that time.  I will arrange wound check visit, wound care was discussed with the patient and wie.  Please call with questions.   Renee Norberto Sorenson, PA-C 04/06/2021  EP Attending  Patient seen and examined. Agree with the findings as noted above. The patient has had a cryptogenic stroke. I have discussed the possible causes including PAF and the indication for ILR insertion. The indications/risk/benfits of all ot he above were reviewed and he wishes to proceed with ILR insertion.  Sharlot Gowda TaylorMD

## 2021-04-06 NOTE — Discharge Instructions (Signed)
Post procedure wound care instructions (heart monitor implant site) Keep incision clean and dry for 3 days.  You can remove outer dressing tomorrow. Leave steri-strips (little pieces of tape) on until seen in the office for wound check appointment. Call the office ([phone]) for redness, drainage, swelling, or fever.  

## 2021-04-06 NOTE — H&P (Signed)
Physical Medicine and Rehabilitation Admission H&P    Chief Complaint  Patient presents with  . Dizziness  : HPI: Bryan IsaacsDonald E. Crane Bryan Crane is a 75 year old right-handed male with history of cardiomyopathy, CKD stage III, hypertension, sleep apnea.  Per chart review lives with spouse.  1 level home 3 steps to entry.  Reportedly independent prior to admission and active.  Presented 04/03/2021 with dizziness slurred speech unsteady gait of acute onset.  Cranial CT scan as well as CTA head and neck showed no intracranial hemorrhage.  No emergent large vessel occlusion or hemodynamically significant stenosis.  Patient did not receive tPA.  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  No hemorrhage or mass-effect.  Echocardiogram with ejection fraction of 60 to 65% no evidence of mitral stenosis.  The left ventricle had no regional wall motion abnormalities.  Admission chemistries unremarkable except glucose 107 creatinine 1.83 urine drug screen negative.  Currently maintained on aspirin 81 mg daily and Plavix 75 mg daily for CVA prophylaxis x3 weeks and aspirin alone.  Subcutaneous Lovenox for DVT prophylaxis.  Awaiting plan for loop recorder placement.  Therapy evaluations completed due to patient's decreased functional ability gait abnormality was admitted for a comprehensive rehab program.  Review of Systems  Constitutional: Negative for chills and fever.  HENT: Negative for hearing loss.   Eyes: Positive for blurred vision. Negative for double vision.  Respiratory: Negative for cough.        Dyspnea on exertion  Cardiovascular: Positive for leg swelling. Negative for chest pain.  Gastrointestinal: Positive for constipation. Negative for heartburn.       GERD  Genitourinary: Positive for urgency. Negative for dysuria and flank pain.  Musculoskeletal: Positive for myalgias.  Skin: Negative for rash.  Neurological: Positive for dizziness, speech change and weakness.  All  other systems reviewed and are negative.  Past Medical History:  Diagnosis Date  . Arthritis   . Cardiomyopathy (HCC)   . CKD (chronic kidney disease)   . Dyspnea    with exertion  . Enlarged prostate   . GERD (gastroesophageal reflux disease)   . History of kidney stones   . Hypertension   . Sleep apnea    uses cpap   Past Surgical History:  Procedure Laterality Date  . CARDIAC CATHETERIZATION     "years ago" - states it was clear  . COLONOSCOPY    . KIDNEY STONE SURGERY    . ORIF ELBOW FRACTURE Left 09/30/2018   Procedure: Open reduction internal fixation left elbow fracture with repair reconstruction and  allograft;  Surgeon: Dominica SeverinGramig, William, MD;  [facility];  Service: Orthopedics;  Laterality: Left;  90 mins  . TONSILLECTOMY     History reviewed. No pertinent family history. Social History:  reports that he has never smoked. He has never used smokeless tobacco. He reports that he does not drink alcohol and does not use drugs. Allergies: No Known Allergies Medications Prior to Admission  Medication Sig Dispense Refill  . acetaminophen (TYLENOL) 500 MG tablet Take 500 mg by mouth 2 (two) times daily as needed for moderate pain or headache.    . brimonidine (ALPHAGAN) 0.2 % ophthalmic solution Place 1 drop into both eyes in the morning and at bedtime.    . dorzolamide-timolol (COSOPT) 22.3-6.8 MG/ML ophthalmic solution Place 1 drop into both eyes 2 (two) times daily.    . lansoprazole (PREVACID) 30 MG capsule Take 30 mg by mouth daily.    Marland Kitchen. latanoprost (  XALATAN) 0.005 % ophthalmic solution Place 1 drop into both eyes at bedtime.  6  . Multiple Vitamin (MULTIVITAMIN WITH MINERALS) TABS tablet Take 1 tablet by mouth daily.    . perindopril (ACEON) 8 MG tablet Take 8 mg by mouth daily.    . Probiotic Product (PROBIOTIC DAILY PO) Take 1 capsule by mouth daily.    Marland Kitchen RANEXA 1000 MG SR tablet Take 1,000 mg by mouth at bedtime.  3  . sodium chloride (OCEAN) 0.65 % SOLN nasal  spray Place 1 spray into both nostrils as needed for congestion.    Marland Kitchen spironolactone (ALDACTONE) 25 MG tablet Take 25 mg by mouth every Monday, Wednesday, and Friday.    . tamsulosin (FLOMAX) 0.4 MG CAPS capsule Take 0.8 mg by mouth every evening.      Drug Regimen Review Drug regimen was reviewed and remains appropriate with no significant issues identified  Home: Home Living Family/patient expects to be discharged to:: Private residence Living Arrangements: Spouse/significant other Available Help at Discharge: Family,Available 24 hours/day Type of Home: House Home Access: Stairs to enter [employer] of Steps: 3 in front. No steps in back Entrance Stairs-Rails: Right,Left,Can reach both Home Layout: One level Bathroom Shower/Tub: Engineer, manufacturing systems: Handicapped height Bathroom Accessibility: Yes Home Equipment: Medical laboratory scientific officer - single point  Lives With: Spouse   Functional History: Prior Function Level of Independence: Independent Comments: independent with all activity, walking ~1 hour at a time and biking at house for activity  Functional Status:  Mobility: Bed Mobility Overal bed mobility: Needs Assistance Bed Mobility: Supine to Sit Supine to sit: Supervision Sit to supine: Min assist General bed mobility comments: minA to transition to sitting EOB with min-modA to steady once in sitting position. pt with posterior lean, needing cues to maintain trunk forward and feet on floor Transfers Overall transfer level: Needs assistance Equipment used: Rolling walker (2 wheeled),1 person hand held assist Transfers: Sit to/from [employer] Sit to Stand: Min guard Stand pivot transfers: Min assist General transfer comment: minA for stability with LOB x1 at commode Ambulation/Gait Ambulation/Gait assistance: Min assist,Mod assist Gait Distance (Feet): 45 Feet Assistive device: Rolling walker (2 wheeled) Gait Pattern/deviations: Step-through  pattern,Decreased stride length,Decreased weight shift to right,Decreased stance time - right,Ataxic,Staggering left General Gait Details: pt with multiple LOB to L requiring modA to steady and recover. difficulty maintaining RW on ground at times without assist. inconsistent step lengths but generally short with decerased wt shift to RLE Gait velocity: decreased Gait velocity interpretation: <1.31 ft/sec, indicative of household ambulator    ADL: ADL Overall ADL's : Needs assistance/impaired Eating/Feeding: Set up,Sitting Grooming: Min guard,Standing Upper Body Bathing: Min guard,Standing Lower Body Bathing: Minimal assistance,Sitting/lateral leans,Sit to/from stand Upper Body Dressing : Min guard,Standing Lower Body Dressing: Minimal assistance,Sitting/lateral leans,Sit to/from stand,Cueing for safety Toilet Transfer: Minimal assistance,Cueing for safety,Cueing for sequencing,Ambulation,Regular Toilet,RW,Grab bars Toilet Transfer Details (indicate cue type and reason): cues for hand placement Toileting- Clothing Manipulation and Hygiene: Minimal assistance,Cueing for safety,Sitting/lateral lean,Sit to/from stand Toileting - Clothing Manipulation Details (indicate cue type and reason): LOB x1 in standing Functional mobility during ADLs: Minimal assistance,Rolling walker,Cueing for safety,Cueing for sequencing General ADL Comments: Pt requiring cues for mobility with RW and safety. Pt performing bed mobility with supervisionA and mobility iwth minguardA to minA overall with RW.  Pt stepping around bed to recliner with no AD, but use of furniture for stability. Pt appears strong and with minimal balance deficits, but could benefit from higher level ADL and  challenging balance tasks.  Cognition: Cognition Overall Cognitive Status: Within Functional Limits for tasks assessed Arousal/Alertness: Awake/alert Orientation Level: Oriented X4 Attention: Focused,Alternating Focused Attention:  Impaired Focused Attention Impairment: Verbal complex,Functional complex Alternating Attention: Impaired Alternating Attention Impairment: Verbal complex,Functional complex Memory: Impaired Memory Impairment: Decreased recall of new information Problem Solving: Impaired Problem Solving Impairment: Verbal complex,Functional complex Executive Function: Organizing,Self Monitoring Organizing: Impaired Organizing Impairment: Verbal complex,Functional complex Self Monitoring: Impaired Self Monitoring Impairment: Verbal complex,Functional complex Safety/Judgment: Impaired Comments: per PT notes Cognition Arousal/Alertness: Awake/alert Behavior During Therapy: WFL for tasks assessed/performed Overall Cognitive Status: Within Functional Limits for tasks assessed Area of Impairment: Safety/judgement,Problem solving,Awareness Safety/Judgement: Decreased awareness of safety,Decreased awareness of deficits Awareness: Intellectual Problem Solving: Decreased initiation,Difficulty sequencing,Requires verbal cues General Comments: Pt A/O x4 with cues. Pt following all commands and aware that he is moving better than a few days ago. Pt scored a perfect score on Short Blessed Test revealing no memory deficit and able to perform task while ambulating in a distracting environment.  Physical Exam: Blood pressure (!) 153/72, pulse (!) 57, temperature 97.6 F (36.4 C), temperature source Oral, resp. rate 20, height 5\' 6"  (1.676 m), weight 88.5 kg, SpO2 100 %. Physical Exam Constitutional:      General: He is not in acute distress.    Appearance: Normal appearance. He is not ill-appearing.  HENT:     Head: Normocephalic and atraumatic.     Nose: Nose normal.     Mouth/Throat:     Mouth: Mucous membranes are moist.  Eyes:     Pupils: Pupils are equal, round, and reactive to light.     Comments: Vision limited OS  Cardiovascular:     Rate and Rhythm: Regular rhythm. Bradycardia present.     Heart  sounds: No murmur heard. No gallop.   Pulmonary:     Effort: Pulmonary effort is normal. No respiratory distress.     Breath sounds: No wheezing.  Abdominal:     General: Bowel sounds are normal. There is no distension.     Tenderness: There is no abdominal tenderness. There is no rebound.  Genitourinary:    Penis: Normal.   Musculoskeletal:        General: No swelling or tenderness. Normal range of motion.     Cervical back: Normal range of motion and neck supple.  Skin:    General: Skin is warm and dry.  Neurological:     Comments: Patient is alert in no acute distress.  Makes eye contact with examiner.  Provides name and age.  Follows simple commands. Mild dysarthria.  Fair insight and awareness. Mild right limb ataxia with past-pointing. Strength 4/5 RUE and RLE and 4+ LUE and LLE. No sensory changes. DTR's 1+     No results found for this or any previous visit (from the past 48 hour(s)). No results found.     Medical Problem List and Plan: 1.  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  No hemorrhage or mass-effect.  Status post loop recorder.  -patient may  shower  -ELOS/Goals: 7-10 days, mod I to supervision with PT and OT and mod I with SLP. 2.  Antithrombotics: -DVT/anticoagulation: Subcutaneous Lovenox  -antiplatelet therapy: Aspirin 81 mg daily and Plavix 75 mg daily x3 weeks then aspirin alone 3. Pain Management: Tylenol as needed 4. Mood: Provide emotional support  -antipsychotic agents: N/A 5. Neuropsych: This patient is capable of making decisions on his own  behalf. 6. Skin/Wound Care: Routine skin checks 7. Fluids/Electrolytes/Nutrition: Routine in and outs with follow-up chemistries on admit 8.  Hyperlipidemia.  Lipitor 9.  CKD stage III.  Admission creatinine 1.83.  Follow-up chemistries on admit tomorrow 10.  BPH.  Flomax 0.8 mg daily.  Check PVR 11.  History of  cardiomyopathy.  Continue Ranexa 1000 mg daily.  No chest pain or shortness of breath  -check weights weekly 12.  OSA.  CPAP.      Mcarthur Rossetti Angiulli, PA-C 04/06/2021

## 2021-04-06 NOTE — Progress Notes (Signed)
Inpatient Rehabilitation Medication Review by a Pharmacist  A complete drug regimen review was completed for this patient to identify any potential clinically significant medication issues.  Clinically significant medication issues were identified:  no  Check AMION for pharmacist assigned to patient if future medication questions/issues arise during this admission.  Pharmacist comments:  None  Time spent performing this drug regimen review (minutes):  10 minutes   Elwin Sleight 04/06/2021 7:03 PM

## 2021-04-06 NOTE — Plan of Care (Signed)
  Problem: Education: Goal: Knowledge of secondary prevention will improve Outcome: Progressing Goal: Knowledge of patient specific risk factors addressed and post discharge goals established will improve Outcome: Progressing   Problem: Coping: Goal: Will identify appropriate support needs Outcome: Progressing   Problem: Nutrition: Goal: Dietary intake will improve Outcome: Progressing

## 2021-04-07 ENCOUNTER — Encounter (HOSPITAL_COMMUNITY): Payer: Self-pay | Admitting: Physical Medicine and Rehabilitation

## 2021-04-07 LAB — COMPREHENSIVE METABOLIC PANEL
ALT: 16 U/L (ref 0–44)
AST: 17 U/L (ref 15–41)
Albumin: 3.1 g/dL — ABNORMAL LOW (ref 3.5–5.0)
Alkaline Phosphatase: 36 U/L — ABNORMAL LOW (ref 38–126)
Anion gap: 8 (ref 5–15)
BUN: 26 mg/dL — ABNORMAL HIGH (ref 8–23)
CO2: 24 mmol/L (ref 22–32)
Calcium: 8.8 mg/dL — ABNORMAL LOW (ref 8.9–10.3)
Chloride: 103 mmol/L (ref 98–111)
Creatinine, Ser: 1.91 mg/dL — ABNORMAL HIGH (ref 0.61–1.24)
GFR, Estimated: 36 mL/min — ABNORMAL LOW (ref >60)
Glucose, Bld: 105 mg/dL — ABNORMAL HIGH (ref 70–99)
Potassium: 4 mmol/L (ref 3.5–5.1)
Sodium: 135 mmol/L (ref 135–145)
Total Bilirubin: 0.8 mg/dL (ref 0.3–1.2)
Total Protein: 6.2 g/dL — ABNORMAL LOW (ref 6.5–8.1)

## 2021-04-07 LAB — CBC WITH DIFFERENTIAL/PLATELET
Abs Immature Granulocytes: 0.04 10*3/uL (ref 0.00–0.07)
Basophils Absolute: 0 10*3/uL (ref 0.0–0.1)
Basophils Relative: 0 %
Eosinophils Absolute: 0.2 10*3/uL (ref 0.0–0.5)
Eosinophils Relative: 3 %
HCT: 41.9 % (ref 39.0–52.0)
Hemoglobin: 14.5 g/dL (ref 13.0–17.0)
Immature Granulocytes: 0 %
Lymphocytes Relative: 23 %
Lymphs Abs: 2.2 10*3/uL (ref 0.7–4.0)
MCH: 31.3 pg (ref 26.0–34.0)
MCHC: 34.6 g/dL (ref 30.0–36.0)
MCV: 90.5 fL (ref 80.0–100.0)
Monocytes Absolute: 0.9 10*3/uL (ref 0.1–1.0)
Monocytes Relative: 10 %
Neutro Abs: 6.1 10*3/uL (ref 1.7–7.7)
Neutrophils Relative %: 64 %
Platelets: 232 10*3/uL (ref 150–400)
RBC: 4.63 MIL/uL (ref 4.22–5.81)
RDW: 12.6 % (ref 11.5–15.5)
WBC: 9.6 10*3/uL (ref 4.0–10.5)
nRBC: 0 % (ref 0.0–0.2)

## 2021-04-07 LAB — HEMOGLOBIN A1C
Hgb A1c MFr Bld: 5.9 % — ABNORMAL HIGH (ref 4.8–5.6)
Mean Plasma Glucose: 123 mg/dL

## 2021-04-07 MED FILL — Lidocaine Inj 1% w/ Epinephrine-1:100000: INTRAMUSCULAR | Qty: 20 | Status: AC

## 2021-04-07 NOTE — Progress Notes (Signed)
Patient placed himself on home CPAP unit for the night.  

## 2021-04-07 NOTE — Progress Notes (Signed)
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 Arthritis   . Cardiomyopathy (HCC)   . CKD (chronic kidney disease)   . Dyspnea    with exertion  . Enlarged prostate   . GERD (gastroesophageal reflux disease)   . History of kidney stones   . Hypertension   . Sleep apnea    uses cpap   Past Surgical History:  Past Surgical History:  Procedure Laterality Date  . CARDIAC CATHETERIZATION     "years ago" - states it was clear  . COLONOSCOPY    . KIDNEY STONE SURGERY    . LOOP RECORDER INSERTION N/A 04/06/2021   Procedure: LOOP RECORDER INSERTION;  Surgeon: Marinus Maw, MD;  Location: [facility] INVASIVE CV LAB;  Service: Cardiovascular;  Laterality: N/A;  . ORIF ELBOW FRACTURE Left 09/30/2018   Procedure: Open reduction internal fixation left elbow fracture with repair reconstruction and  allograft;  Surgeon: Dominica Severin, MD;  [facility] OR;  Service: Orthopedics;  Laterality: Left;  90 mins  . TONSILLECTOMY     Social History:  reports that he has never smoked. He has never used smokeless tobacco. He reports that he does not drink alcohol and does not use drugs.  Family / Support Systems Marital Status: Married Patient Roles: Spouse Spouse/Significant Other: [place] wife [phone]  [phone] Other Supports: Friends and church members Anticipated Caregiver: wife Ability/Limitations of Caregiver: No limitations in good health Caregiver Availability: 24/7 Family Dynamics: Close with family and church members are supportive and will check on at home  Social History Preferred language: English Religion:  Cultural Background: No issues Education: HS Read: Yes Write:  Yes Employment Status: Retired Marine scientist Issues: No issues Guardian/Conservator: none-according to MD pt is capable of making his own decisions while here. Wife is staying here with him   Abuse/Neglect Abuse/Neglect Assessment Can Be Completed: Yes Physical Abuse: Denies Verbal Abuse: Denies Sexual Abuse: Denies Exploitation of patient/patient's resources: Denies Self-Neglect: Denies  Emotional Status Pt's affect, behavior and adjustment status: Pt is motivated to do well and feels he has made progress already. He is moving well and recovering from this CVA. He feels it is with much prayers from family and friends. His plan is to be mod/i at [place] Recent Psychosocial Issues: other health issues thought were being managed prior to this Psychiatric History: No history deferred depression screen due to coping appropriately and verbalizing his feelings. He will be a short length of stay her due to high level Substance Abuse History: No issues  Patient / Family Perceptions, Expectations & Goals Pt/Family understanding of illness & functional limitations: Pt and wife can explain his stroke and deficits. He is progressing and doing well and feels will not be here long. Talks with MD daily as he rounds Premorbid pt/family roles/activities: husband, church member, friend, neighbor, etc Anticipated changes in roles/activities/participation: resume Pt/family expectations/goals: Pt states: " I want to be able to take care of myself before I leave here."  Wife states: " I will help but know how independent he is."  [employer]: None Premorbid Home Care/DME Agencies: Other (Comment) (cane at home) Transportation available at discharge: Pt drove PTA and wife drives  Discharge Planning Living Arrangements: Spouse/significant other Support Systems: Friends/neighbors,Church/faith  community,Spouse/significant other Type of Residence: Private  residence Insurance Resources: [employer] [facility] (specify) Administrator) Financial Resources: Education officer, museum Screen Referred: No Living Expenses: Own Money Management: Patient,Spouse Does the patient have any problems obtaining your medications?: No Home Management: Both Patient/Family Preliminary Plans: Return home with his wife who is in good health and can assist if needed. She is currently staying here with him while he is here. Aware being evaluated today and setting goals, high level will not be here long. Care Coordinator Anticipated Follow Up Needs: HH/OP  Clinical Impression Pleasant gentleman who is motivated and doing well recovering from his stroke. His wife is staying here with him and can assist. Should not be here long due to high level will await evaluations and work on discharge needs.  Lucy Chris 04/07/2021, 10:31 AM

## 2021-04-07 NOTE — Evaluation (Signed)
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 Arthritis   . Cardiomyopathy ([place])   . CKD (chronic kidney disease)   . Dyspnea    with exertion  . Enlarged prostate   . GERD (gastroesophageal reflux disease)   . History of kidney stones   . Hypertension   . Sleep apnea    uses cpap   Past Surgical History:  Past Surgical History:  Procedure Laterality Date  . CARDIAC CATHETERIZATION     "years ago" - states it was clear  . COLONOSCOPY    . KIDNEY STONE SURGERY    . LOOP RECORDER INSERTION N/A 04/06/2021   Procedure: LOOP RECORDER INSERTION;  Surgeon: Evans Lance, MD;  Location: [facility];  Service: Cardiovascular;  Laterality: N/A;  . ORIF ELBOW FRACTURE Left 09/30/2018   Procedure: Open reduction internal fixation left elbow fracture with repair reconstruction and  allograft;  Surgeon: Roseanne Kaufman, MD;  Location: [facility];  Service: Orthopedics;  Laterality: Left;  90 mins  . TONSILLECTOMY      Assessment & Plan Clinical Impression: Patient is a 75 year old right-handed male with history of cardiomyopathy, CKD stage III, hypertension, sleep apnea. Per chart review lives with spouse. 1 level home 3 steps to entry. Reportedly independent prior to admission and active. Presented 04/03/2021 with dizziness slurred speech unsteady gait of acute onset. Cranial CT scan as well as CTA head and neck showed no intracranial hemorrhage. No emergent large vessel occlusion or hemodynamically significant stenosis. Patient did not  receive tPA. 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 No hemorrhage or mass-effect. Echocardiogram with ejection fraction of 60 to 65% no evidence of mitral stenosis. The left ventricle had no regional wall motion abnormalities. Admission chemistries unremarkable except glucose 107 creatinine 1.83 urine drug screen negative. Currently maintained on aspirin 81 mg daily and Plavix 75 mg daily for CVA prophylaxis x3 weeks and aspirin alone. Subcutaneous Lovenox for DVT prophylaxis. Awaiting plan for loop recorder placement. Therapy evaluations completed due to patient's decreased functional ability gait abnormality was admitted for a comprehensive rehab program. Patient transferred to [facility] on 04/06/2021 .   Patient currently requires CGA with mobility secondary to muscle weakness, ataxia and decreased standing balance and decreased balance strategies.  Prior to hospitalization, patient was independent  with mobility and lived with Spouse in a House home.  Home access is 3 in front. No steps in backStairs to enter.  Patient will benefit from skilled PT intervention to maximize safe functional mobility, minimize fall risk and decrease caregiver burden for planned discharge home with intermittent assist.  Anticipate patient will benefit from follow up OP at discharge.  PT - End of Session Activity Tolerance: Tolerates 30+ min activity with multiple rests Endurance Deficit: Yes Endurance Deficit Description: mild endurance impairment; pt was very active PLOF walking x 1 hr each day; endurance likely impacted 2/2 limited activity since hospitalization PT Assessment Rehab Potential (ACUTE/IP ONLY): Excellent PT Barriers to Discharge: Insurance for SNF coverage PT Patient demonstrates impairments in the following area(s): Balance;Endurance;Motor PT Transfers Functional  Problem(s): Bed Mobility;Bed to Chair;Car PT Locomotion Functional Problem(s): Ambulation PT Plan PT  Intensity: Minimum of 1-2 x/day ,45 to 90 minutes PT Frequency: 5 out of 7 days PT Duration Estimated Length of Stay: 5 days PT Treatment/Interventions: Discharge planning;Ambulation/gait training;Functional mobility training;Psychosocial support;Visual/perceptual remediation/compensation;Therapeutic Activities;Therapeutic Exercise;Wheelchair propulsion/positioning;Skin care/wound management;Neuromuscular re-education;Disease management/prevention;Balance/vestibular training;Cognitive remediation/compensation;DME/adaptive equipment instruction;Pain management;Splinting/orthotics;UE/LE Strength taining/ROM;Stair training;UE/LE Coordination activities;Patient/family education;Community reintegration PT Transfers Anticipated Outcome(s): mod I with LRAD PT Locomotion Anticipated Outcome(s): mod I with LRAD PT Recommendation Follow Up Recommendations: Outpatient PT;24 hour supervision/assistance Patient destination: Home Equipment Recommended: To be determined Equipment Details: Pt owns canes   PT Evaluation Precautions/Restrictions Precautions Precautions: Fall Precaution Comments: loop recorder placement-keep site dry Restrictions Weight Bearing Restrictions: No General   Vital Signs Pain Pain Assessment Pain Scale: 0-10 Pain Score: 0-No pain Home Living/Prior Functioning Home Living Living Arrangements: Spouse/significant other Available Help at Discharge: Family;Available 24 hours/day Type of Home: House Home Access: Stairs to enter [employer] of Steps: 3 in front. No steps in back Entrance Stairs-Rails: Right;Left;Can reach both Home Layout: One level Bathroom Shower/Tub: Chiropodist: Handicapped height Bathroom Accessibility: Yes  Lives With: Spouse Prior Function Level of Independence: Independent with gait;Independent with transfers;Independent with homemaking with ambulation  Able to Take Stairs?: Yes Driving: Yes Vocation:  Retired Comments: independent with all activity, walking ~1 hour at a time and biking at house for activity Vision/Perception  Vision - Assessment Eye Alignment: Within Functional Limits Ocular Range of Motion: Within Functional Limits Alignment/Gaze Preference: Within Defined Limits Tracking/Visual Pursuits: Able to track stimulus in all quads without difficulty Perception Perception: Within Functional Limits Praxis Praxis: Intact  Cognition Overall Cognitive Status: Impaired/Different from baseline Arousal/Alertness: Awake/alert Orientation Level: Oriented X4 Attention: Focused;Alternating Focused Attention: Appears intact Memory: Appears intact Immediate Memory Recall: Sock;Blue;Bed Memory Recall Sock: Without Cue Memory Recall Blue: Without Cue Memory Recall Bed: Without Cue Awareness: Appears intact Problem Solving: Appears intact Organizing: Appears intact Self Monitoring: Appears intact Safety/Judgment: Appears intact Sensation Sensation Light Touch: Appears Intact Hot/Cold: Appears Intact Proprioception: Appears Intact Stereognosis: Appears Intact Coordination Gross Motor Movements are Fluid and Coordinated: No Fine Motor Movements are Fluid and Coordinated: Yes Finger Nose Finger Test: WNL BUE Heel Shin Test: WNL Motor  Motor Motor: Within Functional Limits   Trunk/Postural Assessment  Cervical Assessment Cervical Assessment: Within Functional Limits Thoracic Assessment Thoracic Assessment: Within Functional Limits Lumbar Assessment Lumbar Assessment: Within Functional Limits Postural Control Postural Control: Within Functional Limits  Balance Balance Balance Assessed: Yes Standardized Balance Assessment Standardized Balance Assessment: Berg Balance Test Berg Balance Test Sit to Stand: Able to stand without using hands and stabilize independently Standing Unsupported: Able to stand safely 2 minutes Sitting with Back Unsupported but Feet Supported  on Floor or Stool: Able to sit safely and securely 2 minutes Stand to Sit: Sits safely with minimal use of hands Transfers: Able to transfer safely, minor use of hands Standing Unsupported with Eyes Closed: Able to stand 10 seconds safely Standing Ubsupported with Feet Together: Able to place feet together independently and stand for 1 minute with supervision From Standing, Reach Forward with Outstretched Arm: Can reach confidently >25 cm (10") From Standing Position, Pick up Object from Floor: Able to pick up shoe, needs supervision From Standing Position, Turn to Look Behind Over each Shoulder: Looks behind from both sides and weight shifts well Turn 360 Degrees: Able to turn 360 degrees safely one side only in 4 seconds or less Standing Unsupported, Alternately Place Feet on Step/Stool:  Able to complete >2 steps/needs minimal assist Standing Unsupported, One Foot in Front: Able to place foot tandem independently and hold 30 seconds Standing on One Leg: Able to lift leg independently and hold 5-10 seconds Total Score: 49  Static Sitting Balance Static Sitting - Balance Support: No upper extremity supported Static Sitting - Level of Assistance: 7: Independent Dynamic Sitting Balance Dynamic Sitting - Balance Support: During functional activity Dynamic Sitting - Level of Assistance: 5: Stand by assistance Dynamic Sitting - Balance Activities: Forward lean/weight shifting Static Standing Balance Static Standing - Balance Support: No upper extremity supported Static Standing - Level of Assistance: 5: Stand by assistance Dynamic Standing Balance Dynamic Standing - Balance Support: No upper extremity supported Dynamic Standing - Level of Assistance: 5: Stand by assistance Dynamic Standing - Balance Activities: Forward lean/weight shifting Extremity Assessment  RUE Assessment RUE Assessment: Within Functional Limits LUE Assessment LUE Assessment:  (Fractured left elbow a few years ago per  pt report, baseline limited ROM flexion/extension but WFL.) RLE Assessment RLE Assessment: Within Functional Limits General Strength Comments: Grossly 4+/5 LLE Assessment LLE Assessment: Within Functional Limits General Strength Comments: Grossly 4+/5  Care Tool Care Tool Bed Mobility Roll left and right activity   Roll left and right assist level: Supervision/Verbal cueing    Sit to lying activity   Sit to lying assist level: Supervision/Verbal cueing    Lying to sitting edge of bed activity   Lying to sitting edge of bed assist level: Supervision/Verbal cueing     Care Tool Transfers Sit to stand transfer   Sit to stand assist level: Contact Guard/Touching assist    Chair/bed transfer   Chair/bed transfer assist level: Contact Guard/Touching assist     Toilet transfer   Assist Level: Contact Guard/Touching assist    Car transfer   Car transfer assist level: Contact Guard/Touching assist      Care Tool Locomotion Ambulation   Assist level: Contact Guard/Touching assist Assistive device: No Device Max distance: 24f  Walk 10 feet activity   Assist level: Contact Guard/Touching assist Assistive device: No Device   Walk 50 feet with 2 turns activity   Assist level: Contact Guard/Touching assist    Walk 150 feet activity   Assist level: Contact Guard/Touching assist Assistive device: No Device  Walk 10 feet on uneven surfaces activity   Assist level: Contact Guard/Touching assist    Stairs   Assist level: Contact Guard/Touching assist Stairs assistive device: 2 hand rails Max number of stairs: 12  Walk up/down 1 step activity   Walk up/down 1 step (curb) assist level: Minimal Assistance - Patient > 75% Walk up/down 1 step or curb assistive device: No device    Walk up/down 4 steps activity Walk up/down 4 steps assist level: Contact Guard/Touching assist Walk up/down 4 steps assistive device: 2 hand rails  Walk up/down 12 steps activity   Walk up/down 12  steps assist level: Contact Guard/Touching assist Walk up/down 12 steps assistive device: 2 hand rails  Pick up small objects from floor   Pick up small object from the floor assist level: Contact Guard/Touching assist    Wheelchair Will patient use wheelchair at discharge?: No   Wheelchair activity did not occur: N/A      Wheel 50 feet with 2 turns activity Wheelchair 50 feet with 2 turns activity did not occur: N/A    Wheel 150 feet activity Wheelchair 150 feet activity did not occur: N/A      Refer to Care Plan  for Long Term Goals  SHORT TERM GOAL WEEK 1 PT Short Term Goal 1 (Week 1): STG = LTG due to ELOS  Recommendations for other services: None   Skilled Therapeutic Intervention Mobility Bed Mobility Bed Mobility: Supine to Sit;Sit to Supine Right Sidelying to Sit: Independent Supine to Sit: Independent Sit to Supine: Independent Transfers Transfers: Sit to Stand;Stand to Sit;Stand Pivot Transfers Sit to Stand: Supervision/Verbal cueing Stand to Sit: Supervision/Verbal cueing Stand Pivot Transfers: Contact Guard/Touching assist Transfer (Assistive device): None Locomotion  Gait Ambulation: Yes Gait Assistance: Contact Guard/Touching assist Gait Distance (Feet): 200 Feet Assistive device: None Gait Gait: Yes Gait Pattern: Impaired (Reciprocal stepping with mild truncal ataxia. Increased unsteadiness with quick turns. No overt LOB or knee buckling but unsteadiness present) Gait velocity: Decreased Stairs / Additional Locomotion Stairs: Yes Stairs Assistance: Contact Guard/Touching assist Stair Management Technique: Two rails Number of Stairs: 12 Height of Stairs: 6 Ramp: Contact Guard/touching assist Curb: Minimal Assistance - Patient >75% Wheelchair Mobility Wheelchair Mobility: No  Skilled Intervention Pt received sitting EOB at start of session, wife at bedside. Pt agreeable to PT evaluation. Initiated functional mobility as outlined above. Pt is very  pleasant and motivated to participate. Able to follow simple commands appropriately with strength grossly symmetrical throughout (5/5). Completes sit<>stand transfers with CGA and no AD, ambulates >230f with CGA and no AD, and able to complete car transfers with CGA as well. Stairs completed where he navigated 12 steps with CGA and 2 hand rails. Also completed BERG balance test (as outlined above) where he scored 49/56, indicating increased falls risk. Pt is mostly impacted by dynamic balance deficits however anticipate that due to his active PLOF and social factors, he will excel in his stroke recovery. Pt ended session on toilet and reporting need to have a BM - instructed to use pull cord when complete to allow staff to assist and pt voiced understanding.   Instructed pt in results of PT evaluation as detailed above, PT POC, rehab potential, rehab goals, and discharge recommendations. Additionally discussed CIR's policies regarding fall safety and use of chair alarm and/or quick release belt. Pt verbalized understanding and in agreement.   Discharge Criteria: Patient will be discharged from PT if patient refuses treatment 3 consecutive times without medical reason, if treatment goals not met, if there is a change in medical status, if patient makes no progress towards goals or if patient is discharged from hospital.  The above assessment, treatment plan, treatment alternatives and goals were discussed and mutually agreed upon: by patient and by family  CAlger SimonsPT, DPT 04/07/2021, 12:50 PM

## 2021-04-07 NOTE — Progress Notes (Signed)
[facility]patient [facility] Individual Statement of Services  Patient Name:  Bryan Crane  Date:  04/07/2021  Welcome to the [facility].  Our goal is to provide you with an individualized program based on your diagnosis and situation, designed to meet your specific needs.  With this comprehensive rehabilitation program, you will be expected to participate in at least 3 hours of rehabilitation therapies Monday-Friday, with modified therapy programming on the weekends.  Your rehabilitation program will include the following services:  Physical Therapy (PT), Occupational Therapy (OT), Speech Therapy ( SP) 24 hour per day rehabilitation nursing, Care Coordinator, Rehabilitation Medicine, Nutrition Services and Pharmacy Services  Weekly team conferences will be held on Tuesday to discuss your progress.  Your Inpatient Rehabilitation Care Coordinator will talk with you frequently to get your input and to update you on team discussions.  Team conferences with you and your family in attendance may also be held.  Expected length of stay: 5-6 days  Overall anticipated outcome: supervision-independent level  Depending on your progress and recovery, your program may change. Your Inpatient Rehabilitation Care Coordinator will coordinate services and will keep you informed of any changes. Your Inpatient Rehabilitation Care Coordinator's name and contact numbers are listed  below.  The following services may also be recommended but are not provided by the Inpatient Rehabilitation Center:   Driving Evaluations  Home Health Rehabiltiation Services  Outpatient Rehabilitation Services    Arrangements will be made to provide these services after discharge if needed.  Arrangements include referral to agencies that provide these services.  Your insurance has been verified to be:  Medicare & [employer] Your primary doctor is:  Majel Homer  Pertinent information will be shared with  your doctor and your insurance company.  Inpatient Rehabilitation Care Coordinator:  Dossie Der, [place] [phone] or Luna Glasgow  Information discussed with and copy given to patient by: Lucy Chris, 04/07/2021, 10:33 AM

## 2021-04-07 NOTE — Evaluation (Signed)
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 Arthritis   . Cardiomyopathy ([place])   . CKD (chronic kidney disease)   . Dyspnea    with exertion  . Enlarged prostate   . GERD (gastroesophageal reflux disease)   . History of kidney stones   . Hypertension   . Sleep apnea    uses cpap   Past Surgical History:  Past Surgical History:  Procedure Laterality Date  . CARDIAC CATHETERIZATION     "years ago" - states it was clear  . COLONOSCOPY    . KIDNEY STONE SURGERY    . LOOP RECORDER INSERTION N/A 04/06/2021   Procedure: LOOP RECORDER INSERTION;  Surgeon: Evans Lance, MD;  Location: [facility];  Service: Cardiovascular;  Laterality: N/A;  . ORIF ELBOW FRACTURE Left 09/30/2018   Procedure: Open reduction internal fixation left elbow fracture with repair reconstruction and  allograft;  Surgeon: Roseanne Kaufman, MD;  Location: [facility];  Service: Orthopedics;  Laterality: Left;  90 mins  . TONSILLECTOMY      Assessment & Plan Clinical Impression:  Bryan Crane. Bryan Crane is a 75 year old right-handed male with history of cardiomyopathy, CKD stage III, hypertension, sleep apnea. Per chart review lives with spouse. 1 level home 3 steps to entry. Reportedly independent prior to admission and active. Presented 04/03/2021 with dizziness slurred speech unsteady gait of acute onset. Cranial CT scan as well as CTA head and neck showed no intracranial hemorrhage. No emergent large vessel occlusion or hemodynamically significant  stenosis. Patient did not receive tPA. 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 No hemorrhage or mass-effect. Echocardiogram with ejection fraction of 60 to 65% no evidence of mitral stenosis. The left ventricle had no regional wall motion abnormalities. Admission chemistries unremarkable except glucose 107 creatinine 1.83 urine drug screen negative. Currently maintained on aspirin 81 mg daily and Plavix 75 mg daily for CVA prophylaxis x3 weeks and aspirin alone. Subcutaneous Lovenox for DVT prophylaxis. Awaiting plan for loop recorder placement.  Patient transferred to CIR on 04/06/2021 .    Patient currently requires supervision with basic self-care skills secondary to decreased cardiorespiratoy endurance, decreased coordination and decreased sitting balance, decreased standing balance and decreased balance strategies.  Prior to hospitalization, patient could complete ADLs and IADLs with independent .  Patient will benefit from skilled intervention to increase independence with basic self-care skills prior to discharge home with care partner.  Anticipate patient will require intermittent supervision and no further OT follow recommended.  OT - End of Session Activity Tolerance: Tolerates 30+ min activity with multiple rests Endurance Deficit: Yes Endurance Deficit Description: mild endurance empairment; pt was very active PLOF walking x 1 hr each day; pt able to complete BADLs without fatigue however anticipate IADLs would require rest breaks at this time. OT Assessment Rehab Potential (ACUTE ONLY): Excellent OT Patient demonstrates impairments in the following area(s): Motor;Endurance OT Basic ADL's Functional Problem(s): Grooming;Bathing;Dressing;Toileting OT Transfers Functional Problem(s): Toilet;Tub/Shower OT Plan OT Intensity: Minimum of 1-2 x/day, 45 to 90 minutes OT Frequency: 5 out of  7 days OT Duration/Estimated Length of Stay: 5-7 days OT  Treatment/Interventions: Balance/vestibular training;Discharge planning;Self Care/advanced ADL retraining;Therapeutic Activities;UE/LE Coordination activities;Disease mangement/prevention;Functional mobility training;Patient/family education;Therapeutic Exercise;DME/adaptive equipment instruction;Neuromuscular re-education;Psychosocial support;UE/LE Strength taining/ROM OT Basic Self-Care Anticipated Outcome(s): mod I OT Toileting Anticipated Outcome(s): mod I OT Bathroom Transfers Anticipated Outcome(s): mod I OT Recommendation Patient destination: Home Follow Up Recommendations: None Equipment Recommended: Tub/shower seat   OT Evaluation Precautions/Restrictions  Precautions Precautions: Fall Precaution Comments: loop recorder placement-keep site dry Restrictions Weight Bearing Restrictions: No General Chart Reviewed: Yes Family/Caregiver Present: Yes (wife) Pain Pain Assessment Pain Scale: 0-10 Pain Score: 0-No pain Home Living/Prior Functioning Home Living Family/patient expects to be discharged to:: Private residence Living Arrangements: Spouse/significant other Available Help at Discharge: Family,Available 24 hours/day Type of Home: House Home Access: Stairs to enter [employer] of Steps: 3 in front. No steps in back Entrance Stairs-Rails: Right,Left,Can reach both Home Layout: One level Bathroom Shower/Tub: Chiropodist: Handicapped height Bathroom Accessibility: Yes  Lives With: Spouse Prior Function Level of Independence: Independent with gait,Independent with transfers,Independent with homemaking with ambulation  Able to Take Stairs?: Yes Driving: Yes Vocation: Retired Comments: independent with all activity, walking ~1 hour at a time and biking at house for activity Vision Baseline Vision/History: No visual deficits Patient Visual Report: No change from baseline Vision Assessment?: Yes Eye Alignment: Within Functional  Limits Ocular Range of Motion: Within Functional Limits Alignment/Gaze Preference: Within Defined Limits Tracking/Visual Pursuits: Able to track stimulus in all quads without difficulty Visual Fields: Left visual field deficit (pt reports this is baseline; unable to remember diagnosis but reports he has recieved steroid injections in left eye for past year.) Perception  Perception: Within Functional Limits Praxis Praxis: Intact Cognition Overall Cognitive Status: Impaired/Different from baseline Arousal/Alertness: Awake/alert Orientation Level: Person;Place;Situation Person: Oriented Place: Oriented Situation: Oriented Year: 2022 Month: June Day of Week: Correct Memory: Appears intact Immediate Memory Recall: Sock;Blue;Bed Memory Recall Sock: Without Cue Memory Recall Blue: Without Cue Memory Recall Bed: Without Cue Attention: Focused;Alternating Focused Attention: Appears intact Awareness: Appears intact Problem Solving: Appears intact Organizing: Appears intact Self Monitoring: Appears intact Safety/Judgment: Appears intact Sensation Sensation Light Touch: Appears Intact Hot/Cold: Appears Intact Proprioception: Appears Intact Stereognosis: Appears Intact Coordination Gross Motor Movements are Fluid and Coordinated: No Fine Motor Movements are Fluid and Coordinated: Yes Finger Nose Finger Test: WNL BUE Heel Shin Test: WNL Motor  Motor Motor: Within Functional Limits  Trunk/Postural Assessment  Cervical Assessment Cervical Assessment: Within Functional Limits Thoracic Assessment Thoracic Assessment: Within Functional Limits Lumbar Assessment Lumbar Assessment: Within Functional Limits Postural Control Postural Control: Within Functional Limits  Balance Balance Balance Assessed: Yes Standardized Balance Assessment Standardized Balance Assessment: Berg Balance Test Berg Balance Test Sit to Stand: Able to stand without using hands and stabilize  independently Standing Unsupported: Able to stand safely 2 minutes Sitting with Back Unsupported but Feet Supported on Floor or Stool: Able to sit safely and securely 2 minutes Stand to Sit: Sits safely with minimal use of hands Transfers: Able to transfer safely, minor use of hands Standing Unsupported with Eyes Closed: Able to stand 10 seconds safely Standing Ubsupported with Feet Together: Able to place feet together independently and stand for 1 minute with supervision From Standing, Reach Forward with Outstretched Arm: Can reach confidently >25 cm (10") From Standing Position, Pick up Object from Floor: Able to pick up shoe, needs supervision From Standing Position, Turn to Look Behind Over each Shoulder: Looks behind from both sides and weight shifts well  Turn 360 Degrees: Able to turn 360 degrees safely one side only in 4 seconds or less Standing Unsupported, Alternately Place Feet on Step/Stool: Able to complete >2 steps/needs minimal assist Standing Unsupported, One Foot in Front: Able to place foot tandem independently and hold 30 seconds Standing on One Leg: Able to lift leg independently and hold 5-10 seconds Total Score: 49 Static Sitting Balance Static Sitting - Balance Support: No upper extremity supported Static Sitting - Level of Assistance: 7: Independent Dynamic Sitting Balance Dynamic Sitting - Balance Support: During functional activity Dynamic Sitting - Level of Assistance: 5: Stand by assistance Dynamic Sitting - Balance Activities: Forward lean/weight shifting Static Standing Balance Static Standing - Balance Support: No upper extremity supported Static Standing - Level of Assistance: 5: Stand by assistance Dynamic Standing Balance Dynamic Standing - Balance Support: No upper extremity supported Dynamic Standing - Level of Assistance: 5: Stand by assistance Dynamic Standing - Balance Activities: Forward lean/weight shifting Extremity/Trunk Assessment RUE  Assessment RUE Assessment: Within Functional Limits LUE Assessment LUE Assessment:  (Fractured left elbow a few years ago per pt report, baseline limited ROM flexion/extension but [facility].)  Care Tool Care Tool Self Care Eating   Eating Assist Level: Independent    Oral Care    Oral Care Assist Level: Set up assist    Bathing   Body parts bathed by patient: Right arm;Right lower leg;Left arm;Left lower leg;Chest;Face;Abdomen;Front perineal area;Buttocks;Right upper leg;Left upper leg     Assist Level: Supervision/Verbal cueing    Upper Body Dressing(including orthotics)   What is the patient wearing?: Pull over shirt   Assist Level: Supervision/Verbal cueing    Lower Body Dressing (excluding footwear)   What is the patient wearing?: Pants;Underwear/pull up Assist for lower body dressing: Supervision/Verbal cueing    Putting on/Taking off footwear   What is the patient wearing?: Non-skid slipper socks Assist for footwear: Set up assist       Care Tool Toileting Toileting activity   Assist for toileting: Contact Guard/Touching assist     Care Tool Bed Mobility Roll left and right activity   Roll left and right assist level: Contact Guard/Touching assist    Sit to lying activity        Lying to sitting edge of bed activity         Care Tool Transfers Sit to stand transfer        Chair/bed transfer         Toilet transfer   Assist Level: Contact Guard/Touching assist     Care Tool Cognition Expression of Ideas and Wants Expression of Ideas and Wants: Without difficulty (complex and basic) - expresses complex messages without difficulty and with speech that is clear and easy to understand   Understanding Verbal and Non-Verbal Content Understanding Verbal and Non-Verbal Content: Understands (complex and basic) - clear comprehension without cues or repetitions   Memory/Recall Ability *first 3 days only Memory/Recall Ability *first 3 days only: Current  season;Location of own room;Staff names and faces;That he or she is in a hospital/hospital unit    Refer to Care Plan for [place] 1 OT Short Term Goal 1 (Week 1): STGs=LTGs due to ELOS  Recommendations for other services: None    Skilled Therapeutic Intervention ADL ADL Grooming: Setup Where Assessed-Grooming: Standing at sink;Sitting at sink Upper Body Bathing: Supervision/safety Where Assessed-Upper Body Bathing: Sitting at sink;Standing at sink Lower Body Bathing: Supervision/safety Where Assessed-Lower Body Bathing: Sitting at sink;Standing at sink  Upper Body Dressing: Supervision/safety Where Assessed-Upper Body Dressing: Sitting at sink;Standing at sink Lower Body Dressing: Supervision/safety Where Assessed-Lower Body Dressing: Sitting at sink;Standing at sink Toileting: Supervision/safety Toilet Transfer: Close supervision Toilet Transfer Method: Ambulating Mobility  Bed Mobility Bed Mobility: Supine to Sit;Sit to Supine Right Sidelying to Sit: Independent Supine to Sit: Independent Sit to Supine: Independent Transfers Sit to Stand: Supervision/Verbal cueing Stand to Sit: Supervision/Verbal cueing   Skilled Intervention:  Pt supine in bed with wife present and asleep at bedside.  Initial evaluation complete and educated pt regarding OT scope of practice, and collaborated with pt regarding OT POC.  Pt completed self care and functional mobility needing levels of assist per above.  Educated pt regarding blood thinner precautions when shaving and he reports good understanding.  Educated pt on use of call bell during functional mobility. Pt requesting to sit EOB at end of session. Call bell in reach, bed alarm on.   Discharge Criteria: Patient will be discharged from OT if patient refuses treatment 3 consecutive times without medical reason, if treatment goals not met, if there is a change in medical status, if patient makes no progress towards  goals or if patient is discharged from hospital.  The above assessment, treatment plan, treatment alternatives and goals were discussed and mutually agreed upon: by patient  Ezekiel Slocumb 04/07/2021, 12:47 PM

## 2021-04-07 NOTE — Progress Notes (Signed)
Inpatient Rehabilitation  Patient information reviewed and entered into eRehab system by Patryck Kilgore Selvin Yun, OTR/L.   Information including medical coding, functional ability and quality indicators will be reviewed and updated through discharge.    

## 2021-04-07 NOTE — Patient Care Conference (Signed)
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 Sula Soda Care Coodinator Present: Chana Bode, RN, BSN, CRRN;Becky Dupree, LCSW Nurse Present: Chana Bode, RN PT Present: Midge Minium, PT;Christian Manhard, PT OT Present: Roney Mans, OT PPS Coordinator present : Edson Snowball, PT     Current Status/Progress Goal Weekly Team Focus  Bowel/Bladder   Patient is continent of B/B. LBM 04/06/21.  Patient will remain continent x2.  Offer toileting prn.   Swallow/Nutrition/ Hydration             ADL's   CGA-supervision  mod I  initial evaluation complete, pt and caregiver education   Mobility   /High level cerebellar stroker. CGA for all mobility. Gait >15ft with CGA and no AD. 12 stairs with CGA and 2 hand rails. BERG 49/56  Mod I  dynamic balance, dynamic gait, DC planning, pt education. ELOS 5 days   Communication             Safety/Cognition/ Behavioral Observations            Pain   Patient denies pain at this time.  Patient will have a pain level of <=2.  Assess pain level q shift and prn.   Skin   Patient has a incision site to left upper chest where loop recorder was placed.  Loop recorder site will remain free from s/s of infection.  Assess site q shift and prn for s/s of infection.     Discharge Planning:  New evaluation today, plan  home with wife who can assist if needed.   Team Discussion: CKD stable per MD , monitoring creatine. Dietary education for HTN management. Monitor effects of visual deficits. Patient on target to meet rehab goals: Currently CGA- supervision with mod I goals set for discharge. CGA for mobility , ambulating 150' without an assistive device and able to manage steps.  *See Care Plan and progress notes for long and short-term goals.   Revisions to Treatment Plan:   Teaching Needs: Transfers, toileting, medications, secondary stroke risk management, etc.  Current Barriers to Discharge: Decreased caregiver support and Home enviroment access/layout  Possible Resolutions to Barriers:  Family education    Medical Summary Current Status: continent, loop recorder with old blood on dressing, ambulating 200 feet, CKD III, visual deficits, HTN and bradycardia  Barriers to Discharge: Medical stability;Wound care  Barriers to Discharge Comments: loop recorder incision, CKD III, HTN and bradycardia Possible Resolutions to [employer] Focus: monitor loop recorder incision site daily, d/c Lovenox, monitor Creatinine, provide dietary education.   Continued Need for Acute Rehabilitation Level of Care: The patient requires daily medical management by a physician with specialized training in physical medicine and rehabilitation for the following reasons: Direction of a multidisciplinary physical rehabilitation program to maximize functional independence : Yes Medical management of patient stability for increased activity during participation in  an intensive rehabilitation regime.: Yes Analysis of laboratory values and/or radiology reports with any subsequent need for medication adjustment and/or medical intervention. : Yes   I attest that I was present, lead the team conference, and concur with the assessment and plan of the team.   Chana Bode B 04/07/2021, 4:10 PM

## 2021-04-07 NOTE — Progress Notes (Signed)
PROGRESS NOTE   Subjective/Complaints: No complaints this morning Eager to participate in therapy Discussed team conference today Cr 1.91, up from 1.84  ROS: denies pain  Objective:   EP PPM/ICD IMPLANT  Result Date: 04/06/2021 CONCLUSIONS:  1. Successful implantation of a [employer] Reveal LINQ implantable loop recorder for cryptogenic stroke  2. No early apparent complications. Lewayne Bunting, MD 04/06/2021 6:05 PM   Recent Labs    04/06/21 2005 04/07/21 0509  WBC 8.0 9.6  HGB 15.0 14.5  HCT 42.8 41.9  PLT 229 232   Recent Labs    04/06/21 2005 04/07/21 0509  NA  --  135  K  --  4.0  CL  --  103  CO2  --  24  GLUCOSE  --  105*  BUN  --  26*  CREATININE 1.84* 1.91*  CALCIUM  --  8.8*    Intake/Output Summary (Last 24 hours) at 04/07/2021 1127 Last data filed at 04/07/2021 0837 Gross per 24 hour  Intake 596 ml  Output 4 ml  Net 592 ml        Physical Exam: Vital Signs Blood pressure (!) 151/81, pulse (!) 56, temperature 98.2 F (36.8 C), resp. rate 17, height 5\' 6"  (1.676 m), weight 89.5 kg, SpO2 100 %. Gen: no distress, normal appearing HEENT: oral mucosa pink and moist, +visual impairment Cardio: Reg rate Chest: normal effort, normal rate of breathing Abd: soft, non-distended Ext: no edema Psych: pleasant, normal affect Skin: General: Skin is warmand dry.  Neurological:  Comments: Patient is alert in no acute distress. Makes eye contact with examiner. Provides name and age. Follows simple commands. Mild dysarthria. Fair insight and awareness. Mild right limb ataxia with past-pointing. Strength 4/5 RUE and RLE and 4+ LUE and LLE. No sensory changes. DTR's 1+   Assessment/Plan: 1. Functional deficits which require 3+ hours per day of interdisciplinary therapy in a comprehensive inpatient rehab setting.  Physiatrist is providing close team supervision and 24 hour management of active  medical problems listed below.  Physiatrist and rehab team continue to assess barriers to discharge/monitor patient progress toward functional and medical goals  Care Tool:  Bathing              Bathing assist       Upper Body Dressing/Undressing Upper body dressing        Upper body assist Assist Level: Contact Guard/Touching assist    Lower Body Dressing/Undressing Lower body dressing            Lower body assist       Toileting Toileting    Toileting assist Assist for toileting: Contact Guard/Touching assist     Transfers Chair/bed transfer  Transfers assist           Locomotion Ambulation   Ambulation assist              Walk 10 feet activity   Assist           Walk 50 feet activity   Assist           Walk 150 feet activity   Assist  Walk 10 feet on uneven surface  activity   Assist           Wheelchair     Assist               Wheelchair 50 feet with 2 turns activity    Assist            Wheelchair 150 feet activity     Assist          Blood pressure (!) 151/81, pulse (!) 56, temperature 98.2 F (36.8 C), resp. rate 17, height 5\' 6"  (1.676 m), weight 89.5 kg, SpO2 100 %.  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 No hemorrhage or mass-effect. Status post loop recorder. -patient may shower -ELOS/Goals: 7-10 days, mod I to supervision with PT and OT and mod I with SLP.  -Interdisciplinary Team Conference today  2. Antithrombotics: -DVT/anticoagulation:d/c Subcutaneous Lovenox since ambulating 200 feet -antiplatelet therapy: Aspirin 81 mg daily and Plavix 75 mg daily x3 weeks then aspirin alone 3. Pain Management:Tylenol as needed 4. Mood:Provide emotional support -antipsychotic agents: N/A 5.  Neuropsych: This patientiscapable of making decisions on hisown behalf. 6. Skin/Wound Care:Bleeding from loop recorder site: d/ce lovenox.  7. Fluids/Electrolytes/Nutrition:Routine in and outs with follow-up chemistrieson admit 8. Hyperlipidemia. Lipitor 9. CKD stage III. Admission creatinine 1.83. Up to 1.91 on 6/7, monitor weekly.  10. BPH. Flomax 0.8 mg daily. Check PVR's             -up to toilet to void 11. History of cardiomyopathy. Continue Ranexa 1000 mg daily. No chest pain or shortness of breath -check weights weekly 12. OSA. CPAP.    LOS: 1 days A FACE TO FACE EVALUATION WAS PERFORMED  8/7 P Sriman Tally 04/07/2021, 11:27 AM

## 2021-04-07 NOTE — Evaluation (Signed)
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 Arthritis   . Cardiomyopathy ([place])   . CKD (chronic kidney disease)   . Dyspnea    with exertion  . Enlarged prostate   . GERD (gastroesophageal reflux disease)   . History of kidney stones   . Hypertension   . Sleep apnea    uses cpap   Past Surgical History:  Past Surgical History:  Procedure Laterality Date  . CARDIAC CATHETERIZATION     "years ago" - states it was clear  . COLONOSCOPY    . KIDNEY STONE SURGERY    . LOOP RECORDER INSERTION N/A 04/06/2021   Procedure: LOOP RECORDER INSERTION;  Surgeon: Evans Lance, MD;  Location: [facility];  Service: Cardiovascular;  Laterality: N/A;  . ORIF ELBOW FRACTURE Left 09/30/2018   Procedure: Open reduction internal fixation left elbow fracture with repair reconstruction and  allograft;  Surgeon: Roseanne Kaufman, MD;  Location: [facility];  Service: Orthopedics;  Laterality: Left;  90 mins  . TONSILLECTOMY      Assessment / Plan / Recommendation Clinical Impression Bryan [facility] is a 75 year old right-handed male with history of cardiomyopathy, CKD stage III, hypertension, sleep apnea. Per chart review lives with spouse. 1 level home 3 steps to entry. Reportedly independent prior to admission and active. Presented 04/03/2021 with dizziness slurred speech unsteady gait of acute onset. Cranial CT scan as well as CTA head and neck showed no intracranial hemorrhage. No emergent large vessel occlusion or hemodynamically significant stenosis. Patient  did not receive tPA. 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 No hemorrhage or mass-effect. Echocardiogram with ejection fraction of 60 to 65% no evidence of mitral stenosis. The left ventricle had no regional wall motion abnormalities. Admission chemistries unremarkable except glucose 107 creatinine 1.83 urine drug screen negative. Currently maintained on aspirin 81 mg daily and Plavix 75 mg daily for CVA prophylaxis x3 weeks and aspirin alone. Subcutaneous Lovenox for DVT prophylaxis. Awaiting plan for loop recorder placement. Therapy evaluations completed due to patient's decreased functional ability gait abnormality was admitted for a comprehensive rehab program.  Pt presents with cognitive linguistic skills WFL, supported by formal cognitive linguistic assessment utilizing CLQT and further supported by spouse. Pt demonstrates complex problem-solving skills, anticipatory awareness, alternating attention and appropriate recall. No further ST services needed.    Skilled Therapeutic Interventions          Skilled ST services focused on cognitive skills. SLP facilitated administration of cognitive linguistic formal assessment and provided education of results. All questions answered to satisfaction.  Pt was left in room with call bell within reach and chair alarm set.   SLP Assessment  Patient does not need any further Speech [facility] Pathology Services    Recommendations  Patient destination: Home Follow up Recommendations: None Equipment Recommended: None recommended by SLP    SLP Frequency  (N/A)   SLP Duration  SLP Intensity  SLP Treatment/Interventions N/A   (N/A)    N/A   Pain Pain Assessment Pain Score: 0-No pain  Prior Functioning Cognitive/Linguistic  Baseline: Within functional limits Type of Home: House  Lives With: Spouse Available Help at Discharge: Family;Available 24 hours/day Education: college Vocation: Retired  Museum/gallery curator Overall Cognitive Status: Within Functional Limits for tasks assessed Arousal/Alertness: Awake/alert Orientation Level: Oriented X4 Attention: Alternating;Selective Focused Attention: Appears intact Selective Attention: Appears intact Alternating Attention: Appears intact Alternating Attention Impairment: Verbal complex;Functional complex Memory: Appears intact Awareness: Appears intact Problem Solving: Appears intact Problem Solving Impairment: Verbal complex;Functional complex Safety/Judgment: Appears intact  Comprehension Auditory Comprehension Overall Auditory Comprehension: Appears within functional limits for tasks assessed Expression Expression Primary Mode of Expression: Verbal Verbal Expression Overall Verbal Expression: Appears within functional limits for tasks assessed Oral Motor Oral Motor/Sensory Function Overall Oral Motor/Sensory Function: Within functional limits Motor Speech Overall Motor Speech: Appears within functional limits for tasks assessed Respiration: Within functional limits Phonation: Normal Resonance: Within functional limits Articulation: Within functional limitis Intelligibility: Intelligible Motor Planning: Witnin functional limits Motor Speech Errors: Not applicable  Care Tool Care Tool Cognition Expression of Ideas and Wants Expression of Ideas and Wants: Without difficulty (complex and basic) - expresses complex messages without difficulty and with speech that is clear and easy to understand   Understanding Verbal and Non-Verbal Content Understanding Verbal and Non-Verbal Content: Understands (complex and basic) - clear comprehension without cues or repetitions   Memory/Recall Ability *first 3 days only Memory/Recall Ability *first 3 days only: Current season;Location of own room;Staff names and faces;That he or she is in a hospital/hospital unit       Short Term Goals: No short term goals set  Refer to Care  Plan for Long Term Goals  Recommendations for other services: None   Discharge Criteria: Patient will be discharged from SLP if patient refuses treatment 3 consecutive times without medical reason, if treatment goals not met, if there is a change in medical status, if patient makes no progress towards goals or if patient is discharged from hospital.  The above assessment, treatment plan, treatment alternatives and goals were discussed and mutually agreed upon: by patient and by family  Avamarie Crossley  [facility] 04/07/2021, 4:37 PM

## 2021-04-08 ENCOUNTER — Encounter (HOSPITAL_COMMUNITY): Payer: Self-pay | Admitting: Physical Medicine and Rehabilitation

## 2021-04-08 LAB — BASIC METABOLIC PANEL
Anion gap: 10 (ref 5–15)
BUN: 28 mg/dL — ABNORMAL HIGH (ref 8–23)
CO2: 26 mmol/L (ref 22–32)
Calcium: 9.3 mg/dL (ref 8.9–10.3)
Chloride: 100 mmol/L (ref 98–111)
Creatinine, Ser: 1.77 mg/dL — ABNORMAL HIGH (ref 0.61–1.24)
GFR, Estimated: 40 mL/min — ABNORMAL LOW (ref >60)
Glucose, Bld: 103 mg/dL — ABNORMAL HIGH (ref 70–99)
Potassium: 4.1 mmol/L (ref 3.5–5.1)
Sodium: 136 mmol/L (ref 135–145)

## 2021-04-08 NOTE — Discharge Summary (Signed)
Physician Discharge Summary  Patient ID: Bryan Crane MRN: [id number] DOB/AGE: June 27, 1946 75 y.o.  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 COMPARISON:  CTA head neck 04/04/2021 FINDINGS: Brain: There is a small acute infarct of the anterior superior right cerebellum. No acute or chronic hemorrhage. Normal white matter signal, parenchymal volume and CSF spaces. The midline structures are normal. Vascular: Major flow voids are preserved. Skull and upper cervical spine: Normal calvarium and skull base. Visualized upper cervical spine and soft tissues are normal. Sinuses/Orbits:No paranasal sinus fluid levels or advanced mucosal thickening. No mastoid or middle ear effusion. Normal orbits. 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 No hemorrhage or mass effect. Electronically Signed   By: Deatra Robinson M.D.   On: 04/04/2021 02:32   EP PPM/ICD IMPLANT  Result Date: 04/06/2021 CONCLUSIONS:  1. Successful implantation of a [employer] Reveal LINQ implantable loop recorder for cryptogenic stroke  2. No early apparent complications. Lewayne Bunting, MD 04/06/2021 6:05 PM   ECHOCARDIOGRAM COMPLETE  Result Date: 04/04/2021    ECHOCARDIOGRAM REPORT   Patient Name:   Bryan Crane Date of Exam: 04/04/2021 Medical Rec #:  [id number]         Height:       66.0 in Accession #:    [id number]        Weight:       195.0 lb Date of Birth:  1945/11/02         BSA:          1.978 m Patient Age:    74  years          BP:           167/78 mmHg Patient Gender: M                 HR:           55 bpm. Exam Location:  Inpatient Procedure: 2D Echo Indications:    stroke  History:        Patient has no prior history of Echocardiogram examinations.                 Cardiomyopathy, chronic kidney disease, Arrythmias:PVC; Risk                 Factors:Hypertension.  Sonographer:    Delcie Roch Referring Phys: [id number] BRADLEY S CHOTINER IMPRESSIONS  1. Left ventricular ejection fraction, by estimation, is 60 to 65%. The left ventricle has normal function. The left ventricle has no regional wall motion abnormalities. Left ventricular diastolic parameters were normal.  2. Right ventricular systolic function is normal. The right ventricular size is normal.  3. The mitral valve is normal in structure. No evidence of mitral valve regurgitation. No evidence of mitral stenosis.  4. The aortic valve is normal in structure. Aortic valve regurgitation is not visualized. No aortic stenosis is present.  5. The inferior vena cava is normal in size with greater than 50% respiratory variability, suggesting right atrial pressure of 3 mmHg. FINDINGS  Left Ventricle: Left ventricular ejection fraction, by estimation, is 60 to 65%. The left  ventricle has normal function. The left ventricle has no regional wall motion abnormalities. The left ventricular internal cavity size was normal in size. There is  no left ventricular hypertrophy. Left ventricular diastolic parameters were normal. Normal left ventricular filling pressure. Right Ventricle: The right ventricular size is normal. No increase in right ventricular wall thickness. Right ventricular systolic function is normal. Left Atrium: Left atrial size was normal in size. Right Atrium: Right atrial size was normal in size. Pericardium: There is no evidence of pericardial effusion. Mitral Valve: The mitral valve is normal in structure. No evidence of mitral valve regurgitation. No evidence  of mitral valve stenosis. Tricuspid Valve: The tricuspid valve is normal in structure. Tricuspid valve regurgitation is not demonstrated. No evidence of tricuspid stenosis. Aortic Valve: The aortic valve is normal in structure. Aortic valve regurgitation is not visualized. No aortic stenosis is present. Pulmonic Valve: The pulmonic valve was normal in structure. Pulmonic valve regurgitation is not visualized. No evidence of pulmonic stenosis. Aorta: The aortic root is normal in size and structure. Venous: The inferior vena cava is normal in size with greater than 50% respiratory variability, suggesting right atrial pressure of 3 mmHg. IAS/Shunts: No atrial level shunt detected by color flow Doppler.  LEFT VENTRICLE PLAX 2D LVIDd:         4.80 cm     Diastology LVIDs:         4.10 cm     LV e' medial:    4.46 cm/s LV PW:         1.30 cm     LV E/e' medial:  11.2 LV IVS:        1.20 cm     LV e' lateral:   6.53 cm/s LVOT diam:     1.90 cm     LV E/e' lateral: 7.7 LV SV:         76 LV SV Index:   38 LVOT Area:     2.84 cm  LV Volumes (MOD) LV vol d, MOD A4C: 84.8 ml LV vol s, MOD A4C: 45.6 ml LV SV MOD A4C:     84.8 ml RIGHT VENTRICLE             IVC RV S prime:     15.20 cm/s  IVC diam: 1.70 cm TAPSE (M-mode): 2.1 cm LEFT ATRIUM             Index       RIGHT ATRIUM           Index LA diam:        3.70 cm 1.87 cm/m  RA Area:     13.40 cm LA Vol (A2C):   50.7 ml 25.63 ml/m RA Volume:   32.30 ml  16.33 ml/m LA Vol (A4C):   54.3 ml 27.45 ml/m LA Biplane Vol: 55.8 ml 28.20 ml/m  AORTIC VALVE LVOT Vmax:   117.00 cm/s LVOT Vmean:  74.500 cm/s LVOT VTI:    0.267 m  AORTA Ao Root diam: 3.10 cm Ao Asc diam:  2.80 cm MITRAL VALVE MV Area (PHT): 2.62 cm    SHUNTS MV Decel Time: 289 msec    Systemic VTI:  0.27 m MV E velocity: 50.10 cm/s  Systemic Diam: 1.90 cm MV A velocity: 69.70 cm/s MV E/A ratio:  0.72 Mihai Croitoru MD Electronically signed by Thurmon Fair MD Signature Date/Time: 04/04/2021/11:29:19 AM    Final     CT HEAD CODE STROKE WO CONTRAST  Result Date: 04/04/2021  CLINICAL DATA:  Code stroke.  Ataxia and dysarthria. EXAM: CT HEAD WITHOUT CONTRAST CT ANGIOGRAPHY OF THE HEAD AND NECK TECHNIQUE: Contiguous axial images were obtained from the base of the skull through the vertex without intravenous contrast. Multidetector CT imaging of the head and neck was performed using the standard protocol during bolus administration of intravenous contrast. Multiplanar CT image reconstructions and MIPs were obtained to evaluate the vascular anatomy. Carotid stenosis measurements (when applicable) are obtained utilizing NASCET criteria, using the distal internal carotid diameter as the denominator. CONTRAST:  75mL OMNIPAQUE IOHEXOL 350 MG/ML SOLN COMPARISON:  None. FINDINGS: CT HEAD FINDINGS Brain: There is no mass, hemorrhage or extra-axial collection. The size and configuration of the ventricles and extra-axial CSF spaces are normal. The brain parenchyma is normal, without evidence of acute or chronic infarction. Vascular: No abnormal hyperdensity of the major intracranial arteries or dural venous sinuses. No intracranial atherosclerosis. Skull: The visualized skull base, calvarium and extracranial soft tissues are normal. Sinuses/Orbits: No fluid levels or advanced mucosal thickening of the visualized paranasal sinuses. No mastoid or middle ear effusion. The orbits are normal. ASPECTS (Alberta Stroke Program Early CT Score) - Ganglionic level infarction (caudate, lentiform nuclei, internal capsule, insula, M1-M3 cortex): 7 - Supraganglionic infarction (M4-M6 cortex): 3 Total score (0-10 with 10 being normal): 10 CTA NECK FINDINGS SKELETON: There is no bony spinal canal stenosis. No lytic or blastic lesion. OTHER NECK: Normal pharynx, larynx and major salivary glands. No cervical lymphadenopathy. Unremarkable thyroid gland. UPPER CHEST: No pneumothorax or pleural effusion. No nodules or masses. AORTIC ARCH: There is no calcific  atherosclerosis of the aortic arch. There is no aneurysm, dissection or hemodynamically significant stenosis of the visualized portion of the aorta. Conventional 3 vessel aortic branching pattern. The visualized proximal subclavian arteries are widely patent. RIGHT CAROTID SYSTEM: Normal without aneurysm, dissection or stenosis. LEFT CAROTID SYSTEM: Normal without aneurysm, dissection or stenosis. VERTEBRAL ARTERIES: Left dominant configuration. Both origins are clearly patent. There is no dissection, occlusion or flow-limiting stenosis to the skull base (V1-V3 segments). CTA HEAD FINDINGS POSTERIOR CIRCULATION: --Vertebral arteries: Normal V4 segments. --Inferior cerebellar arteries: Normal. --Basilar artery: Normal. --Superior cerebellar arteries: Normal. --Posterior cerebral arteries (PCA): Mild distal left PCA narrowing. Normal right. ANTERIOR CIRCULATION: --Intracranial internal carotid arteries: Normal. --Anterior cerebral arteries (ACA): Normal. Both A1 segments are present. Patent anterior communicating artery (a-comm). --Middle cerebral arteries (MCA): Normal. VENOUS SINUSES: As permitted by contrast timing, patent. ANATOMIC VARIANTS: Hypoplastic left ACA A1 segment, a common variant. Review of the MIP images confirms the above findings. IMPRESSION: 1. No intracranial hemorrhage.  ASPECTS is 10. 2. No emergent large vessel occlusion or hemodynamically significant stenosis by NASCET criteria. 3. Normal carotid and vertebral arteries. These results were called by telephone at the time of interpretation on 04/04/2021 at 1:14 am to provider [facility] , who verbally acknowledged these results. Electronically Signed   By: Deatra RobinsonKevin  Herman M.D.   On: 04/04/2021 01:18   CT ANGIO HEAD CODE STROKE  Result Date: 04/04/2021 CLINICAL DATA:  Code stroke.  Ataxia and dysarthria. EXAM: CT HEAD WITHOUT CONTRAST CT ANGIOGRAPHY OF THE HEAD AND NECK TECHNIQUE: Contiguous axial images were obtained from the base of the skull  through the vertex without intravenous contrast. Multidetector CT imaging of the head and neck was performed using the standard protocol during bolus administration of intravenous contrast. Multiplanar CT image reconstructions and MIPs were obtained to evaluate the vascular anatomy. Carotid stenosis measurements (when applicable) are obtained utilizing NASCET criteria, using  the distal internal carotid diameter as the denominator. CONTRAST:  25mL OMNIPAQUE IOHEXOL 350 MG/ML SOLN COMPARISON:  None. FINDINGS: CT HEAD FINDINGS Brain: There is no mass, hemorrhage or extra-axial collection. The size and configuration of the ventricles and extra-axial CSF spaces are normal. The brain parenchyma is normal, without evidence of acute or chronic infarction. Vascular: No abnormal hyperdensity of the major intracranial arteries or dural venous sinuses. No intracranial atherosclerosis. Skull: The visualized skull base, calvarium and extracranial soft tissues are normal. Sinuses/Orbits: No fluid levels or advanced mucosal thickening of the visualized paranasal sinuses. No mastoid or middle ear effusion. The orbits are normal. ASPECTS (Alberta Stroke Program Early CT Score) - Ganglionic level infarction (caudate, lentiform nuclei, internal capsule, insula, M1-M3 cortex): 7 - Supraganglionic infarction (M4-M6 cortex): 3 Total score (0-10 with 10 being normal): 10 CTA NECK FINDINGS SKELETON: There is no bony spinal canal stenosis. No lytic or blastic lesion. OTHER NECK: Normal pharynx, larynx and major salivary glands. No cervical lymphadenopathy. Unremarkable thyroid gland. UPPER CHEST: No pneumothorax or pleural effusion. No nodules or masses. AORTIC ARCH: There is no calcific atherosclerosis of the aortic arch. There is no aneurysm, dissection or hemodynamically significant stenosis of the visualized portion of the aorta. Conventional 3 vessel aortic branching pattern. The visualized proximal subclavian arteries are widely  patent. RIGHT CAROTID SYSTEM: Normal without aneurysm, dissection or stenosis. LEFT CAROTID SYSTEM: Normal without aneurysm, dissection or stenosis. VERTEBRAL ARTERIES: Left dominant configuration. Both origins are clearly patent. There is no dissection, occlusion or flow-limiting stenosis to the skull base (V1-V3 segments). CTA HEAD FINDINGS POSTERIOR CIRCULATION: --Vertebral arteries: Normal V4 segments. --Inferior cerebellar arteries: Normal. --Basilar artery: Normal. --Superior cerebellar arteries: Normal. --Posterior cerebral arteries (PCA): Mild distal left PCA narrowing. Normal right. ANTERIOR CIRCULATION: --Intracranial internal carotid arteries: Normal. --Anterior cerebral arteries (ACA): Normal. Both A1 segments are present. Patent anterior communicating artery (a-comm). --Middle cerebral arteries (MCA): Normal. VENOUS SINUSES: As permitted by contrast timing, patent. ANATOMIC VARIANTS: Hypoplastic left ACA A1 segment, a common variant. Review of the MIP images confirms the above findings. IMPRESSION: 1. No intracranial hemorrhage.  ASPECTS is 10. 2. No emergent large vessel occlusion or hemodynamically significant stenosis by NASCET criteria. 3. Normal carotid and vertebral arteries. These results were called by telephone at the time of interpretation on 04/04/2021 at 1:14 am to provider ERIC [facility] , who verbally acknowledged these results. Electronically Signed   By: Deatra Robinson M.D.   On: 04/04/2021 01:18   CT ANGIO NECK CODE STROKE  Result Date: 04/04/2021 CLINICAL DATA:  Code stroke.  Ataxia and dysarthria. EXAM: CT HEAD WITHOUT CONTRAST CT ANGIOGRAPHY OF THE HEAD AND NECK TECHNIQUE: Contiguous axial images were obtained from the base of the skull through the vertex without intravenous contrast. Multidetector CT imaging of the head and neck was performed using the standard protocol during bolus administration of intravenous contrast. Multiplanar CT image reconstructions and MIPs were obtained to  evaluate the vascular anatomy. Carotid stenosis measurements (when applicable) are obtained utilizing NASCET criteria, using the distal internal carotid diameter as the denominator. CONTRAST:  61mL OMNIPAQUE IOHEXOL 350 MG/ML SOLN COMPARISON:  None. FINDINGS: CT HEAD FINDINGS Brain: There is no mass, hemorrhage or extra-axial collection. The size and configuration of the ventricles and extra-axial CSF spaces are normal. The brain parenchyma is normal, without evidence of acute or chronic infarction. Vascular: No abnormal hyperdensity of the major intracranial arteries or dural venous sinuses. No intracranial atherosclerosis. Skull: The visualized skull base, calvarium and extracranial soft tissues  are normal. Sinuses/Orbits: No fluid levels or advanced mucosal thickening of the visualized paranasal sinuses. No mastoid or middle ear effusion. The orbits are normal. ASPECTS (Alberta Stroke Program Early CT Score) - Ganglionic level infarction (caudate, lentiform nuclei, internal capsule, insula, M1-M3 cortex): 7 - Supraganglionic infarction (M4-M6 cortex): 3 Total score (0-10 with 10 being normal): 10 CTA NECK FINDINGS SKELETON: There is no bony spinal canal stenosis. No lytic or blastic lesion. OTHER NECK: Normal pharynx, larynx and major salivary glands. No cervical lymphadenopathy. Unremarkable thyroid gland. UPPER CHEST: No pneumothorax or pleural effusion. No nodules or masses. AORTIC ARCH: There is no calcific atherosclerosis of the aortic arch. There is no aneurysm, dissection or hemodynamically significant stenosis of the visualized portion of the aorta. Conventional 3 vessel aortic branching pattern. The visualized proximal subclavian arteries are widely patent. RIGHT CAROTID SYSTEM: Normal without aneurysm, dissection or stenosis. LEFT CAROTID SYSTEM: Normal without aneurysm, dissection or stenosis. VERTEBRAL ARTERIES: Left dominant configuration. Both origins are clearly patent. There is no dissection,  occlusion or flow-limiting stenosis to the skull base (V1-V3 segments). CTA HEAD FINDINGS POSTERIOR CIRCULATION: --Vertebral arteries: Normal V4 segments. --Inferior cerebellar arteries: Normal. --Basilar artery: Normal. --Superior cerebellar arteries: Normal. --Posterior cerebral arteries (PCA): Mild distal left PCA narrowing. Normal right. ANTERIOR CIRCULATION: --Intracranial internal carotid arteries: Normal. --Anterior cerebral arteries (ACA): Normal. Both A1 segments are present. Patent anterior communicating artery (a-comm). --Middle cerebral arteries (MCA): Normal. VENOUS SINUSES: As permitted by contrast timing, patent. ANATOMIC VARIANTS: Hypoplastic left ACA A1 segment, a common variant. Review of the MIP images confirms the above findings. IMPRESSION: 1. No intracranial hemorrhage.  ASPECTS is 10. 2. No emergent large vessel occlusion or hemodynamically significant stenosis by NASCET criteria. 3. Normal carotid and vertebral arteries. These results were called by telephone at the time of interpretation on 04/04/2021 at 1:14 am to provider ERIC [facility] , who verbally acknowledged these results. Electronically Signed   By: Deatra Robinson M.D.   On: 04/04/2021 01:18     Labs:  Basic Metabolic Panel: Recent Labs  Lab 04/04/21 0030 04/04/21 0053 04/06/21 2005 04/07/21 0509 04/08/21 0533  NA 137 141  --  135 136  K 4.5 4.0  --  4.0 4.1  CL 106 107  --  103 100  CO2 22  --   --  24 26  GLUCOSE 107* 107*  --  105* 103*  BUN 20 22  --  26* 28*  CREATININE 1.83* 1.80* 1.84* 1.91* 1.77*  CALCIUM 9.4  --   --  8.8* 9.3    CBC: Recent Labs  Lab 04/04/21 0030 04/04/21 0053 04/06/21 2005 04/07/21 0509  WBC 9.5  --  8.0 9.6  NEUTROABS 6.4  --   --  6.1  HGB 16.7 16.7 15.0 14.5  HCT 48.0 49.0 42.8 41.9  MCV 90.9  --  90.9 90.5  PLT 304  --  229 232    CBG: Recent Labs  Lab 04/04/21 0036  GLUCAP 123*   Family history.  Positive for hypertension as well as hyperlipidemia.  Denies any  colon cancer or esophageal cancer  Brief HPI:   ROSHON DUELL is a 75 y.o. right-handed male with history of cardiomyopathy CKD stage III hypertension and sleep apnea.  Lives with spouse 1 level home.  Reportedly independent prior to admission and active.  Presented 04/03/2021 with dizziness slurred speech unsteady gait of acute onset.  Cranial CT scan as well as CTA head and neck showed no intracranial hemorrhage.  No emergent large vessel occlusion or hemodynamically significant stenosis.  Patient did not receive tPA.  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  No hemorrhage or mass-effect.  Echocardiogram with ejection fraction of 60 to 65% no evidence of mitral stenosis.  The left ventricle had no regional wall motion abnormalities.  Admission chemistries unremarkable except glucose 107 creatinine 1.83 urine drug screen negative.  Maintained on aspirin 81 mg daily and Plavix 75 mg daily x3 weeks and aspirin alone.  Subcutaneous Lovenox for DVT prophylaxis.  Patient did undergo placement of loop recorder.  Therapy evaluations completed due to patient decreased functional mobility was admitted for a comprehensive rehab program.   Hospital Course: TENOCH MCCLURE was admitted to rehab 04/06/2021 for inpatient therapies to consist of PT, ST and OT at least three hours five days a week. Past admission physiatrist, therapy team and rehab RN have worked together to provide customized collaborative inpatient rehab.  Pertaining to patient's small acute infarct of the anterior superior right cerebellum remained stable.  Status post loop recorder.  Aspirin and Plavix x3 weeks then aspirin alone.  Patient would follow-up neurology services.  Subcutaneous Lovenox for DVT prophylaxis discontinued patient ambulating greater than 200 feet.  Lipitor ongoing for hyperlipidemia.  CKD stage III creatinine baseline 1.83 up to 1.91 and monitored.  BPH maintained on Flomax no voiding difficulty.   History of cardiomyopathy no chest pain or shortness of breath patient remained on Ranexa.  OSA CPAP as directed.  No increasing shortness of breath.   Blood pressures were monitored on TID basis and controlled     Rehab course: During patient's stay in rehab weekly team conferences were held to monitor patient's progress, set goals and discuss barriers to discharge. At admission, patient required minimal assist stand pivot transfers min mod assist ambulation 45 feet rolling walker minimal guard upper body bathing minimal assist lower body bathing minimal guard upper body dressing minimal assist lower body dressing  Physical exam.  Blood pressure 153/72 pulse 57 temperature 97.6 respirations 20 oxygen saturation 100% room air Constitutional.  No acute distress HEENT Head.  Normocephalic and atraumatic Eyes.  Pupils round and reactive to light no discharge without nystagmus Neck.  Supple nontender no JVD without thyromegaly Cardiac regular rate rhythm not extra sounds or murmur heard Abdomen.  Soft nontender positive bowel sounds without rebound Respiratory effort normal no respiratory distress without wheeze Skin.  Warm and dry Neurologic.  Alert oriented x3 no acute distress provides name and age follows commands mild dysarthria and fully intelligible.  Fair insight and awareness.  Mild right limb ataxia with past-pointing.  Strength 4/5 right upper and right lower extremity and 4+ left upper and left lower extremity.  No sensory changes.  He/She  has had improvement in activity tolerance, balance, postural control as well as ability to compensate for deficits. He/She has had improvement in functional use RUE/LUE  and RLE/LLE as well as improvement in awareness.  Patient with excellent overall gains contact-guard assist for mobility ambulating greater than 200 feet.  He can gather his belongings for activities day living and homemaking with contact-guard supervision and he was advised no  driving.  Full family teaching completed plan discharged home       Disposition: Discharged to home    Diet: Regular  Special Instructions: No driving smoking or alcohol  Medications at discharge 1.  Tylenol as needed 2.  Aspirin 81 mg p.o. daily 3.  Lipitor 40 mg p.o. daily  4.  Alphagan ophthalmic solution 1 drop 3 times daily both eyes 5.  Plavix 75 mg p.o. daily x14 days and stop 6.  Cosopt ophthalmic solution 1 drop both eyes twice daily 7.   Xalatan ophthalmic solution 0.05% 1 drop both eyes nightly 8.  Ranexa 1000 mg p.o. daily 9.  Multivitamin daily 10.  Protonix 40 mg p.o. daily 11.  Flomax 0.8 mg daily  30-35 minutes were spent completing discharge summary and discharge planning  Discharge Instructions     Ambulatory referral to Neurology   Complete by: As directed    An appointment is requested in approximately 4 weeks infarction anterior superior right cerebellum   Ambulatory referral to Physical Medicine Rehab   Complete by: As directed    Moderate complexity follow-up 1 to 2 weeks infarction anterior superior right cerebellum        Follow-up Information     Raulkar, Drema Pry, MD Follow up.   Specialty: Physical Medicine and Rehabilitation Why: 04/30/21 to arrive at 10:40 for 11:00 Contact information: [address] [place] [address] [phone]         Marinus Maw, MD Follow up.   Specialty: Cardiology Why: Call for appointment Contact information: [address] [place] [address] [phone]                 Signed: Mcarthur Rossetti Berklie Dethlefs 04/10/2021, 5:31 AM

## 2021-04-08 NOTE — Progress Notes (Signed)
Physical Therapy Session Note  Patient Details  Name: Bryan Crane MRN: [id number] Date of Birth: October 21, 1946  Today's Date: 04/08/2021 PT Individual Time: 1007-1032 PT Individual Time Calculation (min): 25 min   Short Term Goals: Week 1:  PT Short Term Goal 1 (Week 1): STG = LTG due to ELOS  Skilled Therapeutic Interventions/Progress Updates:    Pt received supine in bed with his wife present and pt agreeable to therapy session. Supine>sitting R EOB supervision. Pt reports need to use bathroom. Sit<>stands with close supervision. Gait training ~79ft 2x in/out of bathroom, no AD, with close supervision. Pt continently voided bladder in standing with distant supervision for safety. Standing hand hygiene at sink with supervision for safety. Gait training ~117ft 2x to/from main therapy gym, no AD, with CGA for safety - demos reciprocal stepping pattern with minimally decreased gait speed. Dynamic gait training including: horizontal and vertical head turns, sudden start stops, sudden R/L turns, lateral stepping, and backwards walking - pt demos minor LOB when looking up but able to recover with no more assist, and demos short R LE steps when ambulating backwards that improved with repetition.  Performed the following dynamic standing balance tasks: - cross body 2kg ball reaches going from squatted position to standing working on postural control with CGA for safety and no overt LOB - 3 cone taps on verbal command with pt demoing some decreased coordination in R LE compared to L but not significant and no overt LOB, CGA for steadying  Dynamic gait and dynamic standing balance while dribbling and shooting a basket ball including retrieving the ball with pt requiring min assist for balance during this task with the quick and sudden moves and turns. At end of session pt left supine in bed with needs in reach, bed alarm on, and his wife present.   Therapy Documentation Precautions:   Precautions Precautions: Fall Precaution Comments: loop recorder placement-keep site dry Restrictions Weight Bearing Restrictions: No  Pain:   No reports of pain throughout session.  Therapy/Group: Individual Therapy  Ginny Forth , PT, DPT, CSRS  04/08/2021, 7:56 AM

## 2021-04-08 NOTE — Progress Notes (Signed)
Patient ID: Bryan Crane, male   DOB: 1946/07/28, 75 y.o.   MRN: [id number]  Met with pt and wife to discuss discharge date of 6/10 and needs. They will get tub seat on their own due to not covered anyway. Want to go to [facility] OP and will sent referral and ask to contact wife to set up appointment.

## 2021-04-08 NOTE — Progress Notes (Signed)
Occupational Therapy Session Note  Patient Details  Name: Bryan Crane MRN: [id number] Date of Birth: Feb 21, 1946  Today's Date: 04/08/2021 OT Individual Time: 1300-1400 OT Individual Time Calculation (min): 60 min    Short Term Goals: Week 1:  OT Short Term Goal 1 (Week 1): STGs=LTGs due to ELOS  Skilled Therapeutic Interventions/Progress Updates:    Pt sitting EOB with wife present. No c/o pain, requesting to shower.  Pt ambulated to bathroom, stand to sit at shower bench with close supervision.  Bathed and dressed UB and LB seated at shower bench with supervision.  Pt then ambulated approximately 50 feet to ADL bathroom and practiced tub transfer step over with and without grab bar use.  Pt ambulated back to room needing intermittent CGA due to decreased clearance of right foot during gait stride.  Stand to sit at EOB and sit to supine with distant supervision. Educated pt and wife on benefits and recommendation of use of shower chair, grab bar installation, and portable shower head use. Also discussed removal of area rugs in home setting for fall prevention and wife/pt agreeable. Call bell in reach, bed alarm on.    Therapy Documentation Precautions:  Precautions Precautions: Fall Precaution Comments: loop recorder placement-keep site dry Restrictions Weight Bearing Restrictions: No   Therapy/Group: Individual Therapy  Amie Critchley 04/08/2021, 3:45 PM

## 2021-04-08 NOTE — Progress Notes (Signed)
PROGRESS NOTE   Subjective/Complaints:  Discussed d/c date , wife is at bedside , no breathing issues , no leg swelling   ROS: denies CP except around loop , SOB, N/V/D bowels and bladder ok   Objective:   EP PPM/ICD IMPLANT  Result Date: 04/06/2021 CONCLUSIONS:  1. Successful implantation of a [employer] Reveal LINQ implantable loop recorder for cryptogenic stroke  2. No early apparent complications. Lewayne Bunting, MD 04/06/2021 6:05 PM   Recent Labs    04/06/21 2005 04/07/21 0509  WBC 8.0 9.6  HGB 15.0 14.5  HCT 42.8 41.9  PLT 229 232   Recent Labs    04/07/21 0509 04/08/21 0533  NA 135 136  K 4.0 4.1  CL 103 100  CO2 24 26  GLUCOSE 105* 103*  BUN 26* 28*  CREATININE 1.91* 1.77*  CALCIUM 8.8* 9.3    Intake/Output Summary (Last 24 hours) at 04/08/2021 0756 Last data filed at 04/07/2021 1838 Gross per 24 hour  Intake 820 ml  Output 1 ml  Net 819 ml        Physical Exam: Vital Signs Blood pressure (!) 148/62, pulse 60, temperature 97.7 F (36.5 C), temperature source Oral, resp. rate 18, height 5\' 6"  (1.676 m), weight 89.5 kg, SpO2 95 %.  General: No acute distress Mood and affect are appropriate Heart: Regular rate and rhythm no rubs murmurs or extra sounds Lungs: Clear to auscultation, breathing unlabored, no rales or wheezes Abdomen: Positive bowel sounds, soft nontender to palpation, nondistended Extremities: No clubbing, cyanosis, or edema Skin: No evidence of breakdown, no evidence of rash   Neurological:  Comments: Patient is alert in no acute distress. Makes eye contact with examiner. Provides name and age. Follows simple commands. Mild dysarthria. Fair insight and awareness. Mild right limb ataxia with past-pointing. Strength 4/5 RUE and RLE and 4+ LUE and LLE. No sensory changes. DTR's 1+   Assessment/Plan: 1. Functional deficits which require 3+ hours per day of interdisciplinary  therapy in a comprehensive inpatient rehab setting.  Physiatrist is providing close team supervision and 24 hour management of active medical problems listed below.  Physiatrist and rehab team continue to assess barriers to discharge/monitor patient progress toward functional and medical goals  Care Tool:  Bathing    Body parts bathed by patient: Right arm,Right lower leg,Left arm,Left lower leg,Chest,Face,Abdomen,Front perineal area,Buttocks,Right upper leg,Left upper leg         Bathing assist Assist Level: Supervision/Verbal cueing     Upper Body Dressing/Undressing Upper body dressing   What is the patient wearing?: Pull over shirt    Upper body assist Assist Level: Supervision/Verbal cueing    Lower Body Dressing/Undressing Lower body dressing      What is the patient wearing?: Pants,Underwear/pull up     Lower body assist Assist for lower body dressing: Supervision/Verbal cueing     Toileting Toileting    Toileting assist Assist for toileting: Contact Guard/Touching assist     Transfers Chair/bed transfer  Transfers assist     Chair/bed transfer assist level: Contact Guard/Touching assist     Locomotion Ambulation   Ambulation assist      Assist level: Contact Guard/Touching assist  Assistive device: No Device Max distance: 222ft   Walk 10 feet activity   Assist     Assist level: Contact Guard/Touching assist Assistive device: No Device   Walk 50 feet activity   Assist    Assist level: Contact Guard/Touching assist      Walk 150 feet activity   Assist    Assist level: Contact Guard/Touching assist Assistive device: No Device    Walk 10 feet on uneven surface  activity   Assist     Assist level: Contact Guard/Touching assist     Wheelchair     Assist Will patient use wheelchair at discharge?: No   Wheelchair activity did not occur: N/A         Wheelchair 50 feet with 2 turns activity    Assist     Wheelchair 50 feet with 2 turns activity did not occur: N/A       Wheelchair 150 feet activity     Assist  Wheelchair 150 feet activity did not occur: N/A       Blood pressure (!) 148/62, pulse 60, temperature 97.7 F (36.5 C), temperature source Oral, resp. rate 18, height 5\' 6"  (1.676 m), weight 89.5 kg, SpO2 95 %.  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 No hemorrhage or mass-effect. Status post loop recorder. -patient may shower -ELOS/Goals: 04/11/21 mod I to supervision with PT and OT and mod I with SLP.   2. Antithrombotics: -DVT/anticoagulation:d/c Subcutaneous Lovenox since ambulating 200 feet -antiplatelet therapy: Aspirin 81 mg daily and Plavix 75 mg daily x3 weeks then aspirin alone 3. Pain Management:Tylenol as needed 4. Mood:Provide emotional support -antipsychotic agents: N/A 5. Neuropsych: This patientiscapable of making decisions on hisown behalf. 6. Skin/Wound Care:Bleeding from loop recorder site: d/ce lovenox.  7. Fluids/Electrolytes/Nutrition:Routine in and outs with follow-up chemistrieson admit 8. Hyperlipidemia. Lipitor 9. CKD stage III. Admission creatinine 1.83. Up to 1.91 on 6/7, monitor weekly.  10. BPH. Flomax 0.8 mg daily. Check PVR's             -up to toilet to void 11. History of cardiomyopathy. Continue Ranexa 1000 mg daily. No chest pain or shortness of breath -check weights weekly No clinical symptoms of failure 12. OSA. CPAP.    LOS: 2 days A FACE TO FACE EVALUATION WAS PERFORMED  8/7 04/08/2021, 7:56 AM

## 2021-04-08 NOTE — Progress Notes (Signed)
Physical Therapy Session Note  Patient Details  Name: Bryan Crane MRN: [id number] Date of Birth: 07-Nov-1945  Today's Date: 04/08/2021 PT Individual Time: 0800-0900 PT Individual Time Calculation (min): 60 min   Short Term Goals: Week 1:  PT Short Term Goal 1 (Week 1): STG = LTG due to ELOS  Skilled Therapeutic Interventions/Progress Updates:     Pt greeted supine in bed to start PT Tx. No reports of pain. Wife at bedside. Pt motivated to begin therapy session. RN arriving for morning medications. Bed mobility completed with supervision with HOB elevated. Pt requesting to use bathroom, dress, and get ready for the day. Sit<>stand with distant supervision and no AD. Completed oral care and ADLs sinkside while standing with distant supervision. Able to complete lower body dressing in standing with CGA for balance as he was doing single leg stance to remove/don underwear - educated to sit down to perform these tasks to reduce falls risk. He ambulated in room with supervision and was continent of bladder while standing with distant supervision. Next, pt ambulated from his room to day room rehab gym with supervision and no AD. Completed Nustep for 2x3 minutes (rest break needed) for "warm up" with workload at 6, fading to 3, using BLE's only. Completed obstalce coure training with figure 8 weaving through cones and stepping over small and large hurddles - CGA provided for safety. Pt with a few missteps while stepping over large obstacles however all LOB were self corrected and technique improved with repetition. Next, he worked on standing balance on compliant surface with blue foam pad - required CGA for balance as he worked on e/o and e/c, head turns and chop lift with 3kg med ball - all while standing on foam. Pt ambulated back to his room with CGA and no AD (CGA needed due to BLE fatigue - noted pt to reach for hand rails in hallways). Pt ended session seated EOB with wife at bedside who was updated  on pt's progress and mobility - wife appreciative.  Therapy Documentation Precautions:  Precautions Precautions: Fall Precaution Comments: loop recorder placement-keep site dry Restrictions Weight Bearing Restrictions: No General:    Therapy/Group: Individual Therapy  Jarvis Sawa P Patrik Turnbaugh PT 04/08/2021, 7:40 AM

## 2021-04-08 NOTE — Progress Notes (Signed)
Patient ID: Bryan Crane, male   DOB: 20-Apr-1946, 75 y.o.   MRN: [id number] Met with the patient and his wife to introduce self and the role of the nurse CM. Reviewed secondary stroke risks including HTN and HLD (LDL 114, Trig 169) Reviewed DASH diet and dietary modifications for dyslipidemia with addition of Lipitor. Also reviewed DASPT x 3 weeks then ASA solo per MD. Continue to follow along to discharge to address educational needs. Collaborate with the SW to facilitate preparation for discharge. Margarito Liner, RN

## 2021-04-08 NOTE — Discharge Instructions (Addendum)
Inpatient Rehab Discharge Instructions  Bryan Crane Discharge date and time: No discharge date for patient encounter.   Activities/Precautions/ Functional Status: Activity: activity as tolerated Diet: regular diet Wound Care: Routine skin checks Functional status:  ___ No restrictions     ___ Walk up steps independently ___ 24/7 supervision/assistance   ___ Walk up steps with assistance ___ Intermittent supervision/assistance  ___ Bathe/dress independently ___ Walk with walker     _x__ Bathe/dress with assistance ___ Walk Independently    ___ Shower independently ___ Walk with assistance    ___ Shower with assistance ___ No alcohol     ___ Return to work/school ________  Special Instructions: No driving smoking or alcohol  Continue aspirin 81 mg daily and Plavix 75 mg daily x 2 more weeks then aspirin alone    COMMUNITY REFERRALS UPON DISCHARGE:    Outpatient: PT             Agency:[facility] [place] Phone: [phone]             Appointment Date/Time:WILL CALL WIFE TO SET UP APPOINTMENT  Medical Equipment/Items Ordered:WILL GET TUB SEAT ON OWN                                                 Agency/Supplier:NA  STROKE/TIA DISCHARGE INSTRUCTIONS SMOKING Cigarette smoking nearly doubles your risk of having a stroke & is the single most alterable risk factor  If you smoke or have smoked in the last 12 months, you are advised to quit smoking for your health. Most of the excess cardiovascular risk related to smoking disappears within a year of stopping. Ask you doctor about anti-smoking medications [place] Quit Line: 1-800-QUIT NOW Free Smoking Cessation Classes [phone]  CHOLESTEROL Know your levels; limit fat & cholesterol in your diet  Lipid Panel     Component Value Date/Time   CHOL 187 04/04/2021 0649   TRIG 167 (H) 04/04/2021 0649   HDL 40 (L) 04/04/2021 0649   CHOLHDL 4.7 04/04/2021 0649   VLDL 33 04/04/2021 0649   LDLCALC 114 (H)  04/04/2021 0649     Many patients benefit from treatment even if their cholesterol is at goal. Goal: Total Cholesterol (CHOL) less than 160 Goal:  Triglycerides (TRIG) less than 150 Goal:  HDL greater than 40 Goal:  LDL (LDLCALC) less than 100   BLOOD PRESSURE American Stroke Association blood pressure target is less that 120/80 mm/Hg  Your discharge blood pressure is:  BP: (!) 151/81 Monitor your blood pressure Limit your salt and alcohol intake Many individuals will require more than one medication for high blood pressure  DIABETES (A1c is a blood sugar average for last 3 months) Goal HGBA1c is under 7% (HBGA1c is blood sugar average for last 3 months)  Diabetes: No known diagnosis of diabetes    Lab Results  Component Value Date   HGBA1C 5.9 (H) 04/06/2021    Your HGBA1c can be lowered with medications, healthy diet, and exercise. Check your blood sugar as directed by your physician Call your physician if you experience unexplained or low blood sugars.  PHYSICAL ACTIVITY/REHABILITATION Goal is 30 minutes at least 4 days per week  Activity: Increase activity slowly, Therapies: Physical Therapy: Home Health Return to work:  Activity decreases your risk of heart attack and stroke and makes your heart stronger.  It  helps control your weight and blood pressure; helps you relax and can improve your mood. Participate in a regular exercise program. Talk with your doctor about the best form of exercise for you (dancing, walking, swimming, cycling).  DIET/WEIGHT Goal is to maintain a healthy weight  Your discharge diet is:  Diet Order             Diet Heart Room service appropriate? Yes; Fluid consistency: Thin  Diet effective now                   liquids Your height is:  Height: 5\' 6"  (167.6 cm) Your current weight is: Weight: 89.5 kg Your Body Mass Index (BMI) is:  BMI (Calculated): 31.86 Following the type of diet specifically designed for you will help prevent another  stroke. Your goal weight range is:   Your goal Body Mass Index (BMI) is 19-24. Healthy food habits can help reduce 3 risk factors for stroke:  High cholesterol, hypertension, and excess weight.  RESOURCES Stroke/Support Group:  Call [phone]   STROKE EDUCATION PROVIDED/REVIEWED AND GIVEN TO PATIENT Stroke warning signs and symptoms How to activate emergency medical system (call 911). Medications prescribed at discharge. Need for follow-up after discharge. Personal risk factors for stroke. Pneumonia vaccine given:  Flu vaccine given:  My questions have been answered, the writing is legible, and I understand these instructions.  I will adhere to these goals & educational materials that have been provided to me after my discharge from the hospital.    RETURN TO DRIVING PLAN:   WITH THE SUPERVISION OF A LICENSED DRIVER, PLEASE DRIVE IN AN EMPTY PARKING LOT FOR AT LEAST 2-3 TRIALS TO TEST REACTION TIME, VISION, USE OF EQUIPMENT IN CAR, ETC.   IF SUCCESSFUL WITH THE PARKING LOT DRIVING, PROCEED TO SUPERVISED DRIVING TRIALS IN YOUR NEIGHBORHOOD STREETS AT LOW TRAFFIC TIMES TO TEST OBSERVATION TO TRAFFIC SIGNALS, REACTION TIME, ETC. PLEASE ATTEMPT AT LEAST 2-3 TRIALS IN YOUR NEIGHBORHOOD.   IF NEIGHBORHOOD DRIVING IS SUCCESSFUL, YOU MAY PROCEED TO DRIVING IN BUSIER AREAS IN YOUR COMMUNITY WITH SUPERVISION OF A LICENSED DRIVER. PLEASE ATTEMPT AT LEAST 4-5 TRIALS.   IF COMMUNITY DRIVING IS SUCCESSFUL, YOU MAY PROCEED TO DRIVING ALONE, DURING THE DAY TIME, IN NON-PEAK TRAFFIC TIMES. YOU SHOULD DRIVE NO FURTHER THAN 20 MINUTES IN ONE DIRECTION. PLEASE DO NOT DRIVE IF YOU FEEL FATIGUED OR UNDER THE INFLUENCE OF MEDICATION.       My questions have been answered and I understand these instructions. I will adhere to these goals and the provided educational materials after my discharge from the hospital.  Patient/Caregiver Signature _______________________________ Date __________  Clinician  Signature _______________________________________ Date __________  Please bring this form and your medication list with you to all your follow-up doctor's appointments.

## 2021-04-08 NOTE — Progress Notes (Signed)
Patient ID: Bryan Crane, male   DOB: [birth year], 75 y.o.   MRN: [id number] Team feels pt will be ready for discharge on Friday 6/10. Speech is not picking him up will address any discharge needs.

## 2021-04-09 ENCOUNTER — Other Ambulatory Visit (HOSPITAL_COMMUNITY): Payer: Self-pay

## 2021-04-09 MED ORDER — PANTOPRAZOLE SODIUM 40 MG PO TBEC
40.0000 mg | DELAYED_RELEASE_TABLET | Freq: Every day | ORAL | 0 refills | Status: DC
  Filled 2021-04-09: qty 30, 30d supply, fill #0

## 2021-04-09 MED ORDER — ATORVASTATIN CALCIUM 40 MG PO TABS
40.0000 mg | ORAL_TABLET | Freq: Every day | ORAL | 0 refills | Status: DC
  Filled 2021-04-09: qty 30, 30d supply, fill #0

## 2021-04-09 MED ORDER — BRIMONIDINE TARTRATE 0.2 % OP SOLN
1.0000 [drp] | Freq: Three times a day (TID) | OPHTHALMIC | 12 refills | Status: DC
  Filled 2021-04-09: qty 5, 30d supply, fill #0

## 2021-04-09 MED ORDER — ACETAMINOPHEN 325 MG PO TABS: 650.0000 mg | ORAL_TABLET | ORAL | Status: AC | PRN

## 2021-04-09 MED ORDER — TAMSULOSIN HCL 0.4 MG PO CAPS
0.8000 mg | ORAL_CAPSULE | Freq: Every evening | ORAL | 0 refills | Status: AC
  Filled 2021-04-09: qty 30, 15d supply, fill #0

## 2021-04-09 MED ORDER — CLOPIDOGREL BISULFATE 75 MG PO TABS
75.0000 mg | ORAL_TABLET | Freq: Every day | ORAL | 0 refills | Status: DC
  Filled 2021-04-09: qty 14, 14d supply, fill #0

## 2021-04-09 MED ORDER — RANOLAZINE ER 1000 MG PO TB12
1000.0000 mg | ORAL_TABLET | Freq: Every day | ORAL | 0 refills | Status: AC
  Filled 2021-04-09: qty 30, 30d supply, fill #0

## 2021-04-09 NOTE — Progress Notes (Signed)
Occupational Therapy Session Note  Patient Details  Name: Bryan Crane MRN: [id number] Date of Birth: [birth year]  Today's Date: 04/09/2021 OT Individual Time: 0830-0930 OT Individual Time Calculation (min): 60 min    Short Term Goals: Week 1:  OT Short Term Goal 1 (Week 1): STGs=LTGs due to ELOS   Skilled Therapeutic Interventions/Progress Updates:    Pt received coming out of bathroom with RN present and wife present. Pt able to ambulate in and out of bathroom and toilet independently.  His wife stated she did not have any further questions from yesterdays OT session. She mentioned that they will probably install grab bars.  Had pt walk down to ADL apt so I could show him a suction cup grab bar as they have a plastic molded tub.  Explained use of the bar and precautions to ensure it is stable on the wall.   Pt ambulated to day room and stood for over 30 minutes while we talked about his past career and experiences.  Discussed with MD driving recommendations and walking outside recommendations.   He then worked on dynamic balance exercises of standing with squats 12x, torso twists 12x, cross body chops in each direction 12x for a total of 3 sets.  Pt then ambulated back to his room to meet up with his wife. Pt feels ready to go home tomorrow.   Therapy Documentation Precautions:  Precautions Precautions: Fall Precaution Comments: loop recorder placement-keep site dry Restrictions Weight Bearing Restrictions: No  Vital Signs: Therapy Vitals Temp: 97.9 F (36.6 C) Pulse Rate: 67 Resp: 18 BP: 132/84 Patient Position (if appropriate): Sitting Oxygen Therapy SpO2: 94 % O2 Device: Room Air Pain: Pain Assessment Pain Scale: 0-10 Pain Score: 0-No pain    Therapy/Group: Individual Therapy  Chara Marquard 04/09/2021, 8:29 AM

## 2021-04-09 NOTE — Progress Notes (Signed)
Inpatient Rehabilitation Care Coordinator Discharge Note  The overall goal for the admission was met for:   Discharge location: Yes-HOME WITH WIFE WHO CAN ASSIST WITH CARE  Length of Stay: Yes-4 DAYS  Discharge activity level: Yes-MOD/I LEVEL  Home/community participation: Yes  Services provided included: MD, RD, PT, OT, SLP, RN, CM, Pharmacy, and SW  Financial Services: Medicare and Private Insurance: [place] offered to/list presented to:PT AND WIFE  Follow-up services arranged: Outpatient: [facility] [place] WILL CALL WIFE TO ARRANGE APPOINTMENTS  Comments (or additional information):WIFE STAYED HERE WITH PT AND PARTICIPATED IN THERAPIES WITH HIM. HE RECOVERED QUICKLY AND WAS ABLE TO RETURN HOME  Patient/Family verbalized understanding of follow-up arrangements: Yes  Individual responsible for coordination of the follow-up plan: DRUCILLA-WIFE [phone]  Confirmed correct DME delivered: Elease Hashimoto 04/09/2021    Charish Schroepfer, Gardiner Rhyme

## 2021-04-09 NOTE — IPOC Note (Signed)
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     Functional Problem List: Nursing Bowel, Endurance, Pain, Medication Management, Safety, Bladder  PT Balance, Endurance, Motor  OT Motor, Endurance  SLP  (N/A)  TR         Basic ADL's: OT Grooming, Bathing, Dressing, Toileting     Advanced  ADL's: OT       Transfers: PT Bed Mobility, Bed to Chair, Car  OT Toilet, Tub/Shower     Locomotion: PT Ambulation     Additional Impairments: OT    SLP None      TR      Anticipated Outcomes Item Anticipated Outcome  Self Feeding    Swallowing      Basic self-care  mod I  Toileting  mod I   Bathroom Transfers mod I  Bowel/Bladder  Manage bowel and bladder with mod I assist  Transfers  mod I with LRAD  Locomotion  mod I with LRAD  Communication     Cognition     Pain  pain at or below level 4  Safety/Judgment  maintain safety with cues/reminders   Therapy Plan: PT Intensity: Minimum of 1-2 x/day ,45 to 90 minutes PT Frequency: 5 out of 7 days PT Duration Estimated Length of Stay: 5 days OT Intensity: Minimum of 1-2 x/day, 45 to 90 minutes OT Frequency: 5 out of 7 days OT Duration/Estimated Length of Stay: 5-7 days SLP Intensity:  (N/A) SLP Frequency:  (N/A) SLP Duration/Estimated Length of Stay: N/A   Due to the current state of emergency, patients may not be receiving their 3-hours of Medicare-mandated therapy.   Team Interventions: Nursing Interventions Disease Management/Prevention, Medication Management, Discharge Planning, Pain Management, Bowel Management, Bladder Management  PT interventions Discharge planning, Ambulation/gait training, Functional mobility training, Psychosocial support, Visual/perceptual  remediation/compensation, Therapeutic Activities, Therapeutic Exercise, Wheelchair propulsion/positioning, Skin care/wound management, Neuromuscular re-education, Disease management/prevention, Warden/ranger, Cognitive remediation/compensation, DME/adaptive equipment instruction, Pain management, Splinting/orthotics, UE/LE Strength taining/ROM, Stair training, UE/LE Coordination activities, Patient/family education, Community reintegration  OT Interventions Warden/ranger, Discharge planning, Self Care/advanced ADL retraining, Therapeutic Activities, UE/LE Coordination activities, Disease mangement/prevention, Functional mobility training, Patient/family education, Therapeutic Exercise, DME/adaptive equipment instruction, Neuromuscular re-education, Psychosocial support, UE/LE Strength taining/ROM  SLP Interventions    TR Interventions    SW/CM Interventions Discharge Planning, Psychosocial Support, Patient/Family Education   Barriers to Discharge MD  Medical stability  Nursing Decreased caregiver support, Home environment access/layout 1 level 3 ste bil rails with wife  PT Insurance for SNF coverage    OT      SLP      SW       Team Discharge Planning: Destination: PT-Home ,OT- Home , SLP-Home Projected Follow-up: PT-Outpatient PT, 24 hour supervision/assistance, OT-  None, SLP-None Projected Equipment Needs: PT-To be determined, OT- Tub/shower seat, SLP-None recommended by SLP Equipment Details: PT-Pt owns canes, OT-  Patient/family involved in discharge planning: PT- Patient, Family Adult nurse,  OT-Patient, SLP-Patient, Family member/caregiver  MD ELOS: 5-7 days Medical Rehab Prognosis:  Excellent Assessment: Bryan Crane is a 75 year old man who is admitted to CIR for dizziness/slurred speech with limb and truncal ataxia secondary to small infarct of the anterior superior right cerebellum.  No hemorrhage or mass-effect.  Status post loop recorder.  Active medical issues include CKD III and OSA.  Labs and vitals are being monitored regularly.    See Team Conference Notes for weekly updates to the plan of care

## 2021-04-09 NOTE — Progress Notes (Signed)
PROGRESS NOTE   Subjective/Complaints: No complaints this morning Ready for d/c tomorrow Asks whether he can return to driving.  Cr improved  ROS: denies CP except around loop , SOB, N/V/D bowels and bladder ok   Objective:   No results found.  Recent Labs    04/06/21 2005 04/07/21 0509  WBC 8.0 9.6  HGB 15.0 14.5  HCT 42.8 41.9  PLT 229 232   Recent Labs    04/07/21 0509 04/08/21 0533  NA 135 136  K 4.0 4.1  CL 103 100  CO2 24 26  GLUCOSE 105* 103*  BUN 26* 28*  CREATININE 1.91* 1.77*  CALCIUM 8.8* 9.3    Intake/Output Summary (Last 24 hours) at 04/09/2021 1415 Last data filed at 04/09/2021 1322 Gross per 24 hour  Intake 960 ml  Output --  Net 960 ml        Physical Exam: Vital Signs Blood pressure (!) 147/82, pulse 62, temperature 97.8 F (36.6 C), temperature source Oral, resp. rate 15, height 5\' 6"  (1.676 m), weight 89.5 kg, SpO2 100 %. Gen: no distress, normal appearing HEENT: oral mucosa pink and moist, NCAT Cardio: Reg rate Chest: normal effort, normal rate of breathing Abd: soft, non-distended Ext: no edema Psych: pleasant, normal affect Skin: intact  Neurological:     Comments: Patient is alert in no acute distress.  Makes eye contact with examiner.  Provides name and age.  Follows simple commands. Mild dysarthria.  Fair insight and awareness. Mild right limb ataxia with past-pointing. Strength 4/5 RUE and RLE and 4+ LUE and LLE. No sensory changes. DTR's 1+     Assessment/Plan: 1. Functional deficits which require 3+ hours per day of interdisciplinary therapy in a comprehensive inpatient rehab setting. Physiatrist is providing close team supervision and 24 hour management of active medical problems listed below. Physiatrist and rehab team continue to assess barriers to discharge/monitor patient progress toward functional and medical goals  Care Tool:  Bathing    Body parts bathed  by patient: Right arm, Right lower leg, Left arm, Left lower leg, Chest, Face, Abdomen, Front perineal area, Buttocks, Right upper leg, Left upper leg         Bathing assist Assist Level: Supervision/Verbal cueing     Upper Body Dressing/Undressing Upper body dressing   What is the patient wearing?: Pull over shirt    Upper body assist Assist Level: Supervision/Verbal cueing    Lower Body Dressing/Undressing Lower body dressing      What is the patient wearing?: Pants, Underwear/pull up     Lower body assist Assist for lower body dressing: Supervision/Verbal cueing     Toileting Toileting    Toileting assist Assist for toileting: Independent     Transfers Chair/bed transfer  Transfers assist     Chair/bed transfer assist level: Independent     Locomotion Ambulation   Ambulation assist      Assist level: Independent Assistive device: No Device Max distance: 243ft   Walk 10 feet activity   Assist     Assist level: Independent Assistive device: No Device   Walk 50 feet activity   Assist    Assist level: Independent Assistive device:  No Device    Walk 150 feet activity   Assist    Assist level: Independent Assistive device: No Device    Walk 10 feet on uneven surface  activity   Assist     Assist level: Supervision/Verbal cueing     Wheelchair     Assist Will patient use wheelchair at discharge?: No   Wheelchair activity did not occur: N/A         Wheelchair 50 feet with 2 turns activity    Assist    Wheelchair 50 feet with 2 turns activity did not occur: N/A       Wheelchair 150 feet activity     Assist  Wheelchair 150 feet activity did not occur: N/A       Blood pressure (!) 147/82, pulse 62, temperature 97.8 F (36.6 C), temperature source Oral, resp. rate 15, height 5\' 6"  (1.676 m), weight 89.5 kg, SpO2 100 %.  Medical Problem List and Plan: 1.  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  No hemorrhage or mass-effect.  Status post loop recorder.             -patient may  shower             -ELOS/Goals: 04/11/21 mod I to supervision with PT and OT and mod I with SLP.  D/c tomorrow.   Discussed RETURN TO DRIVING PLAN:  WITH THE SUPERVISION OF A LICENSED DRIVER, PLEASE DRIVE IN AN EMPTY PARKING LOT FOR AT LEAST 2-3 TRIALS TO TEST REACTION TIME, VISION, USE OF EQUIPMENT IN CAR, ETC.  IF SUCCESSFUL WITH THE PARKING LOT DRIVING, PROCEED TO SUPERVISED DRIVING TRIALS IN YOUR NEIGHBORHOOD STREETS AT LOW TRAFFIC TIMES TO TEST OBSERVATION TO TRAFFIC SIGNALS, REACTION TIME, ETC. PLEASE ATTEMPT AT LEAST 2-3 TRIALS IN YOUR NEIGHBORHOOD.  IF NEIGHBORHOOD DRIVING IS SUCCESSFUL, YOU MAY PROCEED TO DRIVING IN BUSIER AREAS IN YOUR COMMUNITY WITH SUPERVISION OF A LICENSED DRIVER. PLEASE ATTEMPT AT LEAST 4-5 TRIALS.  2.  Antithrombotics: -DVT/anticoagulation: d/c Subcutaneous Lovenox since ambulating 200 feet             -antiplatelet therapy: Aspirin 81 mg daily and Plavix 75 mg daily x3 weeks then aspirin alone 3. Pain Management: Tylenol as needed 4. Mood: Provide emotional support             -antipsychotic agents: N/A 5. Neuropsych: This patient is capable of making decisions on his own behalf. 6. Skin/Wound Care: Bleeding from loop recorder site: d/ce lovenox.  7. Fluids/Electrolytes/Nutrition: Routine in and outs with follow-up chemistries on admit 8.  Hyperlipidemia.  Lipitor 9.  CKD stage III.  Admission creatinine 1.83. Up to 1.91 on 6/7, improved to 1.77- discussed with patient and wife 10.  BPH.  Flomax 0.8 mg daily.  Check PVR's             -up to toilet to void 11.  History of cardiomyopathy.  Continue Ranexa 1000 mg daily.  No chest pain or shortness of breath             -check weights weekly No clinical symptoms of failure 12.  OSA.  CPAP. 13. Cough: recommended honey, hot water and ginger at home:  discussed with patient and wife.      LOS: 3 days A FACE TO FACE EVALUATION WAS PERFORMED  Davidson Palmieri P Admiral Marcucci 04/09/2021, 2:15 PM

## 2021-04-09 NOTE — Progress Notes (Addendum)
Occupational Therapy Discharge Summary  Patient Details  Name: Bryan Crane MRN: [id number] Date of Birth: 06-23-1946  Today's Date: 04/09/2021 OT Individual Time: 1303-1405 OT Individual Time Calculation (min): 62 min    Patient has met 5 of 5 long term goals due to improved activity tolerance, improved balance, ability to compensate for deficits, improved attention, improved awareness, and improved coordination.  Patient to discharge at overall Modified Independent level.  Patient's care partner is independent to provide the necessary physical assistance at discharge.    Recommendation:  Patient does not need further skilled OT at this time.  Equipment: No equipment provided. Recommendation made for procurement of shower chair.  Reasons for discharge: treatment goals met and discharge from hospital  Patient/family agrees with progress made and goals achieved: Yes  Skilled Treatment:  Pt sitting at EOB, wife present throughout session.  Assessed pts independence level during self care and functional mobility within room.  Pt exhibited modified independence with all self care including dressing, bathing, toileting, and oral hygiene per below as well as functional mobility without use of AE.  No LOB noted throughout. Discussed fall prevention measures in bathroom including use of anti-slip shower mat, drying self and floor surfaces prior to transferring out of tub.  Pt and wife receptive and verbalize good understanding. Collaborated with PT and determined pt appropriate for modified independent status within room until discharge; notified nursing of status.  Pt sitting EOB with call bell in reach at end of session.    OT Discharge Precautions/Restrictions  Precautions Precautions: Fall Precaution Comments: loop recorder placement-keep site dry Restrictions Weight Bearing Restrictions: No  Vital Signs Therapy Vitals Temp: 97.8 F (36.6 C) Temp Source: Oral Pulse Rate: 62 Resp:  15 BP: (!) 147/82 Patient Position (if appropriate): Sitting Oxygen Therapy SpO2: 100 % O2 Device: Room Air Pain Pain Assessment Pain Scale: 0-10 Pain Score: 0-No pain ADL ADL Grooming: Independent Where Assessed-Grooming: Standing at sink Upper Body Bathing: Modified independent Where Assessed-Upper Body Bathing: Shower Lower Body Bathing: Modified independent Where Assessed-Lower Body Bathing: Shower Upper Body Dressing: Independent Where Assessed-Upper Body Dressing: Edge of bed Lower Body Dressing: Modified independent Where Assessed-Lower Body Dressing: Other (Comment) (sitting/standing at shower bench) Toileting: Independent Where Assessed-Toileting: Glass blower/designer: Programmer, applications Method: Human resources officer: Modified independent Clinical cytogeneticist Method: Clinical biochemist Method: Ambulating Vision Baseline Vision/History: No visual deficits Patient Visual Report: No change from baseline Vision Assessment?: No apparent visual deficits Eye Alignment: Within Functional Limits Ocular Range of Motion: Within Functional Limits Alignment/Gaze Preference: Within Defined Limits Tracking/Visual Pursuits: Able to track stimulus in all quads without difficulty Perception  Perception: Within Functional Limits Praxis Praxis: Intact Cognition Overall Cognitive Status: Within Functional Limits for tasks assessed Arousal/Alertness: Awake/alert Orientation Level: Oriented X4 Attention: Alternating;Selective;Sustained Focused Attention: Appears intact Sustained Attention: Appears intact Selective Attention: Appears intact Alternating Attention: Appears intact Memory: Appears intact Memory Recall Sock: Without Cue Memory Recall Blue: Without Cue Memory Recall Bed: Without Cue Awareness: Appears intact Problem Solving: Appears intact Organizing: Appears intact Self Monitoring:  Appears intact Safety/Judgment: Appears intact Sensation Sensation Light Touch: Appears Intact Hot/Cold: Appears Intact Proprioception: Appears Intact Stereognosis: Appears Intact Coordination Gross Motor Movements are Fluid and Coordinated: Yes Fine Motor Movements are Fluid and Coordinated: Yes Coordination and Movement Description: Motor movement patterns limited mostly be general deconditioning Finger Nose Finger Test: WNL BUE Motor  Motor Motor: Within Functional Limits Mobility  Bed Mobility Bed Mobility: Supine to  Sit;Sit to Supine Right Sidelying to Sit: Independent Supine to Sit: Independent Sit to Supine: Independent Transfers Sit to Stand: Independent Stand to Sit: Independent  Trunk/Postural Assessment  Cervical Assessment Cervical Assessment: Within Functional Limits Thoracic Assessment Thoracic Assessment: Within Functional Limits Lumbar Assessment Lumbar Assessment: Within Functional Limits Postural Control Postural Control: Within Functional Limits  Balance Balance Balance Assessed: Yes Static Sitting Balance Static Sitting - Balance Support: No upper extremity supported;Feet supported Static Sitting - Level of Assistance: 7: Independent Dynamic Sitting Balance Dynamic Sitting - Balance Support: During functional activity;Feet supported Dynamic Sitting - Level of Assistance: 7: Independent Static Standing Balance Static Standing - Level of Assistance: 7: Independent Dynamic Standing Balance Dynamic Standing - Level of Assistance: 6: Modified independent (Device/Increase time) Extremity/Trunk Assessment RUE Assessment RUE Assessment: Within Functional Limits LUE Assessment LUE Assessment: Within Functional Limits   Ezekiel Slocumb 04/09/2021, 2:48 PM

## 2021-04-09 NOTE — Progress Notes (Addendum)
Pt feels comfortable with education on diet and nutrition for stroke. Reminded and discussed with pt about materials in pt binder. Answered all questions patient had. No further questions.  Mylo Red, LPN

## 2021-04-09 NOTE — Discharge Summary (Signed)
Physical Therapy Discharge Summary  Patient Details  Name: Bryan Crane MRN: [id number] Date of Birth: 08/21/1946  Today's Date: 04/09/2021 PT Individual Time: 9470-9628 PT Individual Time Calculation (min): 59 min    Patient has met 8 of 8 long term goals due to improved activity tolerance, improved balance, increased strength, and improved coordination.  Patient to discharge at an ambulatory level Modified Independent.   Patient's care partner is independent to provide the necessary physical and cognitive assistance at discharge.  Reasons goals not met: n/a  Recommendation:  Patient will benefit from ongoing skilled PT services in outpatient setting to continue to advance safe functional mobility, address ongoing impairments in dynamic balance, global deconditioning, and higher level gait deficits in order to minimize fall risk.  Equipment: No equipment provided  Reasons for discharge: treatment goals met and discharge from hospital  Patient/family agrees with progress made and goals achieved: Yes  PT Discharge Precautions/Restrictions Precautions Precautions: Fall Precaution Comments: loop recorder placement-keep site dry Restrictions Weight Bearing Restrictions: No Vision/Perception  Vision - Assessment Eye Alignment: Within Functional Limits Ocular Range of Motion: Within Functional Limits Alignment/Gaze Preference: Within Defined Limits Tracking/Visual Pursuits: Able to track stimulus in all quads without difficulty Perception Perception: Within Functional Limits Praxis Praxis: Intact  Cognition Overall Cognitive Status: Within Functional Limits for tasks assessed Arousal/Alertness: Awake/alert Orientation Level: Oriented X4 Attention: Alternating;Selective Focused Attention: Appears intact Selective Attention: Appears intact Alternating Attention: Appears intact Memory: Appears intact Awareness: Appears intact Problem Solving: Appears intact Self  Monitoring: Appears intact Safety/Judgment: Appears intact Sensation Sensation Light Touch: Appears Intact Hot/Cold: Appears Intact Proprioception: Appears Intact Stereognosis: Appears Intact Coordination Gross Motor Movements are Fluid and Coordinated: Yes Fine Motor Movements are Fluid and Coordinated: Yes Coordination and Movement Description: Motor movement patterns limited mostly be general deconditioning Finger Nose Finger Test: WNL BUE Heel Shin Test: WNL Motor  Motor Motor: Within Functional Limits  Mobility Bed Mobility Bed Mobility: Supine to Sit;Sit to Supine Right Sidelying to Sit: Independent Supine to Sit: Independent Sit to Supine: Independent Transfers Transfers: Sit to Stand;Stand to [employer] Transfers Sit to Stand: Independent Stand to Sit: Independent Stand Pivot Transfers: Independent Transfer (Assistive device): None Locomotion  Gait Ambulation: Yes Gait Assistance: Independent Gait Distance (Feet): 250 Feet Assistive device: None Gait Gait: Yes Gait Pattern: Within Functional Limits Stairs / Additional Locomotion Stairs: Yes Stairs Assistance: Supervision/Verbal cueing Stair Management Technique: Two rails Number of Stairs: 12 Height of Stairs: 6 Wheelchair Mobility Wheelchair Mobility: No  Trunk/Postural Assessment  Cervical Assessment Cervical Assessment: Within Functional Limits Thoracic Assessment Thoracic Assessment: Within Functional Limits Lumbar Assessment Lumbar Assessment: Within Functional Limits Postural Control Postural Control: Within Functional Limits  Balance Balance Balance Assessed: Yes Static Sitting Balance Static Sitting - Balance Support: No upper extremity supported;Feet supported Static Sitting - Level of Assistance: 7: Independent Dynamic Sitting Balance Dynamic Sitting - Balance Support: During functional activity;Feet supported Dynamic Sitting - Level of Assistance: 7: Independent Static Standing  Balance Static Standing - Balance Support: No upper extremity supported Static Standing - Level of Assistance: 7: Independent Dynamic Standing Balance Dynamic Standing - Balance Support: During functional activity;Bilateral upper extremity supported Dynamic Standing - Level of Assistance: 6: Modified independent (Device/Increase time) Dynamic Standing - Balance Activities: Compliant surfaces Extremity Assessment  RLE Assessment RLE Assessment: Within Functional Limits General Strength Comments: Grossly 4+/5 LLE Assessment LLE Assessment: Within Functional Limits General Strength Comments: Grossly 4+/5  Skilled Intervention:  Pt in bathroom at start of session, continent of B &  B - charted in flowsheets. Initated functional mobility as outlined above. Pt indep with mobility including bed<>chair transfers, car transfers, bed mobility, and ambulation >270f. He is able to complete stairs with supervision and 2 hand rails.   Instructed on Nustep for 5 min with workload 3 using BLE's only as his "warm up." Pt able to maintain >60 steps/minute cadence and demonstrates improved cardio endurance compared to sessions prior.   Instructed on alternating step up/downs on 6inch step - progressions with BUE support -> unilateral UE support -> no UE support. 1x183m each. Supervision with UE support and CGA needed with no UE support.  Ball toss to re[employer] Johnsonn level ground with feet apart and feet together and supervision  Ball toss to rebounder on compliant blue air-ex foam pad with feet apart and CGA  Discussed DC planning, home safety training, role of f/u therapies, etc throughout session. Also discussed risk factors for stroke and lifestyle modifications to reduce his risk. Pt appreciative of education. He remained seated EOB at end of session with needs in reach and wife at bedside.   Travia Onstad P Rj Pedrosa PT DPT 04/09/2021, 7:47 AM

## 2021-04-10 NOTE — Progress Notes (Signed)
PROGRESS NOTE   Subjective/Complaints: Pt ready for d/c today- asked me to call Cardiologist- attempted x2 and texted him- haven't heard back.   [phone]- advised pt to use my chart to get ahold of medical records, however we will send d/c summary to Cards.    ROS:  Pt denies SOB, abd pain, CP, N/V/C/D, and vision changes   Objective:   No results found.  No results for input(s): WBC, HGB, HCT, PLT in the last 72 hours.  Recent Labs    04/08/21 0533  NA 136  K 4.1  CL 100  CO2 26  GLUCOSE 103*  BUN 28*  CREATININE 1.77*  CALCIUM 9.3    Intake/Output Summary (Last 24 hours) at 04/10/2021 0857 Last data filed at 04/09/2021 1815 Gross per 24 hour  Intake 780 ml  Output --  Net 780 ml        Physical Exam: Vital Signs Blood pressure 125/62, pulse (!) 55, temperature 98.2 F (36.8 C), resp. rate 18, height 5\' 6"  (1.676 m), weight 89.5 kg, SpO2 100 %.   General: awake, alert, appropriate, NAD HENT: conjugate gaze; oropharynx moist CV: regular but bradycardic rate; no JVD Pulmonary: CTA B/L; no W/R/R- good air movement GI: soft, NT, ND, (+)BS Psychiatric: appropriate- excited about d/c.  Neurological:     Comments: Patient is alert in no acute distress.  Makes eye contact with examiner.  Provides name and age.  Follows simple commands. Mild dysarthria.  Fair insight and awareness. Mild right limb ataxia with past-pointing. Strength 4/5 RUE and RLE and 4+ LUE and LLE. No sensory changes. DTR's 1+     Assessment/Plan: 1. Functional deficits which require 3+ hours per day of interdisciplinary therapy in a comprehensive inpatient rehab setting. Physiatrist is providing close team supervision and 24 hour management of active medical problems listed below. Physiatrist and rehab team continue to assess barriers to discharge/monitor patient progress toward functional and medical goals  Care Tool:  Bathing     Body parts bathed by patient: Right arm, Right lower leg, Left arm, Left lower leg, Chest, Face, Abdomen, Front perineal area, Buttocks, Right upper leg, Left upper leg         Bathing assist Assist Level: Independent with assistive device     Upper Body Dressing/Undressing Upper body dressing   What is the patient wearing?: Pull over shirt    Upper body assist Assist Level: Independent    Lower Body Dressing/Undressing Lower body dressing      What is the patient wearing?: Pants, Underwear/pull up     Lower body assist Assist for lower body dressing: Independent with assitive device     Toileting Toileting    Toileting assist Assist for toileting: Independent     Transfers Chair/bed transfer  Transfers assist     Chair/bed transfer assist level: Independent     Locomotion Ambulation   Ambulation assist      Assist level: Independent Assistive device: No Device Max distance: 236ft   Walk 10 feet activity   Assist     Assist level: Independent Assistive device: No Device   Walk 50 feet activity   Assist    Assist  level: Independent Assistive device: No Device    Walk 150 feet activity   Assist    Assist level: Independent Assistive device: No Device    Walk 10 feet on uneven surface  activity   Assist     Assist level: Supervision/Verbal cueing     Wheelchair     Assist Will patient use wheelchair at discharge?: No   Wheelchair activity did not occur: N/A         Wheelchair 50 feet with 2 turns activity    Assist    Wheelchair 50 feet with 2 turns activity did not occur: N/A       Wheelchair 150 feet activity     Assist  Wheelchair 150 feet activity did not occur: N/A       Blood pressure 125/62, pulse (!) 55, temperature 98.2 F (36.8 C), resp. rate 18, height 5\' 6"  (1.676 m), weight 89.5 kg, SpO2 100 %.  Medical Problem List and Plan: 1.  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  No hemorrhage or mass-effect.  Status post loop recorder.             -patient may  shower             -ELOS/Goals: 04/11/21 mod I to supervision with PT and OT and mod I with SLP.  D/c tomorrow.   Discussed RETURN TO DRIVING PLAN:  WITH THE SUPERVISION OF A LICENSED DRIVER, PLEASE DRIVE IN AN EMPTY PARKING LOT FOR AT LEAST 2-3 TRIALS TO TEST REACTION TIME, VISION, USE OF EQUIPMENT IN CAR, ETC.  IF SUCCESSFUL WITH THE PARKING LOT DRIVING, PROCEED TO SUPERVISED DRIVING TRIALS IN YOUR NEIGHBORHOOD STREETS AT LOW TRAFFIC TIMES TO TEST OBSERVATION TO TRAFFIC SIGNALS, REACTION TIME, ETC. PLEASE ATTEMPT AT LEAST 2-3 TRIALS IN YOUR NEIGHBORHOOD.  IF NEIGHBORHOOD DRIVING IS SUCCESSFUL, YOU MAY PROCEED TO DRIVING IN BUSIER AREAS IN YOUR COMMUNITY WITH SUPERVISION OF A LICENSED DRIVER. PLEASE ATTEMPT AT LEAST 4-5 TRIALS.  D/c today 04/10/21 2.  Antithrombotics: -DVT/anticoagulation: d/c Subcutaneous Lovenox since ambulating 200 feet             -antiplatelet therapy: Aspirin 81 mg daily and Plavix 75 mg daily x3 weeks then aspirin alone 3. Pain Management: Tylenol as needed 4. Mood: Provide emotional support             -antipsychotic agents: N/A 5. Neuropsych: This patient is capable of making decisions on his own behalf. 6. Skin/Wound Care: Bleeding from loop recorder site: d/ce lovenox.  7. Fluids/Electrolytes/Nutrition: Routine in and outs with follow-up chemistries on admit 8.  Hyperlipidemia.  Lipitor 9.  CKD stage III.  Admission creatinine 1.83. Up to 1.91 on 6/7, improved to 1.77- discussed with patient and wife 10.  BPH.  Flomax 0.8 mg daily.  Check PVR's             -up to toilet to void 11.  History of cardiomyopathy.  Continue Ranexa 1000 mg daily.  No chest pain or shortness of breath             -check weights weekly No clinical symptoms of failure 12.  OSA.  CPAP. 13. Cough: recommended honey, hot water and  ginger at home: discussed with patient and wife.  14. Dispo  6/10- have attempted x2 to reach cardiologist- called and texted- haven't heard anything- if they call back, will go over his chart- since I don't know pt myself-  covering.      LOS: 4 days A FACE TO FACE EVALUATION WAS PERFORMED  Jia Mohamed 04/10/2021, 8:57 AM

## 2021-04-10 NOTE — Progress Notes (Signed)
Patient has home unit CPAP at beside. Patient stated he is able to place self on when ready.

## 2021-04-13 ENCOUNTER — Encounter: Payer: Self-pay | Admitting: Physical Medicine and Rehabilitation

## 2021-04-14 ENCOUNTER — Other Ambulatory Visit: Payer: Self-pay

## 2021-04-14 ENCOUNTER — Ambulatory Visit (INDEPENDENT_AMBULATORY_CARE_PROVIDER_SITE_OTHER): Payer: Medicare Other | Admitting: Emergency Medicine

## 2021-04-14 DIAGNOSIS — I639 Cerebral infarction, unspecified: Secondary | ICD-10-CM

## 2021-04-14 NOTE — Patient Instructions (Signed)
Device Clinic: [phone] 

## 2021-04-14 NOTE — Progress Notes (Signed)
ILR wound check in clinic. Steri strips removed. Wound well healed. Home monitor transmitting nightly. No episodes. Questions answered.  

## 2021-04-15 LAB — CUP PACEART INCLINIC DEVICE CHECK
Date Time Interrogation Session: 20220614114957
Implantable Pulse Generator Implant Date: 20220606

## 2021-04-16 ENCOUNTER — Telehealth (HOSPITAL_COMMUNITY): Payer: Self-pay | Admitting: Pharmacist

## 2021-04-16 ENCOUNTER — Other Ambulatory Visit (HOSPITAL_COMMUNITY): Payer: Self-pay

## 2021-04-16 NOTE — Telephone Encounter (Signed)
Pharmacy Transitions of Care Follow-up Telephone Call  Date of discharge: 04/10/21 Discharge Diagnosis: CVA  How have you been since you were released from the hospital? Doing fine  Medication changes made at discharge:  - START: Pantoprazole  - STOPPED: Lansoprazole, Spironolactone  - CHANGED: APAP, Alphagan  Medication changes verified by the patient?    Medication Accessibility:  [facility] [place], [place]  Was the patient provided with refills on discharged medications? Y (alphagan and Flomax)  Have all prescriptions been transferred from [facility] to home pharmacy?   Is the patient able to afford medications? N/A Notable copays: N/A Eligible patient assistance: N/A    Medication Review:  CLOPIDOGREL (PLAVIX) Clopidogrel 75mg  once daily.  - Educated patient on expected duration of therapy of 3 weeks of ASA 81mg  with clopidogrel. Advised patient that aspirin will be continued indefinitely.  - Reviewed potential DDIs with patient  - Advised patient of medications to avoid (NSAIDs, ASA)  - Educated that Tylenol (acetaminophen) will be the preferred analgesic to prevent risk of bleeding  - Emphasized importance of monitoring for signs and symptoms of bleeding (abnormal bruising, prolonged bleeding, nose bleeds, bleeding from gums, discolored urine, black tarry stools)  - Advised patient to alert all providers of anticoagulation therapy prior to starting a new medication or having a procedure    Follow-up Appointments:  PCP Hospital f/u appt confirmedScheduled to seeon  @   [facility] f/u appt confirmed? Scheduled to see   If their condition worsens, is the pt aware to call PCP or go to the Emergency Dept.?  Final Patient Assessment:   -Pt is doing well.  -Pt verbalized understanding of  -Pt has post discharge appointment and refill sent to

## 2021-04-21 ENCOUNTER — Other Ambulatory Visit (HOSPITAL_COMMUNITY): Payer: Self-pay

## 2021-04-23 ENCOUNTER — Other Ambulatory Visit (HOSPITAL_COMMUNITY): Payer: Self-pay

## 2021-04-30 ENCOUNTER — Other Ambulatory Visit: Payer: Self-pay

## 2021-04-30 ENCOUNTER — Encounter
Payer: Medicare Other | Attending: Physical Medicine and Rehabilitation | Admitting: Physical Medicine and Rehabilitation

## 2021-04-30 ENCOUNTER — Encounter: Payer: Self-pay | Admitting: Physical Medicine and Rehabilitation

## 2021-04-30 VITALS — BP 143/81 | HR 92 | Temp 98.7°F | Ht 65.0 in | Wt 203.0 lb

## 2021-04-30 DIAGNOSIS — Z8673 Personal history of transient ischemic attack (TIA), and cerebral infarction without residual deficits: Secondary | ICD-10-CM | POA: Diagnosis present

## 2021-04-30 DIAGNOSIS — I10 Essential (primary) hypertension: Secondary | ICD-10-CM | POA: Diagnosis not present

## 2021-04-30 DIAGNOSIS — E785 Hyperlipidemia, unspecified: Secondary | ICD-10-CM

## 2021-04-30 DIAGNOSIS — R000 Tachycardia, unspecified: Secondary | ICD-10-CM

## 2021-04-30 DIAGNOSIS — R471 Dysarthria and anarthria: Secondary | ICD-10-CM

## 2021-04-30 NOTE — Progress Notes (Signed)
Subjective:    Patient ID: Bryan Crane, male    DOB: 1946/05/13, 75 y.o.   MRN: [id number]  HPI Bryan Crane is a 75 year old man who presents for hospital follow-up after CVA.   He has no issues at home.   He asks about return to driving.   He has been going to physical therapy. It has been going very well. One day his HR went up to 115 and it was a stressful day.   Speech much improved  HR 92 in office today. HR has been 60s-70s   BP 143/81  Left hand feels a little tight but that was the way he was before stroke.   He used to walk on the track- as not done this.     Pain Inventory Average Pain 0 Pain Right Now 0 My pain is  n/a  LOCATION OF PAIN  n/a   BOWEL Number of stools per week: 10-14 Oral laxative use No  Type of laxative n/a Enema or suppository use No  History of colostomy No  Incontinent No   BLADDER Normal In and out cath, frequency n/a Able to self cath  n/a Bladder incontinence No  Frequent urination No  Leakage with coughing No  Difficulty starting stream No  Incomplete bladder emptying No    Mobility walk without assistance how many minutes can you walk? 30-60  Function retired  Neuro/Psych numbness  Prior Studies Hospital follow up  Physicians involved in your care Hospital follow up   No family history on file. Social History   Socioeconomic History   Marital status: Married    Spouse name: Not on file   Number of children: Not on file   Years of education: Not on file   Highest education level: Not on file  Occupational History   Not on file  Tobacco Use   Smoking status: Never   Smokeless tobacco: Never  Vaping Use   Vaping Use: Never used  Substance and Sexual Activity   Alcohol use: No   Drug use: No   Sexual activity: Not on file  Other Topics Concern   Not on file  Social History Narrative   Not on file   Social Determinants of Health   Financial Resource Strain: Not on file  Food  Insecurity: Not on file  Transportation Needs: Not on file  Physical Activity: Not on file  Stress: Not on file  Social Connections: Not on file   Past Surgical History:  Procedure Laterality Date   CARDIAC CATHETERIZATION     "years ago" - states it was clear   COLONOSCOPY     KIDNEY STONE SURGERY     LOOP RECORDER INSERTION N/A 04/06/2021   Procedure: LOOP RECORDER INSERTION;  Surgeon: Marinus Maw, MD;  Location: [facility];  Service: Cardiovascular;  Laterality: N/A;   ORIF ELBOW FRACTURE Left 09/30/2018   Procedure: Open reduction internal fixation left elbow fracture with repair reconstruction and  allograft;  Surgeon: Dominica Severin, MD;  Location: [facility] OR;  Service: Orthopedics;  Laterality: Left;  90 mins   TONSILLECTOMY     Past Medical History:  Diagnosis Date   Arthritis    Cardiomyopathy (HCC)    CKD (chronic kidney disease)    Dyspnea    with exertion   Enlarged prostate    GERD (gastroesophageal reflux disease)    History of kidney stones    Hypertension    Sleep apnea    uses  cpap   BP (!) 143/81   Pulse 92   Temp 98.7 F (37.1 C)   Ht 5\' 5"  (1.651 m)   Wt 203 lb (92.1 kg)   SpO2 96%   BMI 33.78 kg/m   Opioid Risk Score:   Fall Risk Score:  `1  Depression screen PHQ 2/9  No flowsheet data found.  Review of Systems  Constitutional: Negative.   HENT: Negative.    Eyes: Negative.   Respiratory: Negative.    Cardiovascular: Negative.   Gastrointestinal: Negative.   Endocrine: Negative.   Musculoskeletal: Negative.   Allergic/Immunologic: Negative.   Neurological:  Positive for numbness.  Hematological: Negative.   Psychiatric/Behavioral:  Positive for suicidal ideas.   All other systems reviewed and are negative.     Objective:   Physical Exam Gen: no distress, normal appearing HEENT: oral mucosa pink and moist, NCAT Cardio: Reg rate Chest: normal effort, normal rate of breathing Abd: soft, non-distended Ext: no edema Psych:  pleasant, normal affect Skin: intact Neuro: Alert and oriented x3.  Musculoskeletal: 5/5 strength throughout.     Assessment & Plan:  Bryan Crane is a 75 year old man who presents for follow-up of CVA.   1) CVA: - doing very well at home - feels frustrated with his eye at times -denies depressed mood -continue outpatient OT and OT -gradually increase walking to achieve 1 hour per day as he did before -restart biking -may discontinue plavix since he has completed his 3 week course -continue aspirin daily -he asks about salt intake: recommended avoiding processed foods and ok to add some salt at home to home cooked meals -can resume sexual activity with wife  RETURN TO DRIVING PLAN:  WITH THE SUPERVISION OF A LICENSED DRIVER, PLEASE DRIVE IN AN EMPTY PARKING LOT FOR AT LEAST 2-3 TRIALS TO TEST REACTION TIME, VISION, USE OF EQUIPMENT IN CAR, ETC.  IF SUCCESSFUL WITH THE PARKING LOT DRIVING, PROCEED TO SUPERVISED DRIVING TRIALS IN YOUR NEIGHBORHOOD STREETS AT LOW TRAFFIC TIMES TO TEST OBSERVATION TO TRAFFIC SIGNALS, REACTION TIME, ETC. PLEASE ATTEMPT AT LEAST 2-3 TRIALS IN YOUR NEIGHBORHOOD.  IF NEIGHBORHOOD DRIVING IS SUCCESSFUL, YOU MAY PROCEED TO DRIVING IN BUSIER AREAS IN YOUR COMMUNITY WITH SUPERVISION OF A LICENSED DRIVER. PLEASE ATTEMPT AT LEAST 4-5 TRIALS.  -all medications reviewed and he does not require refills. We can refill if he needs.   2) tachycardia -HR 92 -has been well controlled at home  3) HTN -BP has been well controlled at home  5) HLD: -discussed that LDL is 114 -recommend salmon once per week, walnuts or some other type of nut daily.   May f/u PRN

## 2021-05-11 ENCOUNTER — Ambulatory Visit (INDEPENDENT_AMBULATORY_CARE_PROVIDER_SITE_OTHER): Payer: Medicare Other

## 2021-05-11 DIAGNOSIS — I639 Cerebral infarction, unspecified: Secondary | ICD-10-CM | POA: Diagnosis not present

## 2021-05-11 LAB — CUP PACEART REMOTE DEVICE CHECK
Date Time Interrogation Session: 20220710183151
Implantable Pulse Generator Implant Date: 20220606

## 2021-06-01 NOTE — Progress Notes (Signed)
Carelink Summary Report / Loop Recorder 

## 2021-06-15 ENCOUNTER — Ambulatory Visit (INDEPENDENT_AMBULATORY_CARE_PROVIDER_SITE_OTHER): Payer: Medicare Other

## 2021-06-15 DIAGNOSIS — I639 Cerebral infarction, unspecified: Secondary | ICD-10-CM | POA: Diagnosis not present

## 2021-06-15 LAB — CUP PACEART REMOTE DEVICE CHECK
Date Time Interrogation Session: 20220812183533
Implantable Pulse Generator Implant Date: 20220606

## 2021-06-15 NOTE — Progress Notes (Signed)
Cardiology Office Note:    Date:  06/16/2021   ID:  Bryan Crane, DOB Jan 28, 1946, MRN [id number]  PCP:  Majel Homer, MD  Cardiologist:  None  Electrophysiologist:  None   Referring MD: Gennie Alma*   Chief Complaint  Patient presents with   New Patient (Initial Visit)   Headache   Shortness of Breath    History of Present Illness:    Bryan Crane is a 75 y.o. male with a hx of hypertrophic cardiomyopathy, CKD stage III, OSA, hypertension, CVA who is referred by Dr. 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  He was admitted 04/03/2021 with acute CVA.  Echocardiogram showed EF 60 to 65%, normal diastolic function, normal RV function, no significant valvular disease.  Loop recorder was placed for work-up of cryptogenic stroke.    Reports he was diagnosed with hypertrophic cardiomyopathy at age 73.  Has followed with Dr.Gooray in [place], [place] since that time.  He now lives in [place].  He denies any chest pain or lower extremity edema.  Does report occasional dyspnea.  States that before his stroke though he was exercising by walking or riding bike for 1 hour daily, denies any exertional symptoms.  Reports intermittent lightheadedness, denies any syncope.  Does report episodes of palpitations where he feels like heart is racing since his stroke.  No smoking history.  He thinks his maternal grandfather had HCM and had PPM placed.  Brother has PPM.    Past Medical History:  Diagnosis Date   Arthritis    Cardiomyopathy (HCC)    CKD (chronic kidney disease)    Dyspnea    with exertion   Enlarged prostate    GERD (gastroesophageal reflux disease)    History of kidney stones    Hypertension    Sleep apnea    uses cpap    Past Surgical History:  Procedure Laterality Date   CARDIAC CATHETERIZATION     "years ago" - states it was clear   COLONOSCOPY     KIDNEY STONE SURGERY     LOOP RECORDER INSERTION N/A 04/06/2021    Procedure: LOOP RECORDER INSERTION;  Surgeon: Marinus Maw, MD;  Location: [facility];  Service: Cardiovascular;  Laterality: N/A;   ORIF ELBOW FRACTURE Left 09/30/2018   Procedure: Open reduction internal fixation left elbow fracture with repair reconstruction and  allograft;  Surgeon: Dominica Severin, MD;  Location: [facility] OR;  Service: Orthopedics;  Laterality: Left;  90 mins   TONSILLECTOMY      Current Medications: Current Meds  Medication Sig   acetaminophen (TYLENOL) 325 MG tablet Take 2 tablets (650 mg total) by mouth every 4 (four) hours as needed for mild pain (or temp > 37.5 C (99.5 F)).   aspirin EC 81 MG EC tablet Take 1 tablet (81 mg total) by mouth every evening. Swallow whole.   atorvastatin (LIPITOR) 40 MG tablet Take 1 tablet (40 mg total) by mouth daily.   brimonidine (ALPHAGAN) 0.2 % ophthalmic solution Place 1 drop into both eyes 3 (three) times daily.   clopidogrel (PLAVIX) 75 MG tablet Take 1 tablet (75 mg total) by mouth daily.   dorzolamide-timolol (COSOPT) 22.3-6.8 MG/ML ophthalmic solution Place 1 drop into both eyes 2 (two) times daily.   latanoprost (XALATAN) 0.005 % ophthalmic solution Place 1 drop into both eyes at bedtime.   Multiple Vitamin (MULTIVITAMIN WITH MINERALS) TABS tablet Take 1 tablet by mouth daily.   pantoprazole (PROTONIX) 40 MG tablet Take 1 tablet (  40 mg total) by mouth daily.   perindopril (ACEON) 8 MG tablet Take 8 mg by mouth daily.   Probiotic Product (PROBIOTIC DAILY PO) Take 1 capsule by mouth daily.   ranolazine (RANEXA) 1000 MG SR tablet Take 1 tablet (1,000 mg total) by mouth at bedtime.   sodium chloride (OCEAN) 0.65 % SOLN nasal spray Place 1 spray into both nostrils as needed for congestion.   tamsulosin (FLOMAX) 0.4 MG CAPS capsule Take 2 capsules (0.8 mg total) by mouth every evening.     Allergies:   Patient has no known allergies.   Social History   Socioeconomic History   Marital status: Married    Spouse name: Not  on file   Number of children: Not on file   Years of education: Not on file   Highest education level: Not on file  Occupational History   Not on file  Tobacco Use   Smoking status: Never   Smokeless tobacco: Never  Vaping Use   Vaping Use: Never used  Substance and Sexual Activity   Alcohol use: No   Drug use: No   Sexual activity: Not on file  Other Topics Concern   Not on file  Social History Narrative   Not on file   Social Determinants of Health   Financial Resource Strain: Not on file  Food Insecurity: Not on file  Transportation Needs: Not on file  Physical Activity: Not on file  Stress: Not on file  Social Connections: Not on file     Family History: He thinks his maternal grandfather had HCM and had PPM placed.  Brother has PPM.  ROS:   Please see the history of present illness.     All other systems reviewed and are negative.  EKGs/Labs/Other Studies Reviewed:    The following studies were reviewed today:   EKG:  EKG is  ordered today.  The ekg ordered today demonstrates sinus rhythm, rate 55, first-degree AV block, right bundle branch block, Q waves in leads I, II, aVL, V5/V6  Recent Labs: 04/07/2021: ALT 16; Hemoglobin 14.5; Platelets 232 04/08/2021: BUN 28; Creatinine, Ser 1.77; Potassium 4.1; Sodium 136  Recent Lipid Panel    Component Value Date/Time   CHOL 187 04/04/2021 0649   TRIG 167 (H) 04/04/2021 0649   HDL 40 (L) 04/04/2021 0649   CHOLHDL 4.7 04/04/2021 0649   VLDL 33 04/04/2021 0649   LDLCALC 114 (H) 04/04/2021 0649    Physical Exam:    VS:  BP 136/62 (BP Location: Left Arm, Patient Position: Sitting, Cuff Size: Large)   Pulse (!) 55   Ht 5\' 5"  (1.651 m)   Wt 205 lb (93 kg)   BMI 34.11 kg/m     Wt Readings from Last 3 Encounters:  06/16/21 205 lb (93 kg)  04/30/21 203 lb (92.1 kg)  04/06/21 197 lb 5 oz (89.5 kg)     GEN:  Well nourished, well developed in no acute distress HEENT: Normal NECK: No JVD; No carotid  bruits LYMPHATICS: No lymphadenopathy CARDIAC: RRR, no murmurs, rubs, gallops RESPIRATORY:  Clear to auscultation without rales, wheezing or rhonchi  ABDOMEN: Soft, non-tender, non-distended MUSCULOSKELETAL:  No edema; No deformity  SKIN: Warm and dry NEUROLOGIC:  Alert and oriented x 3 PSYCHIATRIC:  Normal affect   ASSESSMENT:    1. Hypertrophic cardiomyopathy (HCC)   2. Stage 3b chronic kidney disease (HCC)   3. Hyperlipidemia, unspecified hyperlipidemia type   4. Essential hypertension   5. Cryptogenic stroke (HCC)  PLAN:    Hypertrophic cardiomyopathy: Diagnosed age 82, followed with cardiologist in [place]. -Will obtain records from piror cardiologist -Has loop recorder after recent stroke, can monitor for arrhythmias -Cardiac MRI  Acute CVA: Diagnosed June 2022.  Unclear cause.  Loop recorder placed.  Continue atorvastatin 40 mg.  Completed 3 weeks of DAPT then ASA 81 mg daily  Hypertension: Continue perindopril 8 mg daily.  Appears controlled  Hyperlipidemia: Continue atorvastatin 40 mg daily.  LDL 114 on 04/04/21.  We will recheck lipid panel  CKD stage 3B: Cr 1.77 on 04/08/21.  RTC in 6 months   Medication Adjustments/Labs and Tests Ordered: Current medicines are reviewed at length with the patient today.  Concerns regarding medicines are outlined above.  Orders Placed This Encounter  Procedures   MR CARDIAC MORPHOLOGY W WO CONTRAST   Basic metabolic panel   CBC   Lipid panel   EKG 12-Lead    No orders of the defined types were placed in this encounter.   Patient Instructions  Medication Instructions:  Your physician recommends that you continue on your current medications as directed. Please refer to the Current Medication list given to you today.  *If you need a refill on your cardiac medications before your next appointment, please call your pharmacy*   Lab Work: BMET, CBC, Lipid today   If you have labs (blood work) drawn today and your tests are  completely normal, you will receive your results only by: MyChart Message (if you have MyChart) OR A paper copy in the mail If you have any lab test that is abnormal or we need to change your treatment, we will call you to review the results.   Testing/Procedures: Your physician has requested that you have a cardiac MRI. Cardiac MRI uses a computer to create images of your heart as its beating, producing both still and moving pictures of your heart and major blood vessels. For further information please visit [email or web address]. Please follow the instruction sheet given to you today for more information.  Follow-Up: At [facility], you and your health needs are our priority.  As part of our continuing mission to provide you with exceptional heart care, we have created designated Provider Care Teams.  These Care Teams include your primary Cardiologist (physician) and Advanced Practice Providers (APPs -  Physician Assistants and Nurse Practitioners) who all work together to provide you with the care you need, when you need it.  We recommend signing up for the patient portal called "MyChart".  Sign up information is provided on this After Visit Summary.  MyChart is used to connect with patients for Virtual Visits (Telemedicine).  Patients are able to view lab/test results, encounter notes, upcoming appointments, etc.  Non-urgent messages can be sent to your provider as well.   To learn more about what you can do with MyChart, go to [email or web address].    Your next appointment:   6 month(s)  The format for your next appointment:   In Person  Provider:   Epifanio Lesches, MD     Signed, Little Ishikawa, MD  06/16/2021 1:08 PM    [facility]

## 2021-06-16 ENCOUNTER — Other Ambulatory Visit: Payer: Self-pay

## 2021-06-16 ENCOUNTER — Encounter: Payer: Self-pay | Admitting: Cardiology

## 2021-06-16 ENCOUNTER — Ambulatory Visit (INDEPENDENT_AMBULATORY_CARE_PROVIDER_SITE_OTHER): Payer: Medicare Other | Admitting: Cardiology

## 2021-06-16 VITALS — BP 136/62 | HR 55 | Ht 65.0 in | Wt 205.0 lb

## 2021-06-16 DIAGNOSIS — I10 Essential (primary) hypertension: Secondary | ICD-10-CM

## 2021-06-16 DIAGNOSIS — N1832 Chronic kidney disease, stage 3b: Secondary | ICD-10-CM

## 2021-06-16 DIAGNOSIS — E785 Hyperlipidemia, unspecified: Secondary | ICD-10-CM | POA: Diagnosis not present

## 2021-06-16 DIAGNOSIS — I422 Other hypertrophic cardiomyopathy: Secondary | ICD-10-CM

## 2021-06-16 DIAGNOSIS — I639 Cerebral infarction, unspecified: Secondary | ICD-10-CM

## 2021-06-16 LAB — LIPID PANEL
Chol/HDL Ratio: 2.1 ratio (ref 0.0–5.0)
Cholesterol, Total: 81 mg/dL — ABNORMAL LOW (ref 100–199)
HDL: 38 mg/dL — ABNORMAL LOW (ref >39)
LDL Chol Calc (NIH): 28 mg/dL (ref 0–99)
Triglycerides: 63 mg/dL (ref 0–149)
VLDL Cholesterol Cal: 15 mg/dL (ref 5–40)

## 2021-06-16 LAB — CBC
Hematocrit: 43.8 % (ref 37.5–51.0)
Hemoglobin: 15.3 g/dL (ref 13.0–17.7)
MCH: 31.4 pg (ref 26.6–33.0)
MCHC: 34.9 g/dL (ref 31.5–35.7)
MCV: 90 fL (ref 79–97)
Platelets: 241 10*3/uL (ref 150–450)
RBC: 4.87 x10E6/uL (ref 4.14–5.80)
RDW: 13.1 % (ref 11.6–15.4)
WBC: 8.1 10*3/uL (ref 3.4–10.8)

## 2021-06-16 LAB — BASIC METABOLIC PANEL
BUN/Creatinine Ratio: 14 (ref 10–24)
BUN: 21 mg/dL (ref 8–27)
CO2: 20 mmol/L (ref 20–29)
Calcium: 9.5 mg/dL (ref 8.6–10.2)
Chloride: 103 mmol/L (ref 96–106)
Creatinine, Ser: 1.54 mg/dL — ABNORMAL HIGH (ref 0.76–1.27)
Glucose: 102 mg/dL — ABNORMAL HIGH (ref 65–99)
Potassium: 5 mmol/L (ref 3.5–5.2)
Sodium: 139 mmol/L (ref 134–144)
eGFR: 47 mL/min/{1.73_m2} — ABNORMAL LOW (ref >59)

## 2021-06-16 NOTE — Patient Instructions (Signed)
Medication Instructions:  Your physician recommends that you continue on your current medications as directed. Please refer to the Current Medication list given to you today.  *If you need a refill on your cardiac medications before your next appointment, please call your pharmacy*   Lab Work: BMET, CBC, Lipid today   If you have labs (blood work) drawn today and your tests are completely normal, you will receive your results only by: MyChart Message (if you have MyChart) OR A paper copy in the mail If you have any lab test that is abnormal or we need to change your treatment, we will call you to review the results.   Testing/Procedures: Your physician has requested that you have a cardiac MRI. Cardiac MRI uses a computer to create images of your heart as its beating, producing both still and moving pictures of your heart and major blood vessels. For further information please visit [email or web address]. Please follow the instruction sheet given to you today for more information.  Follow-Up: At [facility] you and your health needs are our priority.  As part of our continuing mission to provide you with exceptional heart care, we have created designated Provider Care Teams.  These Care Teams include your primary Cardiologist (physician) and Advanced Practice Providers (APPs -  Physician Assistants and Nurse Practitioners) who all work together to provide you with the care you need, when you need it.  We recommend signing up for the patient portal called "MyChart".  Sign up information is provided on this After Visit Summary.  MyChart is used to connect with patients for Virtual Visits (Telemedicine).  Patients are able to view lab/test results, encounter notes, upcoming appointments, etc.  Non-urgent messages can be sent to your provider as well.   To learn more about what you can do with MyChart, go to [email or web address].    Your next appointment:   6 month(s)  The format for your  next appointment:   In Person  Provider:   Epifanio Lesches, MD

## 2021-06-19 ENCOUNTER — Other Ambulatory Visit: Payer: Self-pay | Admitting: *Deleted

## 2021-06-19 ENCOUNTER — Encounter: Payer: Self-pay | Admitting: *Deleted

## 2021-06-19 MED ORDER — ATORVASTATIN CALCIUM 20 MG PO TABS: 20.0000 mg | ORAL_TABLET | Freq: Every day | ORAL | 3 refills | Status: DC

## 2021-07-03 NOTE — Progress Notes (Signed)
Carelink Summary Report / Loop Recorder 

## 2021-07-14 ENCOUNTER — Encounter: Payer: Self-pay | Admitting: Diagnostic Neuroimaging

## 2021-07-14 ENCOUNTER — Ambulatory Visit (INDEPENDENT_AMBULATORY_CARE_PROVIDER_SITE_OTHER): Payer: Medicare Other | Admitting: Diagnostic Neuroimaging

## 2021-07-14 ENCOUNTER — Other Ambulatory Visit: Payer: Self-pay

## 2021-07-14 VITALS — BP 132/84 | HR 62 | Ht 65.0 in | Wt 208.1 lb

## 2021-07-14 DIAGNOSIS — Z8673 Personal history of transient ischemic attack (TIA), and cerebral infarction without residual deficits: Secondary | ICD-10-CM | POA: Diagnosis not present

## 2021-07-14 DIAGNOSIS — I639 Cerebral infarction, unspecified: Secondary | ICD-10-CM

## 2021-07-14 NOTE — Progress Notes (Signed)
[facility]  PATIENT: Bryan Crane DOB: 1946/04/05  REFERRING CLINICIAN: Charlton Amor, PA-C HISTORY FROM: patient  REASON FOR VISIT: new consult    HISTORICAL  CHIEF COMPLAINT:  Chief Complaint  Patient presents with   New Patient (Initial Visit)    RM 6 alone here for consult on stroke. Reports stroke took place on 04/03/21- Sts he has been feeling ok since this event. Has been working with therapy 3 days a week and feels like he has done well. Finished Plavix rx.    HISTORY OF PRESENT ILLNESS:   75 year old male here for evaluation of stroke.  History of cardiomyopathy, chronic kidney disease, renal stones, hypertension, sleep apnea.  04/03/21 patient was at dinner when all of a sudden he had balance issues and nausea.  Patient was taken to the hospital via EMS.  Blood pressure was 180/79.  tPA was considered but declined by patient after risk-benefit discussion.  Stroke work-up was completed.  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   REVIEW OF SYSTEMS: Full 14 system review of systems performed and negative with exception of: as per HPI.  ALLERGIES: No Known Allergies  HOME MEDICATIONS: Outpatient Medications Prior to Visit  Medication Sig Dispense Refill   acetaminophen (TYLENOL) 325 MG tablet Take 2 tablets (650 mg total) by mouth every 4 (four) hours as needed for mild pain (or temp > 37.5 C (99.5 F)).     aspirin EC 81 MG EC tablet Take 1 tablet (81 mg total) by mouth every evening. Swallow whole. 30 tablet 11   atorvastatin (LIPITOR) 20 MG tablet Take 1 tablet (20 mg total) by mouth daily. 90 tablet 3   brimonidine (ALPHAGAN) 0.2 % ophthalmic solution Place 1 drop into both eyes 3 (three) times daily. 5 mL 12   dorzolamide-timolol (COSOPT) 22.3-6.8 MG/ML ophthalmic solution Place 1 drop into both eyes 2 (two) times daily.     latanoprost (XALATAN) 0.005 % ophthalmic solution Place 1 drop into both eyes at bedtime.  6    Multiple Vitamin (MULTIVITAMIN WITH MINERALS) TABS tablet Take 1 tablet by mouth daily.     perindopril (ACEON) 8 MG tablet Take 8 mg by mouth daily.     Probiotic Product (PROBIOTIC DAILY PO) Take 1 capsule by mouth daily.     ranolazine (RANEXA) 1000 MG SR tablet Take 1 tablet (1,000 mg total) by mouth at bedtime. 30 tablet 0   sodium chloride (OCEAN) 0.65 % SOLN nasal spray Place 1 spray into both nostrils as needed for congestion.     tamsulosin (FLOMAX) 0.4 MG CAPS capsule Take 2 capsules (0.8 mg total) by mouth every evening. 30 capsule 0   pantoprazole (PROTONIX) 40 MG tablet Take 1 tablet (40 mg total) by mouth daily. (Patient not taking: Reported on 07/14/2021) 30 tablet 0   clopidogrel (PLAVIX) 75 MG tablet Take 1 tablet (75 mg total) by mouth daily. (Patient not taking: Reported on 07/14/2021) 14 tablet 0   No facility-administered medications prior to visit.    PAST MEDICAL HISTORY: Past Medical History:  Diagnosis Date   Arthritis    Cardiomyopathy (HCC)    CKD (chronic kidney disease)    Dyspnea    with exertion   Enlarged prostate    GERD (gastroesophageal reflux disease)    History of kidney stones    Hypertension    Sleep apnea    uses cpap    PAST SURGICAL HISTORY: Past Surgical History:  Procedure Laterality Date  CARDIAC CATHETERIZATION     "years ago" - states it was clear   COLONOSCOPY     KIDNEY STONE SURGERY     LOOP RECORDER INSERTION N/A 04/06/2021   Procedure: LOOP RECORDER INSERTION;  Surgeon: Marinus Maw, MD;  Location: [facility];  Service: Cardiovascular;  Laterality: N/A;   ORIF ELBOW FRACTURE Left 09/30/2018   Procedure: Open reduction internal fixation left elbow fracture with repair reconstruction and  allograft;  Surgeon: Dominica Severin, MD;  Location: [facility] OR;  Service: Orthopedics;  Laterality: Left;  90 mins   TONSILLECTOMY      FAMILY HISTORY: History reviewed. No pertinent family history.  SOCIAL HISTORY: Social History    Socioeconomic History   Marital status: Married    Spouse name: Not on file   Number of children: Not on file   Years of education: Not on file   Highest education level: Not on file  Occupational History   Not on file  Tobacco Use   Smoking status: Never   Smokeless tobacco: Never  Vaping Use   Vaping Use: Never used  Substance and Sexual Activity   Alcohol use: No   Drug use: No   Sexual activity: Not on file  Other Topics Concern   Not on file  Social History Narrative   Left handed    Caffeine- occasional use   Lives at home with his wife    Social Determinants of Health   Financial Resource Strain: Not on file  Food Insecurity: Not on file  Transportation Needs: Not on file  Physical Activity: Not on file  Stress: Not on file  Social Connections: Not on file  Intimate Partner Violence: Not on file     PHYSICAL EXAM  GENERAL EXAM/CONSTITUTIONAL: Vitals:  Vitals:   07/14/21 1445  BP: 132/84  Pulse: 62  SpO2: 98%  Weight: 208 lb 2 oz (94.4 kg)  Height: 5\' 5"  (1.651 m)   Body mass index is 34.63 kg/m. Wt Readings from Last 3 Encounters:  07/14/21 208 lb 2 oz (94.4 kg)  06/16/21 205 lb (93 kg)  04/30/21 203 lb (92.1 kg)   Patient is in no distress; well developed, nourished and groomed; neck is supple  CARDIOVASCULAR: Examination of carotid arteries is normal; no carotid bruits Regular rate and rhythm, no murmurs Examination of peripheral vascular system by observation and palpation is normal  EYES: Ophthalmoscopic exam of optic discs and posterior segments is normal; no papilledema or hemorrhages No results found.  MUSCULOSKELETAL: Gait, strength, tone, movements noted in Neurologic exam below  NEUROLOGIC: MENTAL STATUS:  No flowsheet data found. awake, alert, oriented to person, place and time recent and remote memory intact normal attention and concentration language fluent, comprehension intact, naming intact fund of knowledge  appropriate  CRANIAL NERVE:  2nd - no papilledema on fundoscopic exam 2nd, 3rd, 4th, 6th - pupils equal and reactive to light, visual fields full to confrontation, extraocular muscles intact, no nystagmus 5th - facial sensation symmetric 7th - facial strength symmetric 8th - hearing intact 9th - palate elevates symmetrically, uvula midline 11th - shoulder shrug symmetric 12th - tongue protrusion midline  MOTOR:  normal bulk and tone, full strength in the BUE, BLE  SENSORY:  normal and symmetric to light touch, temperature, vibration  COORDINATION:  finger-nose-finger, fine finger movements normal  REFLEXES:  deep tendon reflexes present and symmetric  GAIT/STATION:  narrow based gait; tandem gait cautious     DIAGNOSTIC DATA (LABS, IMAGING, TESTING) -  I reviewed patient records, labs, notes, testing and imaging myself where available.  Lab Results  Component Value Date   WBC 8.1 06/16/2021   HGB 15.3 06/16/2021   HCT 43.8 06/16/2021   MCV 90 06/16/2021   PLT 241 06/16/2021      Component Value Date/Time   NA 139 06/16/2021 1140   K 5.0 06/16/2021 1140   CL 103 06/16/2021 1140   CO2 20 06/16/2021 1140   GLUCOSE 102 (H) 06/16/2021 1140   GLUCOSE 103 (H) 04/08/2021 0533   BUN 21 06/16/2021 1140   CREATININE 1.54 (H) 06/16/2021 1140   CALCIUM 9.5 06/16/2021 1140   PROT 6.2 (L) 04/07/2021 0509   ALBUMIN 3.1 (L) 04/07/2021 0509   AST 17 04/07/2021 0509   ALT 16 04/07/2021 0509   ALKPHOS 36 (L) 04/07/2021 0509   BILITOT 0.8 04/07/2021 0509   GFRNONAA 40 (L) 04/08/2021 0533   GFRAA 31 (L) 09/30/2018 0704   Lab Results  Component Value Date   CHOL 81 (L) 06/16/2021   HDL 38 (L) 06/16/2021   LDLCALC 28 06/16/2021   TRIG 63 06/16/2021   CHOLHDL 2.1 06/16/2021   Lab Results  Component Value Date   HGBA1C 5.9 (H) 04/06/2021   No results found for: VITAMINB12 No results found for: TSH   04/04/21 MRI brain [I reviewed images myself and agree with  interpretation. -VRP]  - Small acute infarct of the anterior superior right cerebellum. No hemorrhage or mass effect.  04/04/21 CTA head / neck 1. No intracranial hemorrhage.  ASPECTS is 10. 2. No emergent large vessel occlusion or hemodynamically significant stenosis by NASCET criteria. 3. Normal carotid and vertebral arteries.    ASSESSMENT AND PLAN  75 y.o. year old male here with:  Dx:  1. Cerebellar cerebrovascular accident (CVA) without late effect     PLAN:  Right cerebellar stroke (cryptogenic; embolic) - continue aspirin 81mg , atorvastatin, BP control - continue loop recorder monitoring  Return for return to PCP, pending if symptoms worsen or fail to improve.    , MD 07/14/2021, 3:10 PM Certified in Neurology, Neurophysiology and Neuroimaging  [facility] [address], [address] [place], [place] [phone]

## 2021-07-14 NOTE — Patient Instructions (Signed)
  Right cerebellar stroke - continue aspirin 81mg , atorvastatin, BP control

## 2021-07-16 LAB — CUP PACEART REMOTE DEVICE CHECK
Date Time Interrogation Session: 20220914183034
Implantable Pulse Generator Implant Date: 20220606

## 2021-07-20 ENCOUNTER — Ambulatory Visit (INDEPENDENT_AMBULATORY_CARE_PROVIDER_SITE_OTHER): Payer: Medicare Other

## 2021-07-20 DIAGNOSIS — I639 Cerebral infarction, unspecified: Secondary | ICD-10-CM | POA: Diagnosis not present

## 2021-07-24 ENCOUNTER — Telehealth (HOSPITAL_COMMUNITY): Payer: Self-pay | Admitting: *Deleted

## 2021-07-24 NOTE — Telephone Encounter (Signed)
Reaching out to patient to offer assistance regarding upcoming cardiac imaging study; pt verbalizes understanding of appt date/time, parking situation and where to check in, and verified current allergies; name and call back number provided for further questions should they arise  Bryan Nunn RN Navigator Cardiac Imaging [facility] [phone] office [phone] cell  

## 2021-07-27 NOTE — Progress Notes (Signed)
Carelink Summary Report / Loop Recorder 

## 2021-07-28 ENCOUNTER — Ambulatory Visit (HOSPITAL_COMMUNITY)
Admission: RE | Admit: 2021-07-28 | Discharge: 2021-07-28 | Disposition: A | Discharge status: Home / Self Care | Payer: Medicare Other | Source: Ambulatory Visit | Attending: Cardiology | Admitting: Cardiology

## 2021-07-28 DIAGNOSIS — I422 Other hypertrophic cardiomyopathy: Secondary | ICD-10-CM | POA: Diagnosis not present

## 2021-07-28 IMAGING — MR MR CARD MORPHOLOGY WO/W CM
45 of 48 series · 45 of 48 positions shown · IV contrast (Gadavist)
Comparison: none

CLINICAL DATA: HCM evaluation

EXAM:
CARDIAC MRI
TECHNIQUE: The patient was scanned on a 1.5 Tesla Siemens magnet. A dedicated
cardiac coil was used. Functional imaging was done using Fiesta
sequences. [DATE], and 4 chamber views were done to assess for RWMA's.
Modified SANJIBAN rule using a short axis stack was used to
calculate an ejection fraction on a dedicated work station using
Circle software. The patient received 10 cc of Gadavist. After 10
minutes inversion recovery sequences were used to assess for
infiltration and scar tissue.
CONTRAST:  10 cc  of Gadavist

[Series 4: [id number] · axial · 8.0mm · 1.41mm/px · 1 of 16 slices shown]
[im 1/16]
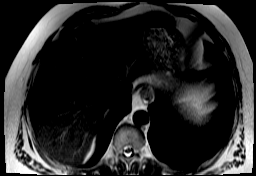

[Series 8: bSSFP · oblique · 8.0mm · 1.61mm/px · 1 of 25 slices shown (1 of 22)]
[im 1/25]
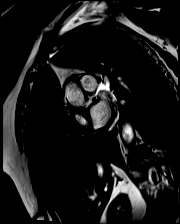

[Series 9: bSSFP · oblique · 8.0mm · 1.61mm/px · 1 of 25 slices shown (2 of 22)]
[im 1/25]
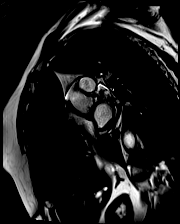

[Series 10: bSSFP · oblique · 8.0mm · 1.61mm/px · 1 of 25 slices shown (3 of 22)]
[im 1/25]
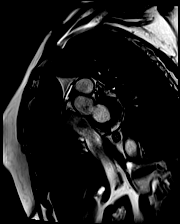

[Series 11: bSSFP · oblique · 8.0mm · 1.61mm/px · 1 of 25 slices shown (4 of 22)]
[im 1/25]
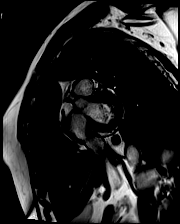

[Series 12: bSSFP · oblique · 8.0mm · 1.61mm/px · 1 of 25 slices shown (5 of 22)]
[im 1/25]
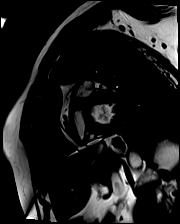

[Series 13: bSSFP · oblique · 8.0mm · 1.61mm/px · 1 of 25 slices shown (6 of 22)]
[im 1/25]
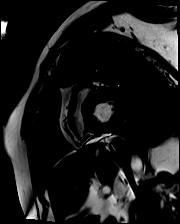

[Series 14: bSSFP · oblique · 8.0mm · 1.61mm/px · 1 of 25 slices shown (7 of 22)]
[im 1/25]
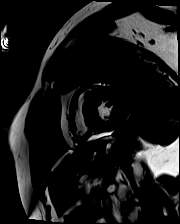

[Series 15: bSSFP · oblique · 8.0mm · 1.61mm/px · 1 of 25 slices shown (8 of 22)]
[im 1/25]
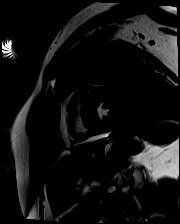

[Series 16: bSSFP · oblique · 8.0mm · 1.61mm/px · 1 of 25 slices shown (9 of 22)]
[im 1/25]
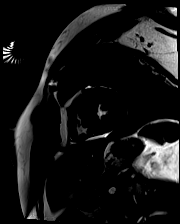

[Series 17: bSSFP · oblique · 8.0mm · 1.61mm/px · 1 of 25 slices shown (10 of 22)]
[im 1/25]
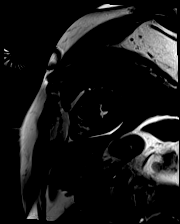

[Series 18: bSSFP · oblique · 8.0mm · 1.61mm/px · 1 of 25 slices shown (11 of 22)]
[im 1/25]
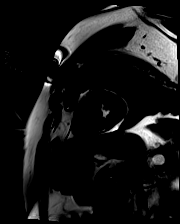

[Series 19: bSSFP · oblique · 8.0mm · 1.61mm/px · 1 of 25 slices shown (12 of 22)]
[im 1/25]
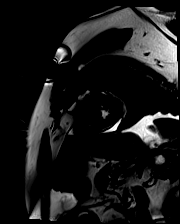

[Series 20: bSSFP · oblique · 8.0mm · 1.61mm/px · 1 of 25 slices shown (13 of 22)]
[im 1/25]
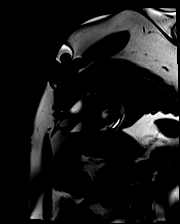

[Series 21: bSSFP · oblique · 8.0mm · 1.61mm/px · 1 of 25 slices shown (14 of 22)]
[im 1/25]
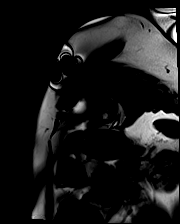

[Series 22: bSSFP · oblique · 8.0mm · 1.61mm/px · 1 of 25 slices shown (15 of 22)]
[im 1/25]
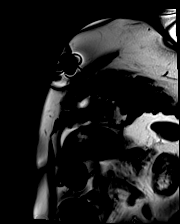

[Series 23: bSSFP · oblique · 8.0mm · 1.61mm/px · 1 of 25 slices shown (16 of 22)]
[im 1/25]
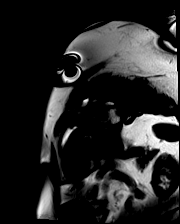

[Series 24: bSSFP · oblique · 8.0mm · 1.61mm/px · 1 of 25 slices shown (17 of 22)]
[im 1/25]
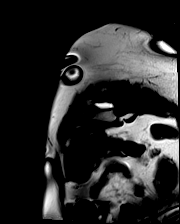

[Series 25: bSSFP · oblique · 8.0mm · 1.61mm/px · 1 of 25 slices shown (18 of 22)]
[im 1/25]
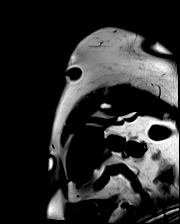

[Series 26: bSSFP · oblique · 6.0mm · 1.41mm/px · 1 of 25 slices shown (19 of 22)]
[im 1/25]
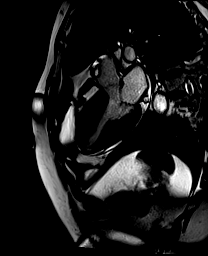

[Series 27: bSSFP · oblique · 6.0mm · 1.41mm/px · 1 of 25 slices shown (20 of 22)]
[im 1/25]
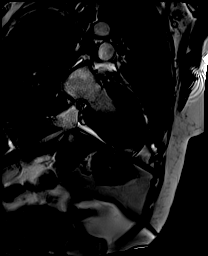

[Series 28: bSSFP · axial · 6.0mm · 1.41mm/px · 1 of 25 slices shown (21 of 22)]
[im 1/25]
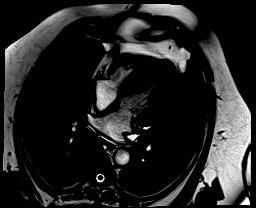

[Series 29: (id)_long_t1 · oblique · 8.0mm · 1.56mm/px · 1 of 24 slices shown]
[im 1/24]
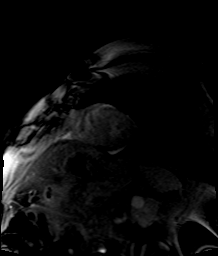

[Series 30: (id)_long_t1_moco · oblique · 8.0mm · 1.56mm/px · 1 of 24 slices shown]
[im 1/24]
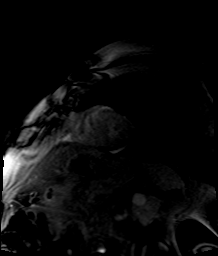

[Series 31: (id)_long_t1_moco_t1 · oblique · 8.0mm · 1.56mm/px · 1 of 6 slices shown]
[im 1/6]
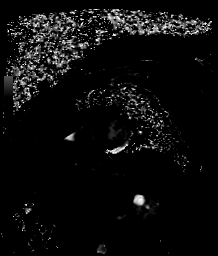

[Series 33: (id)_trufi · oblique · 8.0mm · 2.08mm/px · 1 of 9 slices shown]
[im 1/9]
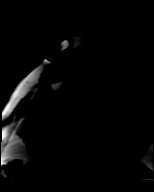

[Series 34: (id)_trufi_moco · oblique · 8.0mm · 2.08mm/px · 1 of 9 slices shown]
[im 1/9]
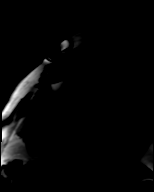

[Series 35: (id)_trufi_moco_t2 · oblique · 8.0mm · 2.08mm/px · 1 of 3 slices shown]
[im 1/3]
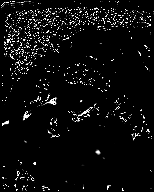

[Series 37: pre short axis · oblique · non-contrast · 8.0mm · 2.25mm/px · 1 of 10 slices shown (1 of 6)]
[im 1/10]
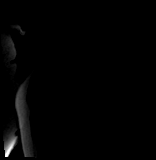

[Series 38: pre short axis · oblique · non-contrast · 8.0mm · 2.25mm/px · 1 of 10 slices shown (2 of 6)]
[im 1/10]
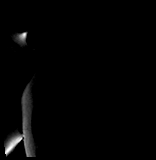

[Series 39: pre short axis · oblique · non-contrast · 8.0mm · 2.25mm/px · 1 of 10 slices shown (3 of 6)]
[im 1/10]
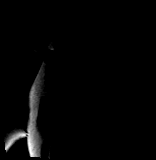

[Series 40: pre short axis · oblique · non-contrast · 8.0mm · 2.25mm/px · 1 of 10 slices shown (4 of 6)]
[im 1/10]
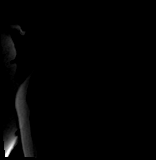

[Series 41: pre short axis · oblique · non-contrast · 8.0mm · 2.25mm/px · 1 of 10 slices shown (5 of 6)]
[im 1/10]
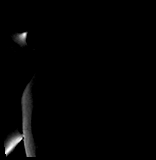

[Series 42: pre short axis · oblique · non-contrast · 8.0mm · 2.25mm/px · 1 of 10 slices shown (6 of 6)]
[im 1/10]
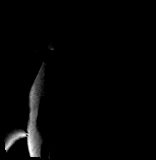

[Series 43: rest short axis · oblique · 8.0mm · 2.25mm/px · 1 of 60 slices shown (1 of 6)]
[im 1/60]
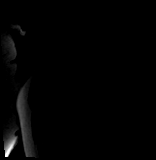

[Series 44: rest short axis · oblique · 8.0mm · 2.25mm/px · 1 of 60 slices shown (2 of 6)]
[im 1/60]
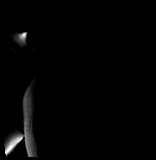

[Series 45: rest short axis · oblique · 8.0mm · 2.25mm/px · 1 of 60 slices shown (3 of 6)]
[im 1/60]
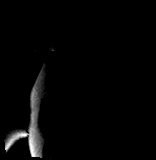

[Series 46: rest short axis · oblique · 8.0mm · 2.25mm/px · 1 of 60 slices shown (4 of 6)]
[im 1/60]
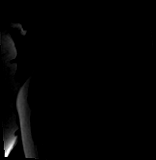

[Series 47: rest short axis · oblique · 8.0mm · 2.25mm/px · 1 of 60 slices shown (5 of 6)]
[im 1/60]
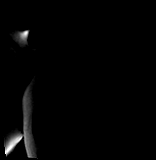

[Series 48: rest short axis · oblique · 8.0mm · 2.25mm/px · 1 of 60 slices shown (6 of 6)]
[im 1/60]
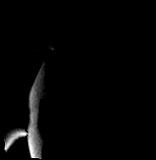

[Series 49: bSSFP · coronal · 6.0mm · 1.41mm/px · 1 of 25 slices shown (22 of 22)]
[im 1/25]
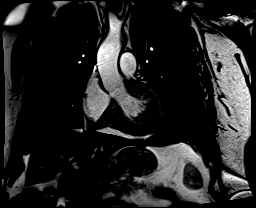

[Series 50: cine rvit · oblique · 6.0mm · 1.41mm/px · 1 of 25 slices shown]
[im 1/25]
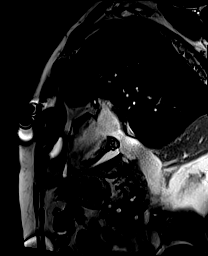

[Series 51: aortic valve cine · oblique · 6.0mm · 1.41mm/px · 1 of 25 slices shown]
[im 1/25]
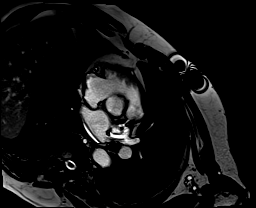

[Series 52: cine rvot · sagittal · 6.0mm · 1.41mm/px · 1 of 25 slices shown (1 of 2)]
[im 1/25]
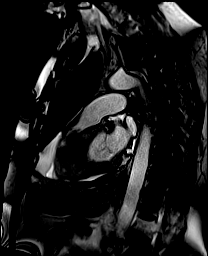

[Series 53: cine rvot · sagittal · 6.0mm · 1.41mm/px · 1 of 25 slices shown (2 of 2)]
[im 1/25]
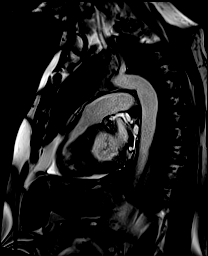

[45 of 48 positions shown; findings below may reference images not displayed]

FINDINGS: Left ventricle:

-Small size

-Low normal systolic function

-Asymmetric hypertrophy measuring up to 24mm in mid septum,
consistent with hypertrophic cardiomyopathy, reverse septal
curvature subtype

-Apical aneurysm

-Normal ECV (27%)

-Patchy LGE at RV insertion sites and dense transmural apical LGE.
LGE accounts for 19% of total myocardial mass

LV EF: 52% (Normal 56-78%)

Absolute volumes:

LV EDV: 77mL (Normal 77-195 mL)

LV ESV: 37mL (Normal 19-72 mL)

LV SV: 40mL (Normal 51-133 mL)

CO: 2.6L/min (Normal 2.8-8.8 L/min)

Indexed volumes:

LV EDV: 37mL/sq-m (Normal 47-92 mL/sq-m)

LV ESV: 18mL/sq-m (Normal 13-30 mL/sq-m)

LV SV: 19mL/sq-m (Normal 32-62 mL/sq-m)

CI: 1.3L/min/sq-m (Normal 1.7-4.2 L/min/sq-m)

Right ventricle: Small size and normal systolic function

RV EF:  56% (Normal 47-74%)

Absolute volumes:

RV EDV: 43mL (Normal 88-227 mL)

RV ESV: 19mL (Normal 23-103 mL)

RV SV: 24mL (Normal 52-138 mL)

CO: 1.6L/min (Normal 2.8-8.8 L/min)

Indexed volumes:

RV EDV: 21mL/sq-m (Normal 55-105 mL/sq-m)

RV ESV: 9mL/sq-m (Normal 15-43 mL/sq-m)

RV SV: 12mL/sq-m (Normal 32-64 mL/sq-m)

CI: 0.8L/min/sq-m (Normal 1.7-4.2 L/min/sq-m)

Left atrium: Mild enlargement

Right atrium: Normal size

Mitral valve: No regurgitation

Aortic valve: No regurgitation

Tricuspid valve: No regurgitation

Pulmonic valve: No regurgitation

Aorta: Normal proximal ascending aorta

Pericardium: Small effusion
IMPRESSION: 1. Asymmetric LV hypertrophy measuring up to 24mm in mid septum,
consistent with hypertrophic cardiomyopathy, reverse septal
curvature subtype

2.  LV apical aneurysm

3. Patchy LGE at RV insertion sites and dense transmural apical LGE,
consistent with HCM with apical aneurysm. LGE accounts for 19% of
total myocardial mass

4.  Small LV size with low normal systolic function (EF 52%)

5.  Small RV size with normal systolic function (EF 56%)

6.  Small pericardial effusion

## 2021-07-28 MED ORDER — GADOBUTROL 1 MMOL/ML IV SOLN
10.0000 mL | Freq: Once | INTRAVENOUS | Status: AC | PRN
  Administered 2021-07-28: 10 mL via INTRAVENOUS

## 2021-08-03 ENCOUNTER — Telehealth: Payer: Self-pay | Admitting: Cardiology

## 2021-08-03 MED ORDER — APIXABAN 5 MG PO TABS: 5.0000 mg | ORAL_TABLET | Freq: Two times a day (BID) | ORAL | 3 refills | Status: AC

## 2021-08-03 NOTE — Telephone Encounter (Signed)
Bryan Ishikawa, MD  08/02/2021 11:00 PM EDT     MRI shows hypertrophic cardiomyopathy with an aneurysm at the apex of the heart and significant amount of scar in the heart.  With apical aneurysms, there is a risk that a blood clot could form within it.  While no clot was seen on this MRI, given his recent stroke of unknown cause, I am concerned this could have been from a blood clot within the aneurysm.  Would recommend starting a blood thinner, would start Eliquis 5 mg twice daily (can stop aspirin).  Aneurysms/scar in heart can also cause abnormal heart rhythms, would recommend evaluating with Zio patch x14 days.  Can we schedule follow-up appointment with him for next week?   Discussed results with patient and wife-aware of results and recommendations.  Rx sent to pharmacy.   Patient has loop recorder in place (MD aware/no zio ordered).   Ok to continue to exercise per MD.    Appt scheduled 10/11 with Dr. Bjorn Pippin to discuss further.    Called [employer] to see if PA needed-no PA needed but may be in donut hole (~$500).  Called patient and wife back-provided website and phone # to call to get 30 day trial card activated (patient lives in [place], 1 hour away).    They will call to get this activated and we will proceed with patient assistance at [facility] next week.     Advised to call back with any issues or additional questions.

## 2021-08-03 NOTE — Telephone Encounter (Signed)
Patient is returning call to discuss MRI results. 

## 2021-08-04 NOTE — Telephone Encounter (Signed)
Returned the call to the patient. He stated that it was too complicated to navigate the assistance online. He will be coming into [facility] tomorrow and would like to pick up some samples of Eliquis, a discount card and paperwork.

## 2021-08-04 NOTE — Telephone Encounter (Signed)
Ok for sample eliquis 5mg  bid please provide pt assistant form  Prescription sample request for Eliquis received.   Indication: cva, apical aneurysms Last office visit: 06/16/2021, 06/18/2021 Scr: 1.54, 06/16/2021 Age: 75 yo  Weight: 94.4 kg

## 2021-08-04 NOTE — Telephone Encounter (Deleted)
Prescription refill request for Eliquis received.  Indication: cva, apical aneurysms Last office visit: 06/16/2021, Bryan Crane Scr: 1.54, 06/16/2021 Age: 75 yo  Weight: 94.4 kg

## 2021-08-04 NOTE — Telephone Encounter (Signed)
Spoke with pt, aware there are no samples available at this time. Savings card placed at the front desk for patient pick up.

## 2021-08-04 NOTE — Telephone Encounter (Signed)
Patient was calling back with questions/concerns

## 2021-08-06 ENCOUNTER — Telehealth: Payer: Self-pay | Admitting: Cardiology

## 2021-08-06 NOTE — Telephone Encounter (Signed)
Patient calling the office for samples of medication: ° ° °1.  What medication and dosage are you requesting samples for? ° °apixaban (ELIQUIS) 5 MG TABS tablet ° °2.  Are you currently out of this medication?  yes ° ° °

## 2021-08-06 NOTE — Telephone Encounter (Signed)
Pt qualifies for eliquis 5mg  bid samples. Please provide the pt assistance form below: [email or web address]

## 2021-08-11 ENCOUNTER — Ambulatory Visit (INDEPENDENT_AMBULATORY_CARE_PROVIDER_SITE_OTHER): Payer: Medicare Other | Admitting: Cardiology

## 2021-08-11 ENCOUNTER — Encounter: Payer: Self-pay | Admitting: Cardiology

## 2021-08-11 ENCOUNTER — Other Ambulatory Visit: Payer: Self-pay

## 2021-08-11 VITALS — BP 152/77 | HR 57 | Ht 67.0 in | Wt 212.0 lb

## 2021-08-11 DIAGNOSIS — I10 Essential (primary) hypertension: Secondary | ICD-10-CM | POA: Diagnosis not present

## 2021-08-11 DIAGNOSIS — I639 Cerebral infarction, unspecified: Secondary | ICD-10-CM

## 2021-08-11 DIAGNOSIS — I422 Other hypertrophic cardiomyopathy: Secondary | ICD-10-CM

## 2021-08-11 DIAGNOSIS — N1832 Chronic kidney disease, stage 3b: Secondary | ICD-10-CM

## 2021-08-11 MED ORDER — PERINDOPRIL ERBUMINE 8 MG PO TABS: 16.0000 mg | ORAL_TABLET | Freq: Every day | ORAL | 3 refills | Status: AC

## 2021-08-11 NOTE — Progress Notes (Addendum)
Cardiology Office Note:    Date:  08/11/2021   ID:  Bryan Crane, DOB [birth year], MRN [id number]  PCP:  Majel Homer, MD  Cardiologist:  None  Electrophysiologist:  None   Referring MD: Majel Homer, MD   Chief Complaint  Patient presents with   Cardiomyopathy     History of Present Illness:    Bryan Crane is a 75 y.o. male with a hx of hypertrophic cardiomyopathy, CKD stage III, OSA, hypertension, CVA who presents for follow-up.  He was referred by Dr. Janeece Fitting for evaluation of hypertrophic cardiomyopathy, he was admitted 04/03/2021 with acute CVA.  Echocardiogram showed EF 60 to 65%, normal diastolic function, normal RV function, no significant valvular disease.  Loop recorder was placed for work-up of cryptogenic stroke.    Reports he was diagnosed with hypertrophic cardiomyopathy at age 35.  Has followed with Dr.Gooray in [place], [place] since that time.  He now lives in [place].  He denies any chest pain or lower extremity edema.  Does report occasional dyspnea.  States that before his stroke though he was exercising by walking or riding bike for 1 hour daily, denies any exertional symptoms.  Reports intermittent lightheadedness, denies any syncope.  Does report episodes of palpitations where he feels like heart is racing since his stroke.  No smoking history.  He thinks his maternal grandfather had HCM and had PPM placed.  Brother has PPM.  Cardiac MRI on 07/28/2021 showed asymmetric LV hypertrophy measuring up to 24 mm in mid septum, consistent with hypertrophic cardiomyopathy reverse septal curvature subtype.  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  He was started on Eliquis given LV apical aneurysm and recent cryptogenic stroke.  Since last clinic visit, he reports that he has been doing well.  Denies any chest pain or dyspnea.   Reports occasional lightheadedness particularly when bending over but denies any syncope.  Walks or riding bike for 30 minutes for exercise.  Denies any exertional symptoms.  He has started Eliquis, denies any issues.  BP Readings from Last 3 Encounters:  08/11/21 (!) 152/77  07/14/21 132/84  06/16/21 136/62     Past Medical History:  Diagnosis Date   Arthritis    Cardiomyopathy (HCC)    CKD (chronic kidney disease)    Dyspnea    with exertion   Enlarged prostate    GERD (gastroesophageal reflux disease)    History of kidney stones    Hypertension    Sleep apnea    uses cpap    Past Surgical History:  Procedure Laterality Date   CARDIAC CATHETERIZATION     "years ago" - states it was clear   COLONOSCOPY     KIDNEY STONE SURGERY     LOOP RECORDER INSERTION N/A 04/06/2021   Procedure: LOOP RECORDER INSERTION;  Surgeon: Marinus Maw, MD;  Location: [facility];  Service: Cardiovascular;  Laterality: N/A;   ORIF ELBOW FRACTURE Left 09/30/2018   Procedure: Open reduction internal fixation left elbow fracture with repair reconstruction and  allograft;  Surgeon: Dominica Severin, MD;  Location: [facility];  Service: Orthopedics;  Laterality: Left;  90 mins   TONSILLECTOMY      Current Medications: Current Meds  Medication Sig   acetaminophen (TYLENOL) 325 MG tablet Take 2 tablets (650 mg total) by mouth every 4 (four) hours as needed for mild pain (or  temp > 37.5 C (99.5 F)).   apixaban (ELIQUIS) 5 MG TABS tablet Take 1 tablet (5 mg total) by mouth 2 (two) times daily.   atorvastatin (LIPITOR) 20 MG tablet Take 1 tablet (20 mg total) by mouth daily.   brimonidine (ALPHAGAN) 0.2 % ophthalmic solution Place 1 drop into both eyes 3 (three) times daily.   dorzolamide-timolol (COSOPT) 22.3-6.8 MG/ML ophthalmic solution Place 1 drop into both eyes 2 (two) times daily.   latanoprost (XALATAN) 0.005 % ophthalmic solution Place 1 drop into both eyes at bedtime.   Multiple Vitamin  (MULTIVITAMIN WITH MINERALS) TABS tablet Take 1 tablet by mouth daily.   pantoprazole (PROTONIX) 40 MG tablet Take 1 tablet (40 mg total) by mouth daily.   Probiotic Product (PROBIOTIC DAILY PO) Take 1 capsule by mouth daily.   ranolazine (RANEXA) 1000 MG SR tablet Take 1 tablet (1,000 mg total) by mouth at bedtime.   sodium chloride (OCEAN) 0.65 % SOLN nasal spray Place 1 spray into both nostrils as needed for congestion.   tamsulosin (FLOMAX) 0.4 MG CAPS capsule Take 2 capsules (0.8 mg total) by mouth every evening.   [DISCONTINUED] perindopril (ACEON) 8 MG tablet Take 8 mg by mouth daily.   [DISCONTINUED] spironolactone (ALDACTONE) 25 MG tablet TAKE 1 TABLET BY MOUTH EVERY MONDAY, WEDNESDAY, AND FRIDAY BY MOUTH     Allergies:   Patient has no known allergies.   Social History   Socioeconomic History   Marital status: Married    Spouse name: Not on file   Number of children: Not on file   Years of education: Not on file   Highest education level: Not on file  Occupational History   Not on file  Tobacco Use   Smoking status: Never   Smokeless tobacco: Never  Vaping Use   Vaping Use: Never used  Substance and Sexual Activity   Alcohol use: No   Drug use: No   Sexual activity: Not on file  Other Topics Concern   Not on file  Social History Narrative   Left handed    Caffeine- occasional use   Lives at home with his wife    Social Determinants of Health   Financial Resource Strain: Not on file  Food Insecurity: Not on file  Transportation Needs: Not on file  Physical Activity: Not on file  Stress: Not on file  Social Connections: Not on file     Family History: He thinks his maternal grandfather had HCM and had PPM placed.  Brother has PPM.  ROS:   Please see the history of present illness.     All other systems reviewed and are negative.  EKGs/Labs/Other Studies Reviewed:    The following studies were reviewed today:   EKG:  EKG is  ordered today.  The ekg  ordered today demonstrates sinus rhythm, rate 57, first-degree AV block, right bundle branch block, Q waves in aVL, I, V5/6  Recent Labs: 04/07/2021: ALT 16 06/16/2021: BUN 21; Creatinine, Ser 1.54; Hemoglobin 15.3; Platelets 241; Potassium 5.0; Sodium 139  Recent Lipid Panel    Component Value Date/Time   CHOL 81 (L) 06/16/2021 1140   TRIG 63 06/16/2021 1140   HDL 38 (L) 06/16/2021 1140   CHOLHDL 2.1 06/16/2021 1140   CHOLHDL 4.7 04/04/2021 0649   VLDL 33 04/04/2021 0649   LDLCALC 28 06/16/2021 1140    Physical Exam:    VS:  BP (!) 152/77 (BP Location: Left Arm, Patient Position: Sitting, Cuff Size: Large)  Pulse (!) 57   Ht 5\' 7"  (1.702 m)   Wt 212 lb (96.2 kg)   SpO2 100%   BMI 33.20 kg/m     Wt Readings from Last 3 Encounters:  08/11/21 212 lb (96.2 kg)  07/14/21 208 lb 2 oz (94.4 kg)  06/16/21 205 lb (93 kg)     GEN:  Well nourished, well developed in no acute distress HEENT: Normal NECK: No JVD; No carotid bruits LYMPHATICS: No lymphadenopathy CARDIAC: RRR, no murmurs, rubs, gallops RESPIRATORY:  Clear to auscultation without rales, wheezing or rhonchi  ABDOMEN: Soft, non-tender, non-distended MUSCULOSKELETAL:  No edema; No deformity  SKIN: Warm and dry NEUROLOGIC:  Alert and oriented x 3 PSYCHIATRIC:  Normal affect   ASSESSMENT:    1. Hypertrophic cardiomyopathy (HCC)   2. Essential hypertension   3. Stage 3b chronic kidney disease (HCC)   4. Cryptogenic stroke [facility])     PLAN:    Hypertrophic cardiomyopathy: Diagnosed age 56, followed with cardiologist in [place].  Cardiac MRI on 07/28/2021 showed asymmetric LV hypertrophy measuring up to 24 mm in mid septum, consistent with hypertrophic cardiomyopathy reverse septal curvature subtype.  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   -While no LV thrombus seen on MRI, he is at high risk for thrombus  given LV apical aneurysm.  Given recent cryptogenic stroke, concerning that the apical aneurysm is the source of his stroke and would recommend starting Eliquis 5 mg twice daily -He does meet indications for ICD placement, given LV apical aneurysm and significant LGE burden on recent MRI (19% total myocardial mass).  However he is 75 year old and has had no h/o ventricular arrhythmias, and does have a loop that was placed after his stroke in June 2022 and no ventricular arrhythmias seen to date.  Would recommend referring to  EP to consider ICD vs continuing to monitor with his loop recorder.  Acute CVA: Diagnosed June 2022.  Loop recorder placed.  Given LV apical aneurysm, concern for LV thrombus as source.  Started Eliquis as above.  Continue atorvastatin 40 mg.    Hypertension: Currently on perindopril 8 mg daily and spironolactone 25 mg MWF.  Would favor avoiding diuretics given his HCM as above.  Low resting heart rate, no room to add beta-blocker or calcium channel blocker.  Would increase perindopril to 16 mg daily and discontinue spironolactone.  Will check BMP  Hyperlipidemia: Continue atorvastatin 40 mg daily.  LDL 28 on 06/16/2021  CKD stage 3B: Cr 1.54 on 06/16/2021.  We will check BMP  RTC in 3 months   Medication Adjustments/Labs and Tests Ordered: Current medicines are reviewed at length with the patient today.  Concerns regarding medicines are outlined above.  Orders Placed This Encounter  Procedures   Basic metabolic panel   Ambulatory referral to Cardiac Electrophysiology   EKG 12-Lead     Meds ordered this encounter  Medications   perindopril (ACEON) 8 MG tablet    Sig: Take 2 tablets (16 mg total) by mouth daily.    Dispense:  180 tablet    Refill:  3    Dose increase     Patient Instructions  Medication Instructions:  STOP spironolactone  INCREASE perindopril to 16 mg (2 tablets) daily  Please check your blood pressure at home twice daily, write it down.   Call the office or send message via Mychart  with the readings in 1-2 weeks for Dr. Bjorn Pippin to review.   *If you need a refill on your cardiac medications before your next appointment, please call your pharmacy*   Lab Work: BMET today  If you have labs (blood work) drawn today and your tests are completely normal, you will receive your results only by: MyChart Message (if you have MyChart) OR A paper copy in the mail If you have any lab test that is abnormal or we need to change your treatment, we will call you to review the results.  Follow-Up: At [facility], you and your health needs are our priority.  As part of our continuing mission to provide you with exceptional heart care, we have created designated Provider Care Teams.  These Care Teams include your primary Cardiologist (physician) and Advanced Practice Providers (APPs -  Physician Assistants and Nurse Practitioners) who all work together to provide you with the care you need, when you need it.  We recommend signing up for the patient portal called "MyChart".  Sign up information is provided on this After Visit Summary.  MyChart is used to connect with patients for Virtual Visits (Telemedicine).  Patients are able to view lab/test results, encounter notes, upcoming appointments, etc.  Non-urgent messages can be sent to your provider as well.   To learn more about what you can do with MyChart, go to [email or web address].    Your next appointment:   3 month(s)  The format for your next appointment:   In Person  Provider:   Epifanio Lesches, MD   Other Instructions You have been referred to: Electrophysiology    Signed, Little Ishikawa, MD  08/11/2021 6:14 PM    [facility]

## 2021-08-11 NOTE — Patient Instructions (Addendum)
Medication Instructions:  STOP spironolactone  INCREASE perindopril to 16 mg (2 tablets) daily  Please check your blood pressure at home twice daily, write it down.  Call the office or send message via Mychart with the readings in 1-2 weeks for Dr. Bjorn Pippin to review.   *If you need a refill on your cardiac medications before your next appointment, please call your pharmacy*   Lab Work: BMET today  If you have labs (blood work) drawn today and your tests are completely normal, you will receive your results only by: MyChart Message (if you have MyChart) OR A paper copy in the mail If you have any lab test that is abnormal or we need to change your treatment, we will call you to review the results.  Follow-Up: At [facility], you and your health needs are our priority.  As part of our continuing mission to provide you with exceptional heart care, we have created designated Provider Care Teams.  These Care Teams include your primary Cardiologist (physician) and Advanced Practice Providers (APPs -  Physician Assistants and Nurse Practitioners) who all work together to provide you with the care you need, when you need it.  We recommend signing up for the patient portal called "MyChart".  Sign up information is provided on this After Visit Summary.  MyChart is used to connect with patients for Virtual Visits (Telemedicine).  Patients are able to view lab/test results, encounter notes, upcoming appointments, etc.  Non-urgent messages can be sent to your provider as well.   To learn more about what you can do with MyChart, go to [email or web address].    Your next appointment:   3 month(s)  The format for your next appointment:   In Person  Provider:   Epifanio Lesches, MD   Other Instructions You have been referred to: Electrophysiology

## 2021-08-12 LAB — BASIC METABOLIC PANEL
BUN/Creatinine Ratio: 11 (ref 10–24)
BUN: 16 mg/dL (ref 8–27)
CO2: 21 mmol/L (ref 20–29)
Calcium: 9.4 mg/dL (ref 8.6–10.2)
Chloride: 106 mmol/L (ref 96–106)
Creatinine, Ser: 1.52 mg/dL — ABNORMAL HIGH (ref 0.76–1.27)
Glucose: 83 mg/dL (ref 70–99)
Potassium: 4.2 mmol/L (ref 3.5–5.2)
Sodium: 142 mmol/L (ref 134–144)
eGFR: 47 mL/min/{1.73_m2} — ABNORMAL LOW (ref >59)

## 2021-08-13 ENCOUNTER — Other Ambulatory Visit: Payer: Self-pay | Admitting: *Deleted

## 2021-08-13 DIAGNOSIS — N1832 Chronic kidney disease, stage 3b: Secondary | ICD-10-CM

## 2021-08-13 DIAGNOSIS — I10 Essential (primary) hypertension: Secondary | ICD-10-CM

## 2021-08-18 ENCOUNTER — Encounter: Payer: Self-pay | Admitting: Internal Medicine

## 2021-08-18 ENCOUNTER — Ambulatory Visit (INDEPENDENT_AMBULATORY_CARE_PROVIDER_SITE_OTHER): Payer: Medicare Other | Admitting: Internal Medicine

## 2021-08-18 ENCOUNTER — Other Ambulatory Visit: Payer: Self-pay

## 2021-08-18 DIAGNOSIS — I639 Cerebral infarction, unspecified: Secondary | ICD-10-CM

## 2021-08-18 DIAGNOSIS — I422 Other hypertrophic cardiomyopathy: Secondary | ICD-10-CM | POA: Diagnosis not present

## 2021-08-18 NOTE — Progress Notes (Signed)
HPI Bryan Crane is referred by Dr. Bjorn Pippin for evaluation of HCM and to consider ICD insertion. He is a pleasant 75 yo man with HCM diagnosed almost 30 years ago. He has a h/o stroke and was placed with a ILR. 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 He has not had syncope. His RV and LV size and function are low normal. He was placed on eliquis as it was thought that the apical aneurysm was the likely cause of the clot leading to a stroke. He has not yet had any atrial fib. He has longevity in his family. The oldest was a GM who lived into her 57's. There is no family h/o sudden death at a young age. No Known Allergies   Current Outpatient Medications  Medication Sig Dispense Refill   acetaminophen (TYLENOL) 325 MG tablet Take 2 tablets (650 mg total) by mouth every 4 (four) hours as needed for mild pain (or temp > 37.5 C (99.5 F)).     apixaban (ELIQUIS) 5 MG TABS tablet Take 1 tablet (5 mg total) by mouth 2 (two) times daily. 180 tablet 3   atorvastatin (LIPITOR) 20 MG tablet Take 1 tablet (20 mg total) by mouth daily. 90 tablet 3   brimonidine (ALPHAGAN) 0.2 % ophthalmic solution Place 1 drop into both eyes 3 (three) times daily. 5 mL 12   dorzolamide-timolol (COSOPT) 22.3-6.8 MG/ML ophthalmic solution Place 1 drop into both eyes 2 (two) times daily.     latanoprost (XALATAN) 0.005 % ophthalmic solution Place 1 drop into both eyes at bedtime.  6   Multiple Vitamin (MULTIVITAMIN WITH MINERALS) TABS tablet Take 1 tablet by mouth daily.     pantoprazole (PROTONIX) 40 MG tablet Take 1 tablet (40 mg total) by mouth daily. 30 tablet 0   perindopril (ACEON) 8 MG tablet Take 2 tablets (16 mg total) by mouth daily. 180 tablet 3   Probiotic Product (PROBIOTIC DAILY PO) Take 1 capsule by mouth daily.     ranolazine (RANEXA) 1000 MG SR tablet Take 1 tablet (1,000 mg total) by mouth at bedtime. 30 tablet 0   sodium chloride (OCEAN) 0.65  % SOLN nasal spray Place 1 spray into both nostrils as needed for congestion.     tamsulosin (FLOMAX) 0.4 MG CAPS capsule Take 2 capsules (0.8 mg total) by mouth every evening. 30 capsule 0   No current facility-administered medications for this visit.     Past Medical History:  Diagnosis Date   Arthritis    Cardiomyopathy (HCC)    CKD (chronic kidney disease)    Dyspnea    with exertion   Enlarged prostate    GERD (gastroesophageal reflux disease)    History of kidney stones    Hypertension    Sleep apnea    uses cpap    ROS:   All systems reviewed and negative except as noted in the HPI.   Past Surgical History:  Procedure Laterality Date   CARDIAC CATHETERIZATION     "years ago" - states it was clear   COLONOSCOPY     KIDNEY STONE SURGERY     LOOP RECORDER INSERTION N/A 04/06/2021   Procedure: LOOP RECORDER INSERTION;  Surgeon: Marinus Maw, MD;  Location: [facility];  Service: Cardiovascular;  Laterality: N/A;   ORIF ELBOW FRACTURE Left 09/30/2018   Procedure: Open reduction internal fixation left elbow fracture with repair reconstruction and  allograft;  Surgeon: Dominica Severin, MD;  Location: [facility] OR;  Service: Orthopedics;  Laterality: Left;  90 mins   TONSILLECTOMY       No family history on file.   Social History   Socioeconomic History   Marital status: Married    Spouse name: Not on file   Number of children: Not on file   Years of education: Not on file   Highest education level: Not on file  Occupational History   Not on file  Tobacco Use   Smoking status: Never   Smokeless tobacco: Never  Vaping Use   Vaping Use: Never used  Substance and Sexual Activity   Alcohol use: No   Drug use: No   Sexual activity: Not on file  Other Topics Concern   Not on file  Social History Narrative   Left handed    Caffeine- occasional use   Lives at home with his wife    Social Determinants of Health   Financial Resource Strain: Not on file   Food Insecurity: Not on file  Transportation Needs: Not on file  Physical Activity: Not on file  Stress: Not on file  Social Connections: Not on file  Intimate Partner Violence: Not on file     BP (!) 144/90   Pulse 72   Ht 5\' 6"  (1.676 m)   Wt 212 lb (96.2 kg)   SpO2 96%   BMI 34.22 kg/m   Physical Exam:  Well appearing NAD HEENT: Unremarkable Neck:  No JVD, no thyromegally Lymphatics:  No adenopathy Back:  No CVA tenderness Lungs:  Clear with no wheezes HEART:  Regular rate rhythm, no murmurs, no rubs, no clicks Abd:  soft, positive bowel sounds, no organomegally, no rebound, no guarding Ext:  2 plus pulses, no edema, no cyanosis, no clubbing Skin:  No rashes no nodules Neuro:  CN II through XII intact, motor grossly intact  EKG - reviewed. NSR with marked first degree AV block and RBBB   Assess/Plan:  HCM - the patient has an apical aneurysm and a very thick septum and is at risk for sudden death. He has been well. He does not have much in the way of other symptoms. I have outlined the treatment options in detail. ICD insertion is an option. He will call if he would like to proceed.  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 I might consider a DDD ICD. HTN - his bp is up a little. We will follow for now. We will encouraged a low sodium diet. 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 He will continue his current meds.  Bryan Arla Boutwell,MD

## 2021-08-18 NOTE — Patient Instructions (Addendum)
Medication Instructions:  Your physician recommends that you continue on your current medications as directed. Please refer to the Current Medication list given to you today.  Labwork: None ordered.  Testing/Procedures: Your physician has recommended that you have a defibrillator inserted. An implantable cardioverter defibrillator (ICD) is a small device that is placed in your chest or, in rare cases, your abdomen. This device uses electrical pulses or shocks to help control life-threatening, irregular heartbeats that could lead the heart to suddenly stop beating (sudden cardiac arrest). Leads are attached to the ICD that goes into your heart. This is done in the hospital and usually requires an overnight stay. Please see the instruction sheet given to you today for more information.  Follow-Up:  The following dates are available for ICD implant:  October 27, 31 November 7, 11, 14, 23, 25 and 28  Any Other Special Instructions Will Be Listed Below (If Applicable).  If you need a refill on your cardiac medications before your next appointment, please call your pharmacy.

## 2021-08-24 ENCOUNTER — Ambulatory Visit (INDEPENDENT_AMBULATORY_CARE_PROVIDER_SITE_OTHER): Payer: Medicare Other

## 2021-08-24 DIAGNOSIS — I639 Cerebral infarction, unspecified: Secondary | ICD-10-CM | POA: Diagnosis not present

## 2021-08-25 ENCOUNTER — Other Ambulatory Visit: Payer: Self-pay | Admitting: *Deleted

## 2021-08-25 DIAGNOSIS — N1832 Chronic kidney disease, stage 3b: Secondary | ICD-10-CM

## 2021-08-25 DIAGNOSIS — I422 Other hypertrophic cardiomyopathy: Secondary | ICD-10-CM

## 2021-08-25 DIAGNOSIS — I10 Essential (primary) hypertension: Secondary | ICD-10-CM

## 2021-08-25 LAB — CUP PACEART REMOTE DEVICE CHECK
Date Time Interrogation Session: 20221017183503
Implantable Pulse Generator Implant Date: 20220606

## 2021-08-29 LAB — BASIC METABOLIC PANEL
BUN/Creatinine Ratio: 10 (ref 10–24)
BUN: 17 mg/dL (ref 8–27)
CO2: 23 mmol/L (ref 20–29)
Calcium: 9.4 mg/dL (ref 8.6–10.2)
Chloride: 106 mmol/L (ref 96–106)
Creatinine, Ser: 1.67 mg/dL — ABNORMAL HIGH (ref 0.76–1.27)
Glucose: 99 mg/dL (ref 70–99)
Potassium: 4.3 mmol/L (ref 3.5–5.2)
Sodium: 141 mmol/L (ref 134–144)
eGFR: 42 mL/min/{1.73_m2} — ABNORMAL LOW (ref >59)

## 2021-09-01 NOTE — Progress Notes (Signed)
Carelink Summary Report / Loop Recorder 

## 2021-09-22 ENCOUNTER — Other Ambulatory Visit: Payer: Self-pay

## 2021-09-22 ENCOUNTER — Encounter: Payer: Self-pay | Admitting: Cardiology

## 2021-09-28 ENCOUNTER — Ambulatory Visit (INDEPENDENT_AMBULATORY_CARE_PROVIDER_SITE_OTHER): Payer: Medicare Other

## 2021-09-28 DIAGNOSIS — I422 Other hypertrophic cardiomyopathy: Secondary | ICD-10-CM | POA: Diagnosis not present

## 2021-09-28 LAB — CUP PACEART REMOTE DEVICE CHECK
Date Time Interrogation Session: 20221119183545
Implantable Pulse Generator Implant Date: 20220606

## 2021-10-06 NOTE — Progress Notes (Signed)
Carelink Summary Report / Loop Recorder 

## 2021-10-23 ENCOUNTER — Ambulatory Visit (INDEPENDENT_AMBULATORY_CARE_PROVIDER_SITE_OTHER): Payer: Medicare Other

## 2021-10-23 DIAGNOSIS — I422 Other hypertrophic cardiomyopathy: Secondary | ICD-10-CM

## 2021-10-23 LAB — CUP PACEART REMOTE DEVICE CHECK
Date Time Interrogation Session: 20221222183602
Implantable Pulse Generator Implant Date: 20220606

## 2021-11-03 NOTE — Progress Notes (Signed)
Carelink Summary Report / Loop Recorder 

## 2021-11-08 NOTE — Progress Notes (Signed)
Cardiology Office Note:    Date:  11/12/2021   ID:  Bryan Crane, DOB [birth year], MRN [id number]  PCP:  Allie Dimmer, MD  Cardiologist:  Donato Heinz, MD  Electrophysiologist:  None   Referring MD: Allie Dimmer, MD   Chief Complaint  Patient presents with   Follow-up    3 months.   Cardiomyopathy     History of Present Illness:    Bryan Crane is a 76 y.o. male with a hx of hypertrophic cardiomyopathy, CKD stage III, OSA, hypertension, CVA who presents for follow-up.  He was referred by Dr. Lorne Skeens for evaluation of hypertrophic cardiomyopathy, he was admitted 04/03/2021 with acute CVA.  Echocardiogram showed EF 60 to 123456, normal diastolic function, normal RV function, no significant valvular disease.  Loop recorder was placed for work-up of cryptogenic stroke.    Reports he was diagnosed with hypertrophic cardiomyopathy at age 42.  Has followed with Dr.Gooray in [place], [place] since that time.  He now lives in [place].  He denies any chest pain or lower extremity edema.  Does report occasional dyspnea.  States that before his stroke though he was exercising by walking or riding bike for 1 hour daily, denies any exertional symptoms.  Reports intermittent lightheadedness, denies any syncope.  Does report episodes of palpitations where he feels like heart is racing since his stroke.  No smoking history.  He thinks his maternal grandfather had HCM and had PPM placed.  Brother has PPM.  Cardiac MRI on 07/28/2021 showed asymmetric LV hypertrophy measuring up to 24 mm in mid septum, consistent with hypertrophic cardiomyopathy reverse septal curvature subtype.  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  He was started on Eliquis given LV apical aneurysm and recent cryptogenic stroke.  Since last clinic visit, he reports that he has been  doing well.  Denies any chest pain, dyspnea, lower extremity edema.  Had 1 episode of lightheadedness when standing too quickly.  Denies any syncope.  Reports occasional palpitations.  States that BP has been well controlled at home.  Denies any bleeding issues on Eliquis.  He is still thinking about defibrillator, he is unsure if he wants to proceed.   BP Readings from Last 3 Encounters:  11/12/21 124/68  08/18/21 (!) 144/90  08/11/21 (!) 152/77     Past Medical History:  Diagnosis Date   Arthritis    Cardiomyopathy ([place])    CKD (chronic kidney disease)    Dyspnea    with exertion   Enlarged prostate    GERD (gastroesophageal reflux disease)    History of kidney stones    Hypertension    Sleep apnea    uses cpap    Past Surgical History:  Procedure Laterality Date   CARDIAC CATHETERIZATION     "years ago" - states it was clear   COLONOSCOPY     KIDNEY STONE SURGERY     LOOP RECORDER INSERTION N/A 04/06/2021   Procedure: LOOP RECORDER INSERTION;  Surgeon: Evans Lance, MD;  Location: [facility];  Service: Cardiovascular;  Laterality: N/A;   ORIF ELBOW FRACTURE Left 09/30/2018   Procedure: Open reduction internal fixation left elbow fracture with repair reconstruction and  allograft;  Surgeon: Roseanne Kaufman, MD;  Location: [facility];  Service: Orthopedics;  Laterality: Left;  90 mins   TONSILLECTOMY      Current Medications:  Current Meds  Medication Sig   acetaminophen (TYLENOL) 325 MG tablet Take 2 tablets (650 mg total) by mouth every 4 (four) hours as needed for mild pain (or temp > 37.5 C (99.5 F)).   apixaban (ELIQUIS) 5 MG TABS tablet Take 1 tablet (5 mg total) by mouth 2 (two) times daily.   atorvastatin (LIPITOR) 20 MG tablet Take 1 tablet (20 mg total) by mouth daily.   brimonidine (ALPHAGAN) 0.2 % ophthalmic solution Place 1 drop into both eyes 3 (three) times daily.   dorzolamide-timolol (COSOPT) 22.3-6.8 MG/ML ophthalmic solution Place 1 drop into both  eyes 2 (two) times daily.   latanoprost (XALATAN) 0.005 % ophthalmic solution Place 1 drop into both eyes at bedtime.   Multiple Vitamin (MULTIVITAMIN WITH MINERALS) TABS tablet Take 1 tablet by mouth daily.   perindopril (ACEON) 8 MG tablet Take 2 tablets (16 mg total) by mouth daily.   Probiotic Product (PROBIOTIC DAILY PO) Take 1 capsule by mouth daily.   ranolazine (RANEXA) 1000 MG SR tablet Take 1 tablet (1,000 mg total) by mouth at bedtime.   sodium chloride (OCEAN) 0.65 % SOLN nasal spray Place 1 spray into both nostrils as needed for congestion.   tamsulosin (FLOMAX) 0.4 MG CAPS capsule Take 2 capsules (0.8 mg total) by mouth every evening.     Allergies:   Patient has no known allergies.   Social History   Socioeconomic History   Marital status: Married    Spouse name: Not on file   Number of children: Not on file   Years of education: Not on file   Highest education level: Not on file  Occupational History   Not on file  Tobacco Use   Smoking status: Never   Smokeless tobacco: Never  Vaping Use   Vaping Use: Never used  Substance and Sexual Activity   Alcohol use: No   Drug use: No   Sexual activity: Not on file  Other Topics Concern   Not on file  Social History Narrative   Left handed    Caffeine- occasional use   Lives at home with his wife    Social Determinants of Health   Financial Resource Strain: Not on file  Food Insecurity: Not on file  Transportation Needs: Not on file  Physical Activity: Not on file  Stress: Not on file  Social Connections: Not on file     Family History: He thinks his maternal grandfather had HCM and had PPM placed.  Brother has PPM.  ROS:   Please see the history of present illness.     All other systems reviewed and are negative.  EKGs/Labs/Other Studies Reviewed:    The following studies were reviewed today:   EKG:  EKG is not ordered today.  The ekg ordered at prior clinic visit demonstrates sinus rhythm, rate 57,  first-degree AV block, right bundle branch block, Q waves in aVL, I, V5/6  Recent Labs: 04/07/2021: ALT 16 06/16/2021: Hemoglobin 15.3; Platelets 241 08/28/2021: BUN 17; Creatinine, Ser 1.67; Potassium 4.3; Sodium 141  Recent Lipid Panel    Component Value Date/Time   CHOL 81 (L) 06/16/2021 1140   TRIG 63 06/16/2021 1140   HDL 38 (L) 06/16/2021 1140   CHOLHDL 2.1 06/16/2021 1140   CHOLHDL 4.7 04/04/2021 0649   VLDL 33 04/04/2021 0649   LDLCALC 28 06/16/2021 1140    Physical Exam:    VS:  BP 124/68 (BP Location: Left Arm, Patient Position: Sitting, Cuff Size: Normal)  Pulse 61    Ht 5\' 5"  (1.651 m)    Wt 195 lb (88.5 kg)    BMI 32.45 kg/m     Wt Readings from Last 3 Encounters:  11/12/21 195 lb (88.5 kg)  08/18/21 212 lb (96.2 kg)  08/11/21 212 lb (96.2 kg)     GEN:  Well nourished, well developed in no acute distress HEENT: Normal NECK: No JVD; No carotid bruits CARDIAC: RRR, no murmurs, rubs, gallops RESPIRATORY:  Clear to auscultation without rales, wheezing or rhonchi  ABDOMEN: Soft, non-tender, non-distended MUSCULOSKELETAL:  No edema; No deformity  SKIN: Warm and dry NEUROLOGIC:  Alert and oriented x 3 PSYCHIATRIC:  Normal affect   ASSESSMENT:    1. Hypertrophic cardiomyopathy ([place])   2. Cryptogenic stroke ([place])   3. Essential hypertension   4. Stage 3b chronic kidney disease ([place])   5. Hyperlipidemia, unspecified hyperlipidemia type      PLAN:    Hypertrophic cardiomyopathy: Diagnosed age 79, followed with cardiologist in [place].  Cardiac MRI on 07/28/2021 showed asymmetric LV hypertrophy measuring up to 24 mm in mid septum, consistent with hypertrophic cardiomyopathy reverse septal curvature subtype.  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   -While no LV thrombus seen on MRI, he is at high risk for thrombus given LV apical aneurysm.  Given  recent cryptogenic stroke, concerning that the apical aneurysm is the source of his stroke and started Eliquis 5 mg twice daily -He does meet indications for ICD placement, given LV apical aneurysm and significant LGE burden on recent MRI (19% total myocardial mass).  However he is 76 year old and has had no h/o ventricular arrhythmias, and does have a loop that was placed after his stroke in June 2022 and no ventricular arrhythmias seen to date.  Referred to EP, he is considering ICD  Acute CVA: Diagnosed June 2022.  Loop recorder placed.  Given LV apical aneurysm, concern for LV thrombus as source.  Started Eliquis as above.  Continue atorvastatin  Hypertension: on perindopril 16 mg daily.  Appears controlled.  Hyperlipidemia: Continue atorvastatin 20 mg daily.  LDL 28 on 06/16/2021  CKD stage 3B: Creatinine 1.67 on 08/28/2021  RTC in 3 months   Medication Adjustments/Labs and Tests Ordered: Current medicines are reviewed at length with the patient today.  Concerns regarding medicines are outlined above.  No orders of the defined types were placed in this encounter.    No orders of the defined types were placed in this encounter.    Patient Instructions  Medication Instructions:  Your physician recommends that you continue on your current medications as directed. Please refer to the Current Medication list given to you today.  *If you need a refill on your cardiac medications before your next appointment, please call your pharmacy*   Lab Work: None If you have labs (blood work) drawn today and your tests are completely normal, you will receive your results only by: [place] (if you have MyChart) OR A paper copy in the mail If you have any lab test that is abnormal or we need to change your treatment, we will call you to review the results.   Testing/Procedures: None   Follow-Up: At [facility], you and your health needs are our priority.  As part of our  continuing mission to provide you with exceptional heart care, we have created designated Provider  Care Teams.  These Care Teams include your primary Cardiologist (physician) and Advanced Practice Providers (APPs -  Physician Assistants and Nurse Practitioners) who all work together to provide you with the care you need, when you need it.  We recommend signing up for the patient portal called "MyChart".  Sign up information is provided on this After Visit Summary.  MyChart is used to connect with patients for Virtual Visits (Telemedicine).  Patients are able to view lab/test results, encounter notes, upcoming appointments, etc.  Non-urgent messages can be sent to your provider as well.   To learn more about what you can do with MyChart, go to [email or web address].    Your next appointment:   3 month(s)  The format for your next appointment:   In Person  Provider:   Donato Heinz, MD     Other Instructions     Signed, Donato Heinz, MD  11/12/2021 5:27 PM    [facility]

## 2021-11-12 ENCOUNTER — Ambulatory Visit (INDEPENDENT_AMBULATORY_CARE_PROVIDER_SITE_OTHER): Payer: Medicare Other | Admitting: Cardiology

## 2021-11-12 ENCOUNTER — Encounter: Payer: Self-pay | Admitting: Cardiology

## 2021-11-12 ENCOUNTER — Other Ambulatory Visit: Payer: Self-pay

## 2021-11-12 VITALS — BP 124/68 | HR 61 | Ht 65.0 in | Wt 195.0 lb

## 2021-11-12 DIAGNOSIS — E785 Hyperlipidemia, unspecified: Secondary | ICD-10-CM

## 2021-11-12 DIAGNOSIS — I422 Other hypertrophic cardiomyopathy: Secondary | ICD-10-CM

## 2021-11-12 DIAGNOSIS — I10 Essential (primary) hypertension: Secondary | ICD-10-CM

## 2021-11-12 DIAGNOSIS — I639 Cerebral infarction, unspecified: Secondary | ICD-10-CM | POA: Diagnosis not present

## 2021-11-12 DIAGNOSIS — N1832 Chronic kidney disease, stage 3b: Secondary | ICD-10-CM

## 2021-11-12 NOTE — Patient Instructions (Signed)
Medication Instructions:  Your physician recommends that you continue on your current medications as directed. Please refer to the Current Medication list given to you today.  *If you need a refill on your cardiac medications before your next appointment, please call your pharmacy*   Lab Work: None If you have labs (blood work) drawn today and your tests are completely normal, you will receive your results only by: MyChart Message (if you have MyChart) OR A paper copy in the mail If you have any lab test that is abnormal or we need to change your treatment, we will call you to review the results.   Testing/Procedures: None   Follow-Up: At [facility], you and your health needs are our priority.  As part of our continuing mission to provide you with exceptional heart care, we have created designated Provider Care Teams.  These Care Teams include your primary Cardiologist (physician) and Advanced Practice Providers (APPs -  Physician Assistants and Nurse Practitioners) who all work together to provide you with the care you need, when you need it.  We recommend signing up for the patient portal called "MyChart".  Sign up information is provided on this After Visit Summary.  MyChart is used to connect with patients for Virtual Visits (Telemedicine).  Patients are able to view lab/test results, encounter notes, upcoming appointments, etc.  Non-urgent messages can be sent to your provider as well.   To learn more about what you can do with MyChart, go to [email or web address].    Your next appointment:   3 month(s)  The format for your next appointment:   In Person  Provider:   Little Ishikawa, MD     Other Instructions

## 2021-11-25 ENCOUNTER — Encounter: Payer: Self-pay | Admitting: Internal Medicine

## 2021-11-25 ENCOUNTER — Ambulatory Visit (INDEPENDENT_AMBULATORY_CARE_PROVIDER_SITE_OTHER): Payer: Medicare Other

## 2021-11-25 DIAGNOSIS — I639 Cerebral infarction, unspecified: Secondary | ICD-10-CM | POA: Diagnosis not present

## 2021-11-25 LAB — CUP PACEART REMOTE DEVICE CHECK
Date Time Interrogation Session: 20230124230333
Implantable Pulse Generator Implant Date: 20220606

## 2021-12-04 NOTE — Progress Notes (Signed)
Carelink Summary Report / Loop Recorder 

## 2021-12-08 ENCOUNTER — Encounter: Payer: Self-pay | Admitting: Cardiology

## 2021-12-17 ENCOUNTER — Ambulatory Visit (INDEPENDENT_AMBULATORY_CARE_PROVIDER_SITE_OTHER): Payer: Medicare Other | Admitting: Cardiology

## 2021-12-17 ENCOUNTER — Other Ambulatory Visit: Payer: Self-pay

## 2021-12-17 ENCOUNTER — Encounter: Payer: Self-pay | Admitting: Cardiology

## 2021-12-17 VITALS — BP 136/84 | HR 67 | Ht 65.0 in | Wt 199.6 lb

## 2021-12-17 DIAGNOSIS — I639 Cerebral infarction, unspecified: Secondary | ICD-10-CM

## 2021-12-17 DIAGNOSIS — I10 Essential (primary) hypertension: Secondary | ICD-10-CM

## 2021-12-17 DIAGNOSIS — I422 Other hypertrophic cardiomyopathy: Secondary | ICD-10-CM

## 2021-12-17 DIAGNOSIS — E785 Hyperlipidemia, unspecified: Secondary | ICD-10-CM

## 2021-12-17 NOTE — Progress Notes (Signed)
Cardiology Office Note:    Date:  12/17/2021   ID:  Bryan Crane, DOB 05-23-1946, MRN [id number]  PCP:  Allie Dimmer, MD  Cardiologist:  Donato Heinz, MD  Electrophysiologist:  None   Referring MD: Allie Dimmer, MD   No chief complaint on file.    History of Present Illness:    Bryan Crane is a 76 y.o. male with a hx of hypertrophic cardiomyopathy, CKD stage III, OSA, hypertension, CVA who presents for follow-up.  He was referred by Dr. Lorne Skeens for evaluation of hypertrophic cardiomyopathy, he was admitted 04/03/2021 with acute CVA.  Echocardiogram showed EF 60 to 123456, normal diastolic function, normal RV function, no significant valvular disease.  Loop recorder was placed for work-up of cryptogenic stroke.    Reports he was diagnosed with hypertrophic cardiomyopathy at age 10.  Has followed with Dr.Gooray in [place], [place] since that time.  He now lives in [place].  He denies any chest pain or lower extremity edema.  Does report occasional dyspnea.  States that before his stroke though he was exercising by walking or riding bike for 1 hour daily, denies any exertional symptoms.  Reports intermittent lightheadedness, denies any syncope.  Does report episodes of palpitations where he feels like heart is racing since his stroke.  No smoking history.  He thinks his maternal grandfather had HCM and had PPM placed.  Brother has PPM.  Cardiac MRI on 07/28/2021 showed asymmetric LV hypertrophy measuring up to 24 mm in mid septum, consistent with hypertrophic cardiomyopathy reverse septal curvature subtype.  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  He was started on Eliquis given LV apical aneurysm and recent cryptogenic stroke.  Since last clinic visit, he reports that he has been doing well.  He started doing physical therapy 2  days/week.  Continues to have issues with his balance.  Reports some lightheadedness but denies any syncope.  Denies any chest pain, dyspnea, lower extremity edema.  Reports occasionally feels like heart is racing.  Reports had heart rate elevated during PT yesterday up to 120s.  He denies any bleeding issues on Eliquis.  BP Readings from Last 3 Encounters:  12/17/21 136/84  11/12/21 124/68  08/18/21 (!) 144/90     Past Medical History:  Diagnosis Date   Arthritis    Cardiomyopathy ([place])    CKD (chronic kidney disease)    Dyspnea    with exertion   Enlarged prostate    GERD (gastroesophageal reflux disease)    History of kidney stones    Hypertension    Sleep apnea    uses cpap    Past Surgical History:  Procedure Laterality Date   CARDIAC CATHETERIZATION     "years ago" - states it was clear   COLONOSCOPY     KIDNEY STONE SURGERY     LOOP RECORDER INSERTION N/A 04/06/2021   Procedure: LOOP RECORDER INSERTION;  Surgeon: Evans Lance, MD;  Location: [facility];  Service: Cardiovascular;  Laterality: N/A;   ORIF ELBOW FRACTURE Left 09/30/2018   Procedure: Open reduction internal fixation left elbow fracture with repair reconstruction and  allograft;  Surgeon: Roseanne Kaufman, MD;  Location: [facility];  Service: Orthopedics;  Laterality: Left;  90 mins   TONSILLECTOMY      Current Medications: Current Meds  Medication Sig   acetaminophen (TYLENOL) 325 MG tablet Take  2 tablets (650 mg total) by mouth every 4 (four) hours as needed for mild pain (or temp > 37.5 C (99.5 F)).   apixaban (ELIQUIS) 5 MG TABS tablet Take 1 tablet (5 mg total) by mouth 2 (two) times daily.   atorvastatin (LIPITOR) 20 MG tablet Take 1 tablet (20 mg total) by mouth daily.   brimonidine (ALPHAGAN) 0.2 % ophthalmic solution Place 1 drop into both eyes 3 (three) times daily.   dorzolamide-timolol (COSOPT) 22.3-6.8 MG/ML ophthalmic solution Place 1 drop into both eyes 2 (two) times daily.    latanoprost (XALATAN) 0.005 % ophthalmic solution Place 1 drop into both eyes at bedtime.   Multiple Vitamin (MULTIVITAMIN WITH MINERALS) TABS tablet Take 1 tablet by mouth daily.   perindopril (ACEON) 8 MG tablet Take 2 tablets (16 mg total) by mouth daily.   Probiotic Product (PROBIOTIC DAILY PO) Take 1 capsule by mouth daily.   ranolazine (RANEXA) 1000 MG SR tablet Take 1 tablet (1,000 mg total) by mouth at bedtime.   sodium chloride (OCEAN) 0.65 % SOLN nasal spray Place 1 spray into both nostrils as needed for congestion.   tamsulosin (FLOMAX) 0.4 MG CAPS capsule Take 2 capsules (0.8 mg total) by mouth every evening.     Allergies:   Patient has no known allergies.   Social History   Socioeconomic History   Marital status: Married    Spouse name: Not on file   Number of children: Not on file   Years of education: Not on file   Highest education level: Not on file  Occupational History   Not on file  Tobacco Use   Smoking status: Never   Smokeless tobacco: Never  Vaping Use   Vaping Use: Never used  Substance and Sexual Activity   Alcohol use: No   Drug use: No   Sexual activity: Not on file  Other Topics Concern   Not on file  Social History Narrative   Left handed    Caffeine- occasional use   Lives at home with his wife    Social Determinants of Health   Financial Resource Strain: Not on file  Food Insecurity: Not on file  Transportation Needs: Not on file  Physical Activity: Not on file  Stress: Not on file  Social Connections: Not on file     Family History: He thinks his maternal grandfather had HCM and had PPM placed.  Brother has PPM.  ROS:   Please see the history of present illness.     All other systems reviewed and are negative.  EKGs/Labs/Other Studies Reviewed:    The following studies were reviewed today:   EKG:  EKG is not ordered today.  The ekg ordered at prior clinic visit demonstrates sinus rhythm, rate 57, first-degree AV block, right  bundle branch block, Q waves in aVL, I, V5/6  Recent Labs: 04/07/2021: ALT 16 06/16/2021: Hemoglobin 15.3; Platelets 241 08/28/2021: BUN 17; Creatinine, Ser 1.67; Potassium 4.3; Sodium 141  Recent Lipid Panel    Component Value Date/Time   CHOL 81 (L) 06/16/2021 1140   TRIG 63 06/16/2021 1140   HDL 38 (L) 06/16/2021 1140   CHOLHDL 2.1 06/16/2021 1140   CHOLHDL 4.7 04/04/2021 0649   VLDL 33 04/04/2021 0649   LDLCALC 28 06/16/2021 1140    Physical Exam:    VS:  BP 136/84    Pulse 67    Ht 5\' 5"  (1.651 m)    Wt 199 lb 9.6 oz (90.5 kg)  SpO2 98%    BMI 33.22 kg/m     Wt Readings from Last 3 Encounters:  12/17/21 199 lb 9.6 oz (90.5 kg)  11/12/21 195 lb (88.5 kg)  08/18/21 212 lb (96.2 kg)     GEN:  Well nourished, well developed in no acute distress HEENT: Normal NECK: No JVD; No carotid bruits CARDIAC: RRR, no murmurs, rubs, gallops RESPIRATORY:  Clear to auscultation without rales, wheezing or rhonchi  ABDOMEN: Soft, non-tender, non-distended MUSCULOSKELETAL:  No edema; No deformity  SKIN: Warm and dry NEUROLOGIC:  Alert and oriented x 3 PSYCHIATRIC:  Normal affect   ASSESSMENT:    1. Hypertrophic cardiomyopathy ([place])   2. Acute CVA (cerebrovascular accident) ([place])   3. Essential hypertension   4. Hyperlipidemia, unspecified hyperlipidemia type       PLAN:    Hypertrophic cardiomyopathy: Diagnosed age 45, followed with cardiologist in [place].  Cardiac MRI on 07/28/2021 showed asymmetric LV hypertrophy measuring up to 24 mm in mid septum, consistent with hypertrophic cardiomyopathy reverse septal curvature subtype.  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   -While no LV thrombus seen on MRI, he is at high risk for thrombus given LV apical aneurysm.  Given recent cryptogenic stroke, concerning that the apical aneurysm is the source of his stroke and started  Eliquis 5 mg twice daily -He does meet indications for ICD placement, given LV apical aneurysm and significant LGE burden on recent MRI (19% total myocardial mass).  However he is 76 year old and has had no h/o ventricular arrhythmias, and does have a loop that was placed after his stroke in June 2022 and no ventricular arrhythmias seen to date.  Referred to EP, he is considering ICD  Acute CVA: Diagnosed June 2022.  Loop recorder placed.  Given LV apical aneurysm, concern for LV thrombus as source.  Started Eliquis as above.  Continue atorvastatin  Hypertension: on perindopril 16 mg daily.  Appears controlled.  Hyperlipidemia: Continue atorvastatin 20 mg daily.  LDL 28 on 06/16/2021  CKD stage 3B: Creatinine 1.67 on 08/28/2021  RTC in 4 months   Medication Adjustments/Labs and Tests Ordered: Current medicines are reviewed at length with the patient today.  Concerns regarding medicines are outlined above.  No orders of the defined types were placed in this encounter.    No orders of the defined types were placed in this encounter.    Patient Instructions   Follow-Up: At [facility], you and your health needs are our priority.  As part of our continuing mission to provide you with exceptional heart care, we have created designated Provider Care Teams.  These Care Teams include your primary Cardiologist (physician) and Advanced Practice Providers (APPs -  Physician Assistants and Nurse Practitioners) who all work together to provide you with the care you need, when you need it.  We recommend signing up for the patient portal called "MyChart".  Sign up information is provided on this After Visit Summary.  MyChart is used to connect with patients for Virtual Visits (Telemedicine).  Patients are able to view lab/test results, encounter notes, upcoming appointments, etc.  Non-urgent messages can be sent to your provider as well.   To learn more about what you can do with MyChart, go to  [email or web address].    Your next appointment:   4 month(s)  The format for your next appointment:   In  Person  Provider:   Donato Heinz, MD       Signed, Donato Heinz, MD  12/17/2021 5:41 PM    [place]

## 2021-12-17 NOTE — Patient Instructions (Signed)
°  Follow-Up: At [facility], you and your health needs are our priority.  As part of our continuing mission to provide you with exceptional heart care, we have created designated Provider Care Teams.  These Care Teams include your primary Cardiologist (physician) and Advanced Practice Providers (APPs -  Physician Assistants and Nurse Practitioners) who all work together to provide you with the care you need, when you need it.  We recommend signing up for the patient portal called "MyChart".  Sign up information is provided on this After Visit Summary.  MyChart is used to connect with patients for Virtual Visits (Telemedicine).  Patients are able to view lab/test results, encounter notes, upcoming appointments, etc.  Non-urgent messages can be sent to your provider as well.   To learn more about what you can do with MyChart, go to [email or web address].    Your next appointment:   4 month(s)  The format for your next appointment:   In Person  Provider:   Little Ishikawa, MD

## 2021-12-28 ENCOUNTER — Ambulatory Visit (INDEPENDENT_AMBULATORY_CARE_PROVIDER_SITE_OTHER): Payer: Medicare Other

## 2021-12-28 DIAGNOSIS — I422 Other hypertrophic cardiomyopathy: Secondary | ICD-10-CM | POA: Diagnosis not present

## 2021-12-28 LAB — CUP PACEART REMOTE DEVICE CHECK
Date Time Interrogation Session: 20230226230507
Implantable Pulse Generator Implant Date: 20220606

## 2022-01-04 NOTE — Progress Notes (Signed)
Carelink Summary Report / Loop Recorder 

## 2022-02-01 ENCOUNTER — Ambulatory Visit (INDEPENDENT_AMBULATORY_CARE_PROVIDER_SITE_OTHER): Payer: Medicare Other

## 2022-02-01 DIAGNOSIS — I422 Other hypertrophic cardiomyopathy: Secondary | ICD-10-CM | POA: Diagnosis not present

## 2022-02-02 LAB — CUP PACEART REMOTE DEVICE CHECK
Date Time Interrogation Session: 20230331230527
Implantable Pulse Generator Implant Date: 20220606

## 2022-02-02 NOTE — Progress Notes (Signed)
?Cardiology Office Note:   ? ?Date:  02/03/2022  ? ?ID:  Bryan Crane, DOB 12/16/1945, MRN [id number] ? ?PCP:  Allie Dimmer, MD  ?Cardiologist:  Donato Heinz, MD  ?Electrophysiologist:  None  ? ?Referring MD: Allie Dimmer, MD  ? ?Chief Complaint  ?Patient presents with  ? Cardiomyopathy  ? ? ?History of Present Illness:   ? ?Bryan Crane is a 76 y.o. male with a hx of hypertrophic cardiomyopathy, CKD stage III, OSA, hypertension, CVA who presents for follow-up.  He was referred by Dr. Lorne Skeens for evaluation of hypertrophic cardiomyopathy, he was admitted 04/03/2021 with acute CVA.  Echocardiogram showed EF 60 to 123456, normal diastolic function, normal RV function, no significant valvular disease.  Loop recorder was placed for work-up of cryptogenic stroke.   ? ?Reports he was diagnosed with hypertrophic cardiomyopathy at age 64.  Has followed with Dr.Gooray in [place], [place] since that time.  He now lives in [place].  He denies any chest pain or lower extremity edema.  Does report occasional dyspnea.  States that before his stroke though he was exercising by walking or riding bike for 1 hour daily, denies any exertional symptoms.  Reports intermittent lightheadedness, denies any syncope.  Does report episodes of palpitations where he feels like heart is racing since his stroke.  No smoking history.  He thinks his maternal grandfather had HCM and had PPM placed.  Brother has PPM. ? ?Cardiac MRI on 07/28/2021 showed asymmetric LV hypertrophy measuring up to 24 mm in mid septum, consistent with hypertrophic cardiomyopathy reverse septal curvature subtype.  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  He was started on Eliquis given LV apical aneurysm and recent cryptogenic stroke. ? ?Since last clinic visit, he has he reports that he has been doing well.  Had fall  in February.  Was checking out of his hotel when he fell while chasing a cart.  Had significant bruising on leg but has been improving.  Did not hit his head.  He denies any chest pain, dyspnea, headedness, syncope, lower extremity edema.  Reports occasional palpitations. ? ?BP Readings from Last 3 Encounters:  ?02/03/22 116/76  ?12/17/21 136/84  ?11/12/21 124/68  ? ? ? ?Past Medical History:  ?Diagnosis Date  ? Arthritis   ? Cardiomyopathy ([place])   ? CKD (chronic kidney disease)   ? Dyspnea   ? with exertion  ? Enlarged prostate   ? GERD (gastroesophageal reflux disease)   ? History of kidney stones   ? Hypertension   ? Sleep apnea   ? uses cpap  ? ? ?Past Surgical History:  ?Procedure Laterality Date  ? CARDIAC CATHETERIZATION    ? "years ago" - states it was clear  ? COLONOSCOPY    ? KIDNEY STONE SURGERY    ? LOOP RECORDER INSERTION N/A 04/06/2021  ? Procedure: LOOP RECORDER INSERTION;  Surgeon: Evans Lance, MD;  Location: [facility];  Service: Cardiovascular;  Laterality: N/A;  ? ORIF ELBOW FRACTURE Left 09/30/2018  ? Procedure: Open reduction internal fixation left elbow fracture with repair reconstruction and  allograft;  Surgeon: Roseanne Kaufman, MD;  Location: [facility];  Service: Orthopedics;  Laterality: Left;  90 mins  ? TONSILLECTOMY    ? ? ?Current Medications: ?Current Meds  ?Medication Sig  ? acetaminophen (TYLENOL) 325 MG tablet Take 2 tablets (650 mg total)  by mouth every 4 (four) hours as needed for mild pain (or temp > 37.5 C (99.5 F)).  ? apixaban (ELIQUIS) 5 MG TABS tablet Take 1 tablet (5 mg total) by mouth 2 (two) times daily.  ? atorvastatin (LIPITOR) 20 MG tablet Take 1 tablet (20 mg total) by mouth daily.  ? brimonidine (ALPHAGAN) 0.2 % ophthalmic solution Place 1 drop into both eyes 3 (three) times daily.  ? dorzolamide-timolol (COSOPT) 22.3-6.8 MG/ML ophthalmic solution Place 1 drop into both eyes 2 (two) times daily.  ? latanoprost (XALATAN) 0.005 % ophthalmic solution Place 1 drop  into both eyes at bedtime.  ? Multiple Vitamin (MULTIVITAMIN WITH MINERALS) TABS tablet Take 1 tablet by mouth daily.  ? perindopril (ACEON) 8 MG tablet Take 2 tablets (16 mg total) by mouth daily.  ? Probiotic Product (PROBIOTIC DAILY PO) Take 1 capsule by mouth daily.  ? ranolazine (RANEXA) 1000 MG SR tablet Take 1 tablet (1,000 mg total) by mouth at bedtime.  ? tamsulosin (FLOMAX) 0.4 MG CAPS capsule Take 2 capsules (0.8 mg total) by mouth every evening.  ? [DISCONTINUED] lansoprazole (PREVACID) 30 MG capsule Take 30 mg by mouth daily.  ?  ? ?Allergies:   Patient has no known allergies.  ? ?Social History  ? ?Socioeconomic History  ? Marital status: Married  ?  Spouse name: Not on file  ? Number of children: Not on file  ? Years of education: Not on file  ? Highest education level: Not on file  ?Occupational History  ? Not on file  ?Tobacco Use  ? Smoking status: Never  ? Smokeless tobacco: Never  ?Vaping Use  ? Vaping Use: Never used  ?Substance and Sexual Activity  ? Alcohol use: No  ? Drug use: No  ? Sexual activity: Not on file  ?Other Topics Concern  ? Not on file  ?Social History Narrative  ? Left handed   ? Caffeine- occasional use  ? Lives at home with his wife   ? ?Social Determinants of Health  ? ?Financial Resource Strain: Not on file  ?Food Insecurity: Not on file  ?Transportation Needs: Not on file  ?Physical Activity: Not on file  ?Stress: Not on file  ?Social Connections: Not on file  ?  ? ?Family History: ?He thinks his maternal grandfather had HCM and had PPM placed.  Brother has PPM. ? ?ROS:   ?Please see the history of present illness.    ? All other systems reviewed and are negative. ? ?EKGs/Labs/Other Studies Reviewed:   ? ?The following studies were reviewed today: ? ? ?EKG:   ?02/03/22: Sinus rhythm first-degree AV block, right bundle branch block, Q waves in I, aVL, V 4-6 ? ?Recent Labs: ?04/07/2021: ALT 16 ?06/16/2021: Hemoglobin 15.3; Platelets 241 ?08/28/2021: BUN 17; Creatinine, Ser 1.67;  Potassium 4.3; Sodium 141  ?Recent Lipid Panel ?   ?Component Value Date/Time  ? CHOL 81 (L) 06/16/2021 1140  ? TRIG 63 06/16/2021 1140  ? HDL 38 (L) 06/16/2021 1140  ? CHOLHDL 2.1 06/16/2021 1140  ? CHOLHDL 4.7 04/04/2021 0649  ? VLDL 33 04/04/2021 0649  ? [place] 28 06/16/2021 1140  ? ? ?Physical Exam:   ? ?VS:  BP 116/76   Pulse 89   Ht 5\' 5"  (1.651 m)   Wt 192 lb 6.4 oz (87.3 kg)   SpO2 95%   BMI 32.02 kg/m?    ? ?Wt Readings from Last 3 Encounters:  ?02/03/22 192 lb 6.4 oz (87.3 kg)  ?12/17/21  199 lb 9.6 oz (90.5 kg)  ?11/12/21 195 lb (88.5 kg)  ?  ? ?GEN:  Well nourished, well developed in no acute distress ?HEENT: Normal ?NECK: No JVD; No carotid bruits ?CARDIAC: RRR, no murmurs, rubs, gallops ?RESPIRATORY:  Clear to auscultation without rales, wheezing or rhonchi  ?ABDOMEN: Soft, non-tender, non-distended ?MUSCULOSKELETAL:  No edema; No deformity  ?SKIN: Warm and dry ?NEUROLOGIC:  Alert and oriented x 3 ?PSYCHIATRIC:  Normal affect  ? ?ASSESSMENT:   ? ?1. Hypertrophic cardiomyopathy ([facility])   ?2. Acute CVA (cerebrovascular accident) ([facility])   ?3. Essential hypertension   ?4. Hyperlipidemia, unspecified hyperlipidemia type   ?5. Stage 3b chronic kidney disease ([facility])   ? ? ? ? ? ?PLAN:   ? ?Hypertrophic cardiomyopathy: Diagnosed age 50, followed with cardiologist in [place].  Cardiac MRI on 07/28/2021 showed asymmetric LV hypertrophy measuring up to 24 mm in mid septum, consistent with hypertrophic cardiomyopathy reverse septal curvature subtype.  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   ?-While no LV thrombus seen on MRI, he is at high risk for thrombus given LV apical aneurysm.  Given recent cryptogenic stroke, concerning that the apical aneurysm is the source of his stroke and started Eliquis 5 mg twice daily.  He has had difficulty affording Eliquis, application for financial assistance with  rejected.  He is considering switching to warfarin.  Will let us know ?-He does meet indications for ICD placement, given LV apical aneurysm and significant LGE burden on recent MRI (19% total myocardial mas

## 2022-02-03 ENCOUNTER — Telehealth: Payer: Self-pay

## 2022-02-03 ENCOUNTER — Other Ambulatory Visit: Payer: Medicare Other | Admitting: *Deleted

## 2022-02-03 ENCOUNTER — Encounter: Payer: Self-pay | Admitting: Cardiology

## 2022-02-03 ENCOUNTER — Ambulatory Visit (INDEPENDENT_AMBULATORY_CARE_PROVIDER_SITE_OTHER): Payer: Medicare Other | Admitting: Cardiology

## 2022-02-03 VITALS — BP 116/76 | HR 89 | Ht 65.0 in | Wt 192.4 lb

## 2022-02-03 DIAGNOSIS — E785 Hyperlipidemia, unspecified: Secondary | ICD-10-CM | POA: Diagnosis not present

## 2022-02-03 DIAGNOSIS — I10 Essential (primary) hypertension: Secondary | ICD-10-CM | POA: Diagnosis not present

## 2022-02-03 DIAGNOSIS — I422 Other hypertrophic cardiomyopathy: Secondary | ICD-10-CM

## 2022-02-03 DIAGNOSIS — I639 Cerebral infarction, unspecified: Secondary | ICD-10-CM | POA: Diagnosis not present

## 2022-02-03 DIAGNOSIS — N1832 Chronic kidney disease, stage 3b: Secondary | ICD-10-CM

## 2022-02-03 NOTE — Patient Instructions (Signed)
Medication Instructions:  Your physician recommends that you continue on your current medications as directed. Please refer to the Current Medication list given to you today.  *If you need a refill on your cardiac medications before your next appointment, please call your pharmacy*  Follow-Up: At [facility], you and your health needs are our priority.  As part of our continuing mission to provide you with exceptional heart care, we have created designated Provider Care Teams.  These Care Teams include your primary Cardiologist (physician) and Advanced Practice Providers (APPs -  Physician Assistants and Nurse Practitioners) who all work together to provide you with the care you need, when you need it.  We recommend signing up for the patient portal called "MyChart".  Sign up information is provided on this After Visit Summary.  MyChart is used to connect with patients for Virtual Visits (Telemedicine).  Patients are able to view lab/test results, encounter notes, upcoming appointments, etc.  Non-urgent messages can be sent to your provider as well.   To learn more about what you can do with MyChart, go to [email or web address].    Your next appointment:   6 month(s)  The format for your next appointment:   In Person  Provider:   Christopher L Schumann, MD       

## 2022-02-03 NOTE — Telephone Encounter (Signed)
Returned call to Pt. ? ?Pt would like to schedule ICD implant for February 22, 2022 at 1:30 pm ? ?He is still in [place] and will stop by [employer] office for labs/instruction letter/soap ? ?Will determine if Dr. Lovena Le intends to remove loop monitor during procedure  ? ?

## 2022-02-04 LAB — BASIC METABOLIC PANEL
BUN/Creatinine Ratio: 9 — ABNORMAL LOW (ref 10–24)
BUN: 14 mg/dL (ref 8–27)
CO2: 22 mmol/L (ref 20–29)
Calcium: 9 mg/dL (ref 8.6–10.2)
Chloride: 107 mmol/L — ABNORMAL HIGH (ref 96–106)
Creatinine, Ser: 1.61 mg/dL — ABNORMAL HIGH (ref 0.76–1.27)
Glucose: 103 mg/dL — ABNORMAL HIGH (ref 70–99)
Potassium: 4.1 mmol/L (ref 3.5–5.2)
Sodium: 146 mmol/L — ABNORMAL HIGH (ref 134–144)
eGFR: 44 mL/min/{1.73_m2} — ABNORMAL LOW (ref >59)

## 2022-02-04 LAB — CBC WITH DIFFERENTIAL/PLATELET
Basophils Absolute: 0 10*3/uL (ref 0.0–0.2)
Basos: 0 %
EOS (ABSOLUTE): 0.1 10*3/uL (ref 0.0–0.4)
Eos: 1 %
Hematocrit: 41.8 % (ref 37.5–51.0)
Hemoglobin: 14.6 g/dL (ref 13.0–17.7)
Immature Grans (Abs): 0 10*3/uL (ref 0.0–0.1)
Immature Granulocytes: 0 %
Lymphocytes Absolute: 1.4 10*3/uL (ref 0.7–3.1)
Lymphs: 13 %
MCH: 32.3 pg (ref 26.6–33.0)
MCHC: 34.9 g/dL (ref 31.5–35.7)
MCV: 93 fL (ref 79–97)
Monocytes Absolute: 1.1 10*3/uL — ABNORMAL HIGH (ref 0.1–0.9)
Monocytes: 10 %
Neutrophils Absolute: 8.3 10*3/uL — ABNORMAL HIGH (ref 1.4–7.0)
Neutrophils: 76 %
Platelets: 230 10*3/uL (ref 150–450)
RBC: 4.52 x10E6/uL (ref 4.14–5.80)
RDW: 12.8 % (ref 11.6–15.4)
WBC: 10.9 10*3/uL — ABNORMAL HIGH (ref 3.4–10.8)

## 2022-02-04 NOTE — Telephone Encounter (Signed)
Per Dr. Raiford Noble remove loop at time of ICD implant. ?

## 2022-02-16 NOTE — Progress Notes (Signed)
Carelink Summary Report / Loop Recorder 

## 2022-02-18 ENCOUNTER — Telehealth: Payer: Self-pay | Admitting: Cardiology

## 2022-02-18 NOTE — Telephone Encounter (Signed)
Pt states that he has upcoming procedure on 4/24. Pt states he was given paperwork to let him know what he needs to do prior to procedure but he has misplaced it. Pt would like a callback and provided this info. Please advise ?

## 2022-02-18 NOTE — Telephone Encounter (Signed)
Spoke with pt and reviewed instructions for upcoming ICD implant.  Advised pt instructions are on Mychart as well.  Told pt if he is unable to find them on Mychart, send a Mychart message letting us know and we can copy and paste them so they are easy to find.  Pt appreciative for call.  ?

## 2022-02-19 NOTE — Pre-Procedure Instructions (Addendum)
Instructed patient on the following items: ?Arrival time 1130 ?Nothing to eat or drink after midnight ?No meds AM of procedure ?Responsible person to drive you home and stay with you for 24 hrs ?Wash with special soap night before and morning of procedure ?If on anti-coagulant drug instructions Eliquis- last dose 4/21  ?

## 2022-02-22 ENCOUNTER — Ambulatory Visit (HOSPITAL_COMMUNITY): Payer: Medicare Other

## 2022-02-22 ENCOUNTER — Encounter (HOSPITAL_COMMUNITY)
Admission: RE | Disposition: A | Discharge status: Home / Self Care | Payer: Self-pay | Source: Home / Self Care | Attending: Internal Medicine

## 2022-02-22 ENCOUNTER — Other Ambulatory Visit: Payer: Self-pay

## 2022-02-22 ENCOUNTER — Ambulatory Visit (HOSPITAL_COMMUNITY)
Admission: RE | Admit: 2022-02-22 | Discharge: 2022-02-22 | Disposition: A | Discharge status: Home / Self Care | Payer: Medicare Other | Attending: Internal Medicine | Admitting: Internal Medicine

## 2022-02-22 DIAGNOSIS — N189 Chronic kidney disease, unspecified: Secondary | ICD-10-CM | POA: Insufficient documentation

## 2022-02-22 DIAGNOSIS — I130 Hypertensive heart and chronic kidney disease with heart failure and stage 1 through stage 4 chronic kidney disease, or unspecified chronic kidney disease: Secondary | ICD-10-CM | POA: Diagnosis not present

## 2022-02-22 DIAGNOSIS — I421 Obstructive hypertrophic cardiomyopathy: Secondary | ICD-10-CM

## 2022-02-22 DIAGNOSIS — I255 Ischemic cardiomyopathy: Secondary | ICD-10-CM | POA: Diagnosis not present

## 2022-02-22 DIAGNOSIS — I440 Atrioventricular block, first degree: Secondary | ICD-10-CM | POA: Diagnosis not present

## 2022-02-22 DIAGNOSIS — I4510 Unspecified right bundle-branch block: Secondary | ICD-10-CM | POA: Insufficient documentation

## 2022-02-22 DIAGNOSIS — I5032 Chronic diastolic (congestive) heart failure: Secondary | ICD-10-CM | POA: Insufficient documentation

## 2022-02-22 DIAGNOSIS — I422 Other hypertrophic cardiomyopathy: Secondary | ICD-10-CM | POA: Insufficient documentation

## 2022-02-22 HISTORY — PX: ICD IMPLANT: EP1208

## 2022-02-22 IMAGING — DX DG CHEST 2V
2 series · 2 of 2 positions shown · non-contrast
Comparison: None.

CLINICAL DATA: Placement of pacemaker

EXAM:
CHEST - 2 VIEW

[w chest pa]
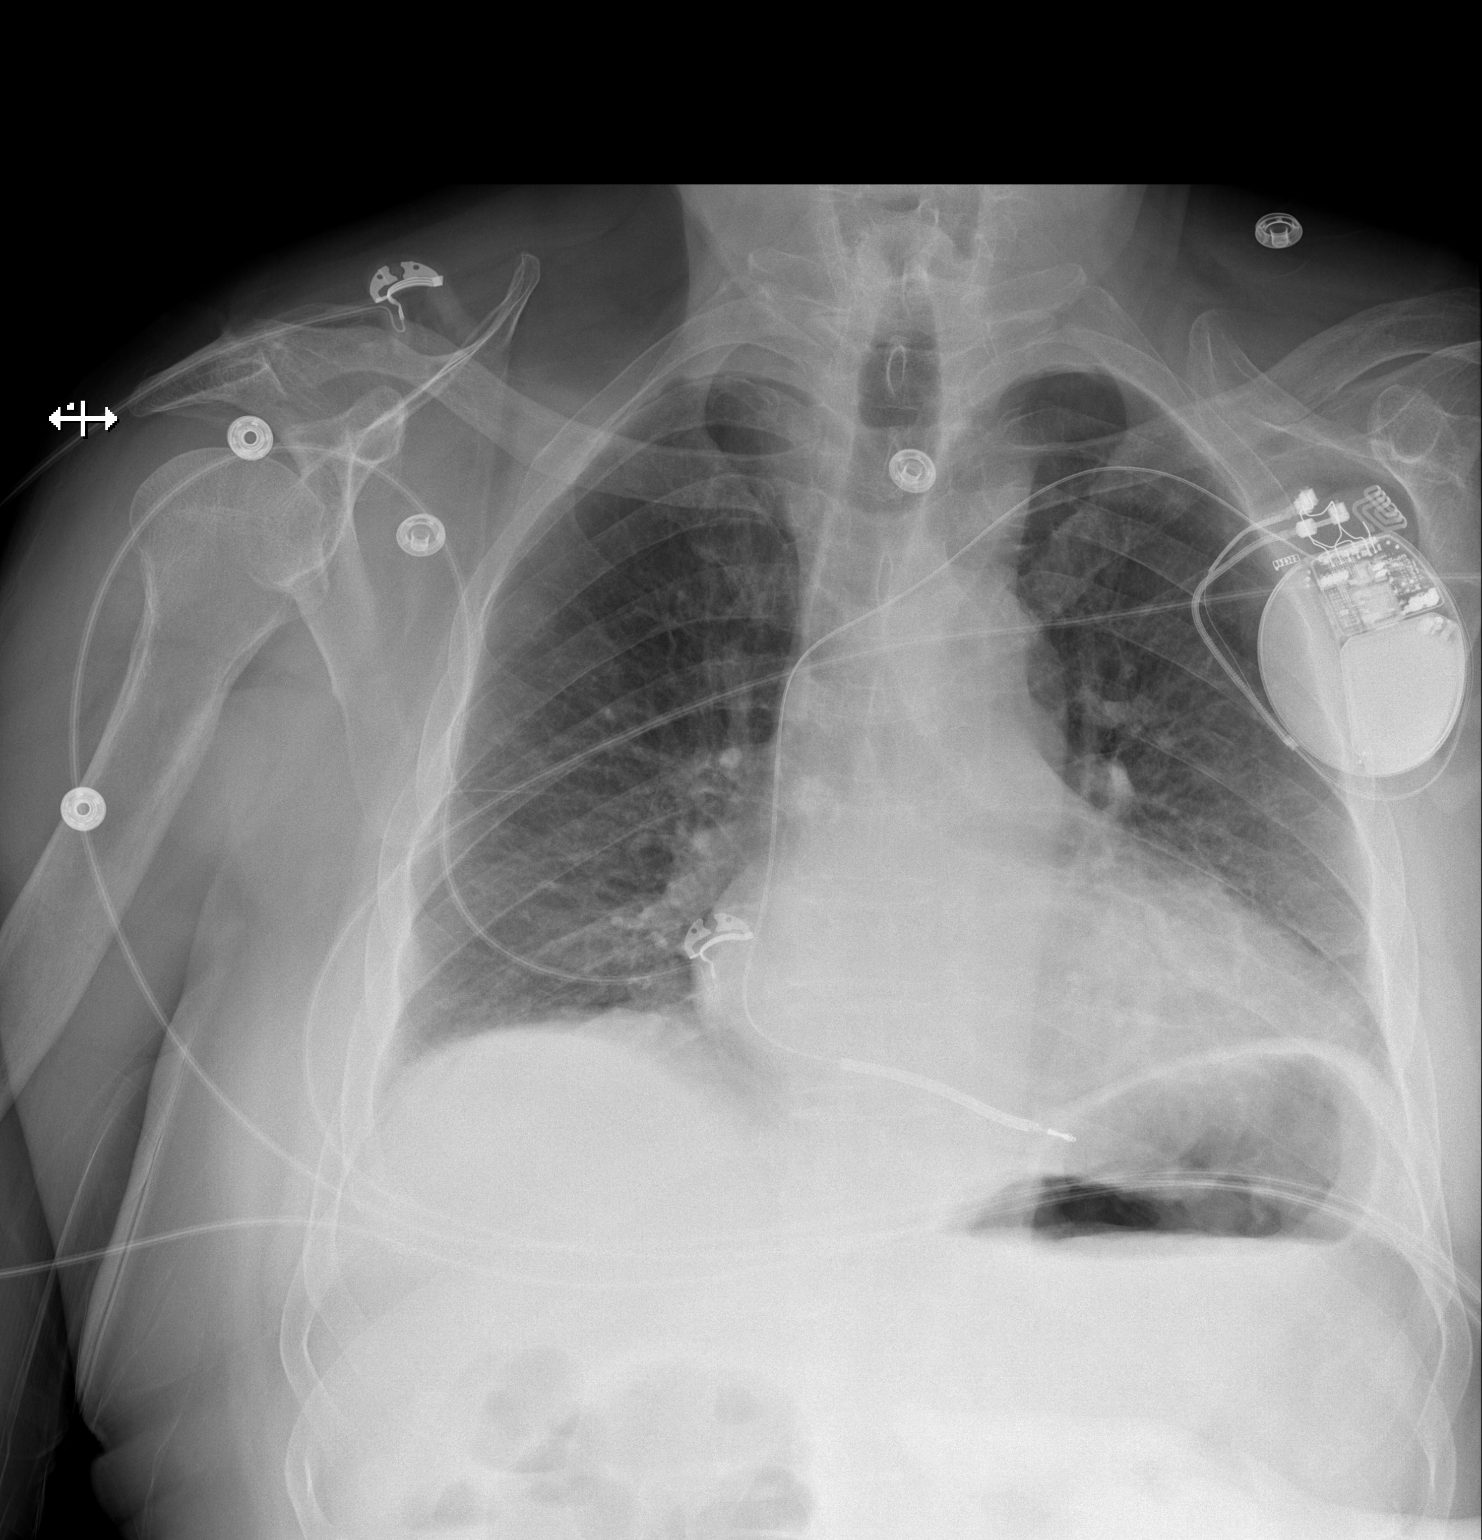

[w chest lat]
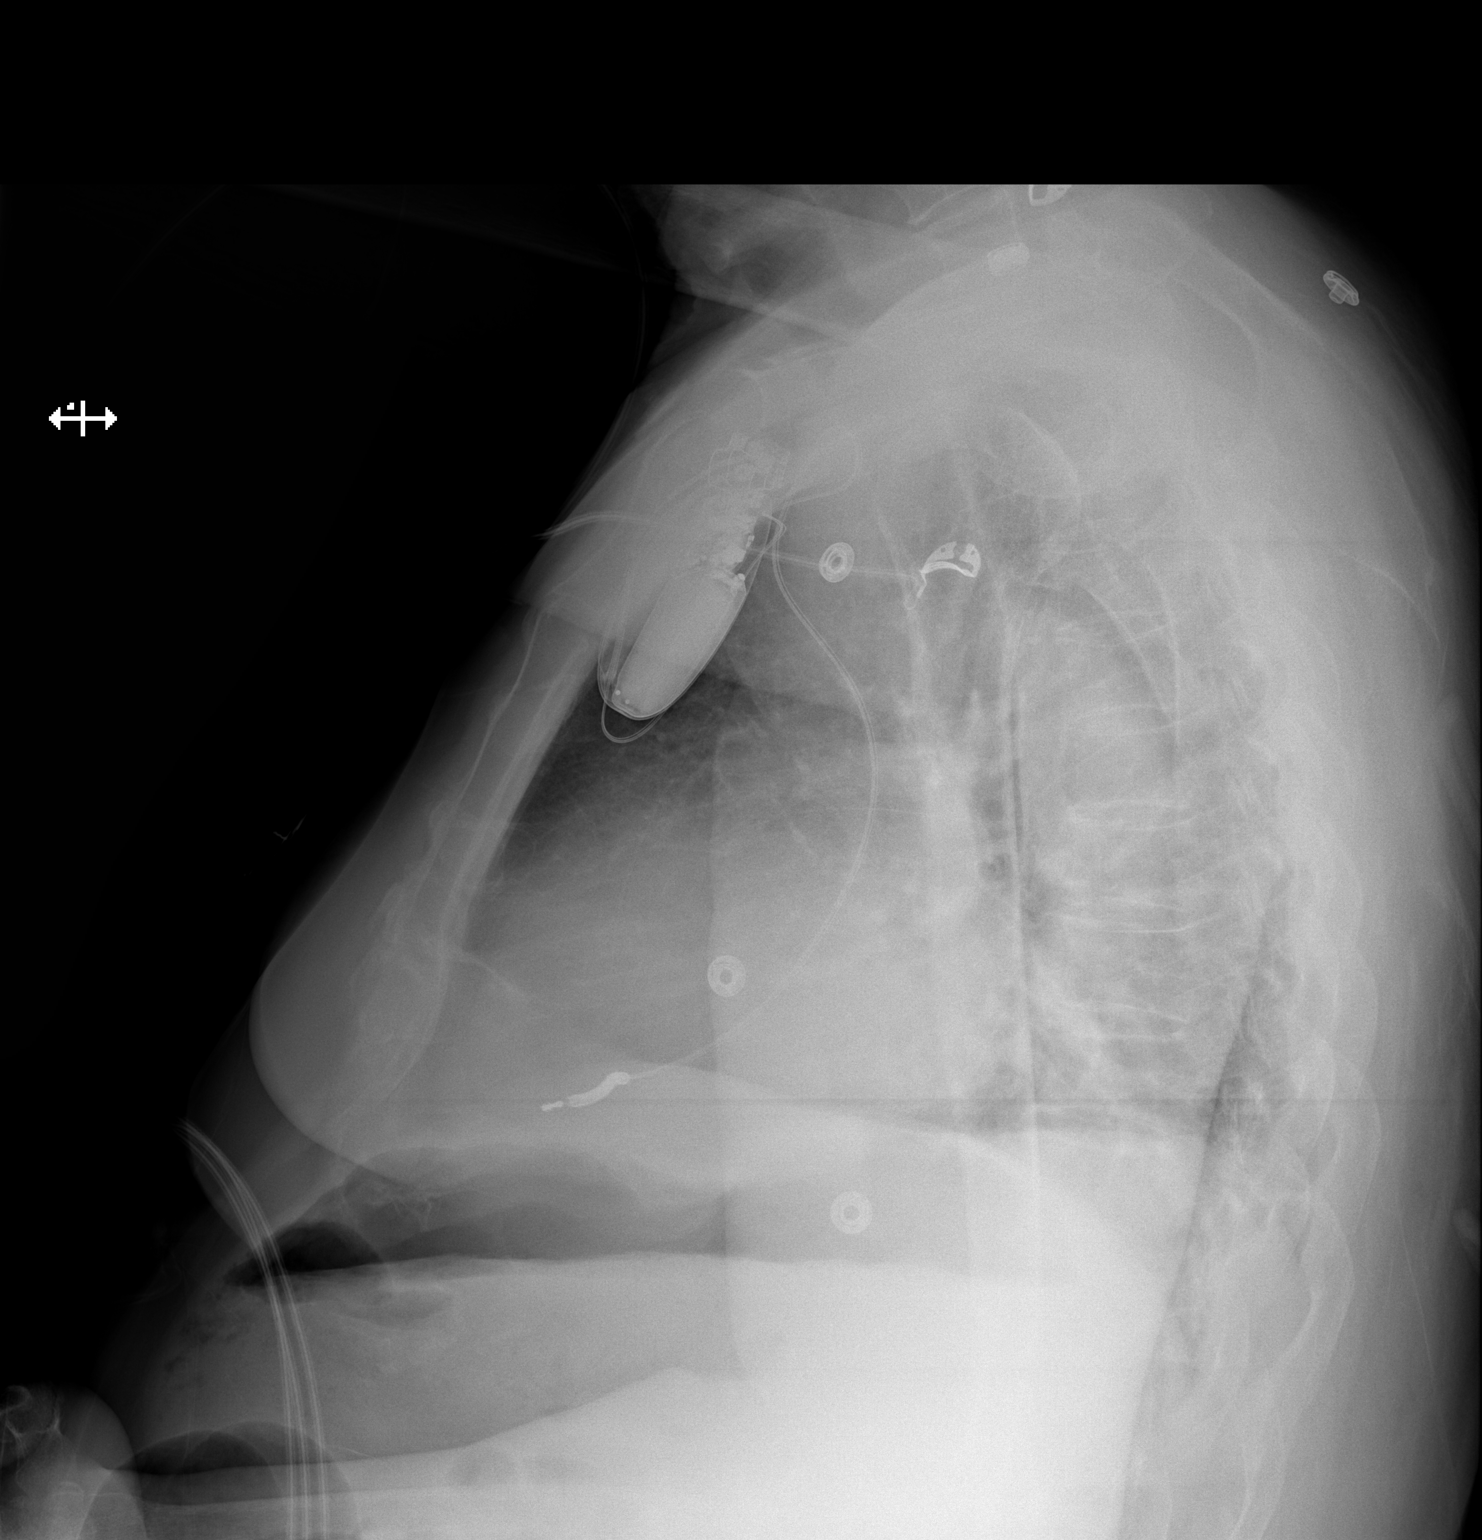

[2 of 2 positions shown; findings below may reference images not displayed]

FINDINGS: Transverse diameter of heart is increased. There are no signs of
pulmonary edema or focal pulmonary consolidation. There is no
pleural effusion or pneumothorax. Pacemaker/defibrillator battery is
seen in the left infraclavicular region with tip of lead in right
ventricle.
IMPRESSION: Placement of pacemaker/defibrillator battery in the left
infraclavicular region. Cardiomegaly. There are no signs of
pulmonary edema or focal pulmonary consolidation.

## 2022-02-22 SURGERY — ICD IMPLANT

## 2022-02-22 MED ORDER — CEFAZOLIN SODIUM-DEXTROSE 2-4 GM/100ML-% IV SOLN
2.0000 g | INTRAVENOUS | Status: AC
  Administered 2022-02-22: 2 g via INTRAVENOUS
  Filled 2022-02-22: qty 100

## 2022-02-22 MED ORDER — ACETAMINOPHEN 325 MG PO TABS: 325.0000 mg | ORAL_TABLET | ORAL | Status: DC | PRN

## 2022-02-22 MED ORDER — SODIUM CHLORIDE 0.9 % IV SOLN: INTRAVENOUS | Status: DC

## 2022-02-22 MED ORDER — POVIDONE-IODINE 10 % EX SWAB
2.0000 "application " | Freq: Once | CUTANEOUS | Status: AC
  Administered 2022-02-22: 2 via TOPICAL

## 2022-02-22 MED ORDER — SODIUM CHLORIDE 0.9 % IV SOLN
INTRAVENOUS | Status: AC
  Filled 2022-02-22: qty 2

## 2022-02-22 MED ORDER — LIDOCAINE HCL 1 % IJ SOLN
INTRAMUSCULAR | Status: AC
  Filled 2022-02-22: qty 60

## 2022-02-22 MED ORDER — CEFAZOLIN SODIUM-DEXTROSE 2-4 GM/100ML-% IV SOLN
INTRAVENOUS | Status: AC
  Filled 2022-02-22: qty 100

## 2022-02-22 MED ORDER — FENTANYL CITRATE (PF) 100 MCG/2ML IJ SOLN
INTRAMUSCULAR | Status: AC
  Filled 2022-02-22: qty 2

## 2022-02-22 MED ORDER — LIDOCAINE HCL (PF) 1 % IJ SOLN
INTRAMUSCULAR | Status: DC | PRN
  Administered 2022-02-22: 60 mL

## 2022-02-22 MED ORDER — ONDANSETRON HCL 4 MG/2ML IJ SOLN: 4.0000 mg | Freq: Four times a day (QID) | INTRAMUSCULAR | Status: DC | PRN

## 2022-02-22 MED ORDER — FENTANYL CITRATE (PF) 100 MCG/2ML IJ SOLN
INTRAMUSCULAR | Status: DC | PRN
  Administered 2022-02-22: 12.5 ug via INTRAVENOUS
  Administered 2022-02-22 (×2): 25 ug via INTRAVENOUS
  Administered 2022-02-22: 12.5 ug via INTRAVENOUS

## 2022-02-22 MED ORDER — CEFAZOLIN SODIUM-DEXTROSE 1-4 GM/50ML-% IV SOLN
1.0000 g | Freq: Once | INTRAVENOUS | Status: AC
  Administered 2022-02-22: 1 g via INTRAVENOUS
  Filled 2022-02-22 (×2): qty 50

## 2022-02-22 MED ORDER — HEPARIN (PORCINE) IN NACL 1000-0.9 UT/500ML-% IV SOLN
INTRAVENOUS | Status: AC
  Filled 2022-02-22: qty 500

## 2022-02-22 MED ORDER — CHLORHEXIDINE GLUCONATE 4 % EX LIQD
4.0000 "application " | Freq: Once | CUTANEOUS | Status: DC
  Filled 2022-02-22: qty 60

## 2022-02-22 MED ORDER — MIDAZOLAM HCL 5 MG/5ML IJ SOLN
INTRAMUSCULAR | Status: DC | PRN
  Administered 2022-02-22: 1 mg via INTRAVENOUS
  Administered 2022-02-22: 2 mg via INTRAVENOUS
  Administered 2022-02-22 (×3): 1 mg via INTRAVENOUS

## 2022-02-22 MED ORDER — MIDAZOLAM HCL 5 MG/5ML IJ SOLN
INTRAMUSCULAR | Status: AC
  Filled 2022-02-22: qty 5

## 2022-02-22 MED ORDER — SODIUM CHLORIDE 0.9 % IV SOLN
80.0000 mg | INTRAVENOUS | Status: AC
  Administered 2022-02-22: 80 mg
  Filled 2022-02-22: qty 2

## 2022-02-22 MED ORDER — MIDAZOLAM HCL 5 MG/5ML IJ SOLN
INTRAMUSCULAR | Status: AC
Start: 2022-02-22 — End: ?
  Filled 2022-02-22: qty 5

## 2022-02-22 MED ORDER — HEPARIN (PORCINE) IN NACL 1000-0.9 UT/500ML-% IV SOLN
INTRAVENOUS | Status: DC | PRN
  Administered 2022-02-22: 500 mL

## 2022-02-22 SURGICAL SUPPLY — 6 items
CABLE SURGICAL S-101-97-12 (CABLE) ×2 IMPLANT
ICD MOMENTUM D120 (ICD Generator) ×1 IMPLANT
LEAD RELIANCE 0137-59 (Lead) ×1 IMPLANT
PAD DEFIB RADIO PHYSIO CONN (PAD) ×2 IMPLANT
SHEATH 9FR PRELUDE SNAP 13 (SHEATH) ×1 IMPLANT
TRAY PACEMAKER INSERTION (PACKS) ×2 IMPLANT

## 2022-02-22 NOTE — H&P (Signed)
    HPI Bryan Crane is referred by Dr. Schumann for evaluation of HCM and to consider ICD insertion. He is a pleasant 76 yo man with HCM diagnosed almost 30 years ago. He has a h/o stroke and was placed with a ILR. 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 He has not had syncope. His RV and LV size and function are low normal. He was placed on eliquis as it was thought that the apical aneurysm was the likely cause of the clot leading to a stroke. He has not yet had any atrial fib. He has longevity in his family. The oldest was a GM who lived into her 90's. There is no family h/o sudden death at a young age. No Known Allergies   Current Outpatient Medications  Medication Sig Dispense Refill   acetaminophen (TYLENOL) 325 MG tablet Take 2 tablets (650 mg total) by mouth every 4 (four) hours as needed for mild pain (or temp > 37.5 C (99.5 F)).     apixaban (ELIQUIS) 5 MG TABS tablet Take 1 tablet (5 mg total) by mouth 2 (two) times daily. 180 tablet 3   atorvastatin (LIPITOR) 20 MG tablet Take 1 tablet (20 mg total) by mouth daily. 90 tablet 3   brimonidine (ALPHAGAN) 0.2 % ophthalmic solution Place 1 drop into both eyes 3 (three) times daily. 5 mL 12   dorzolamide-timolol (COSOPT) 22.3-6.8 MG/ML ophthalmic solution Place 1 drop into both eyes 2 (two) times daily.     latanoprost (XALATAN) 0.005 % ophthalmic solution Place 1 drop into both eyes at bedtime.  6   Multiple Vitamin (MULTIVITAMIN WITH MINERALS) TABS tablet Take 1 tablet by mouth daily.     pantoprazole (PROTONIX) 40 MG tablet Take 1 tablet (40 mg total) by mouth daily. 30 tablet 0   perindopril (ACEON) 8 MG tablet Take 2 tablets (16 mg total) by mouth daily. 180 tablet 3   Probiotic Product (PROBIOTIC DAILY PO) Take 1 capsule by mouth daily.     ranolazine (RANEXA) 1000 MG SR tablet Take 1 tablet (1,000 mg total) by mouth at bedtime. 30 tablet 0   sodium chloride (OCEAN) 0.65  % SOLN nasal spray Place 1 spray into both nostrils as needed for congestion.     tamsulosin (FLOMAX) 0.4 MG CAPS capsule Take 2 capsules (0.8 mg total) by mouth every evening. 30 capsule 0   No current facility-administered medications for this visit.     Past Medical History:  Diagnosis Date   Arthritis    Cardiomyopathy ([facility])    CKD (chronic kidney disease)    Dyspnea    with exertion   Enlarged prostate    GERD (gastroesophageal reflux disease)    History of kidney stones    Hypertension    Sleep apnea    uses cpap    ROS:   All systems reviewed and negative except as noted in the HPI.   Past Surgical History:  Procedure Laterality Date   CARDIAC CATHETERIZATION     "years ago" - states it was clear   COLONOSCOPY     KIDNEY STONE SURGERY     LOOP RECORDER INSERTION N/A 04/06/2021   Procedure: LOOP RECORDER INSERTION;  Surgeon: Amiri Riechers W, MD;  Location: [facility];  Service: Cardiovascular;  Laterality: N/A;   ORIF ELBOW FRACTURE Left 09/30/2018   Procedure: Open reduction internal fixation left elbow fracture with repair reconstruction and    allograft;  Surgeon: Gramig, William, MD;  Location: [facility];  Service: Orthopedics;  Laterality: Left;  90 mins   TONSILLECTOMY       No family history on file.   Social History   Socioeconomic History   Marital status: Married    Spouse name: Not on file   Number of children: Not on file   Years of education: Not on file   Highest education level: Not on file  Occupational History   Not on file  Tobacco Use   Smoking status: Never   Smokeless tobacco: Never  Vaping Use   Vaping Use: Never used  Substance and Sexual Activity   Alcohol use: No   Drug use: No   Sexual activity: Not on file  Other Topics Concern   Not on file  Social History Narrative   Left handed    Caffeine- occasional use   Lives at home with his wife    Social Determinants of Health   Financial Resource Strain: Not on file   Food Insecurity: Not on file  Transportation Needs: Not on file  Physical Activity: Not on file  Stress: Not on file  Social Connections: Not on file  Intimate Partner Violence: Not on file     BP (!) 144/90   Pulse 72   Ht 5' 6" (1.676 m)   Wt 212 lb (96.2 kg)   SpO2 96%   BMI 34.22 kg/m   Physical Exam:  Well appearing NAD HEENT: Unremarkable Neck:  No JVD, no thyromegally Lymphatics:  No adenopathy Back:  No CVA tenderness Lungs:  Clear with no wheezes HEART:  Regular rate rhythm, no murmurs, no rubs, no clicks Abd:  soft, positive bowel sounds, no organomegally, no rebound, no guarding Ext:  2 plus pulses, no edema, no cyanosis, no clubbing Skin:  No rashes no nodules Neuro:  CN II through XII intact, motor grossly intact  EKG - reviewed. NSR with marked first degree AV block and RBBB   Assess/Plan:  HCM - the patient has an apical aneurysm and a very thick septum and is at risk for sudden death. He has been well. He does not have much in the way of other symptoms. I have outlined the treatment options in detail. ICD insertion is an option. He will call us if he would like to proceed.  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 I might consider a DDD ICD. HTN - his bp is up a little. We will follow for now. We will encouraged a low sodium diet. 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 He will continue his current meds.  Elany Felix,MD  

## 2022-02-22 NOTE — Discharge Instructions (Signed)
? ? ?  Supplemental Discharge Instructions for  ?Pacemaker/Defibrillator Patients ? ?Tomorrow, 02/23/22, send in a device transmission ? ?Activity ?No heavy lifting or vigorous activity with your left/right arm for 6 to 8 weeks.  Do not raise your left/right arm above your head for one week.  Gradually raise your affected arm as drawn below. ? ?        ?   02/27/22                    02/28/22                    03/01/22                    03/02/22 ?__ ? ?NO DRIVING for  1 week   ; you may begin driving on   03/02/22  . ? ?WOUND CARE ?Keep the wound area clean and dry.  Do not get this area wet , no showers for one week; you may shower on  03/02/22   . ?Tomorrow, 02/23/22, remove the arm sling ?Tomorrow, 02/23/22 remove the outer plastic bandage.  Underneath the plastic bandage there are steri strips (paper tapes), DO NOT remove these. ?The tape/steri-strips on your wound will fall off; do not pull them off.  No bandage is needed on the site.  DO  NOT apply any creams, oils, or ointments to the wound area. ?If you notice any drainage or discharge from the wound, any swelling or bruising at the site, or you develop a fever > 101? F after you are discharged home, call the office at once. ? ?Special Instructions ?You are still able to use cellular telephones; use the ear opposite the side where you have your pacemaker/defibrillator.  Avoid carrying your cellular phone near your device. ?When traveling through airports, show security personnel your identification card to avoid being screened in the metal detectors.  Ask the security personnel to use the hand wand. ?Avoid arc welding equipment, MRI testing (magnetic resonance imaging), TENS units (transcutaneous nerve stimulators).  Call the office for questions about other devices. ?Avoid electrical appliances that are in poor condition or are not properly grounded. ?Microwave ovens are safe to be near or to operate. ? ?Additional information for defibrillator patients should your  device go off: ?If your device goes off ONCE and you feel fine afterward, notify the device clinic nurses. ?If your device goes off ONCE and you do not feel well afterward, call 911. ?If your device goes off TWICE, call 911. ?If your device goes off THREE times in one day, call 911. ? ?DO NOT DRIVE YOURSELF OR A FAMILY MEMBER ?WITH A DEFIBRILLATOR TO THE HOSPITAL--CALL 911. ? ?

## 2022-02-23 ENCOUNTER — Encounter (HOSPITAL_COMMUNITY): Payer: Self-pay | Admitting: Internal Medicine

## 2022-02-23 ENCOUNTER — Telehealth: Payer: Self-pay

## 2022-02-23 NOTE — Telephone Encounter (Signed)
Outreach made to Pt. ? ?Advised per Dr. Lovena Le Pt should not restart his Eliquis until February 27, 2022. ? ?Pt and wife indicate understanding. ?

## 2022-02-23 NOTE — Telephone Encounter (Signed)
Follow-up after same day discharge: ?Implant date: 02/22/2022 ?MD: Lewayne Bunting, MD ?Device: [employer] D120 MOMENTUM EL ICD ?Location: Left Chest ? ? ?Wound check visit: 03/04/2022 @ 2:40 PM ?90 day MD follow-up: 06/01/2022 @ 4:00 PM ? ?Remote Transmission received: No, Needs new monitor set up ? ?Dressing removed: Will remove this evening.   ?

## 2022-02-23 NOTE — Telephone Encounter (Signed)
-----   Message from Sheilah Pigeon, [place] sent at 02/22/2022  5:37 PM EDT ----- ?Same day d/c ? ?BSCi ? ?ICD ? ?GT ? ?

## 2022-02-24 ENCOUNTER — Encounter: Payer: Self-pay | Admitting: Internal Medicine

## 2022-02-24 NOTE — Telephone Encounter (Signed)
Monitor connected as of 02/22/2022 ?

## 2022-02-28 ENCOUNTER — Encounter: Payer: Self-pay | Admitting: Internal Medicine

## 2022-03-04 ENCOUNTER — Ambulatory Visit (INDEPENDENT_AMBULATORY_CARE_PROVIDER_SITE_OTHER): Payer: Medicare Other

## 2022-03-04 DIAGNOSIS — I422 Other hypertrophic cardiomyopathy: Secondary | ICD-10-CM | POA: Diagnosis not present

## 2022-03-04 NOTE — Patient Instructions (Addendum)
? ?  After Your ICD ?(Implantable Cardiac Defibrillator) ? ? ? ?Monitor your defibrillator site for redness, swelling, and drainage. Call the device clinic at [phone] if you experience these symptoms or fever/chills. ? ?You may wash your lower incision (where they removed the loop recorder) with warm soap and water and leave open to air to dry. Please do not shower or remove steristrips over your ICD incision site until you return for your wound check appointment on Wednesday May 10 at 2:00 pm   ? ?Do not lift, push or pull greater than 10 pounds with the affected arm until 6 weeks after your procedure. There are no other restrictions in arm movement after your wound check appointment. June 6 ? ?Your ICD is MRI compatible. ? ?Your ICD is designed to protect you from life threatening heart rhythms. Because of this, you may receive a shock.  ? ?1 shock with no symptoms:  Call the office during business hours. ?1 shock with symptoms (chest pain, chest pressure, dizziness, lightheadedness, shortness of breath, overall feeling unwell):  Call 911. ?If you experience 2 or more shocks in 24 hours:  Call 911. ?If you receive a shock, you should not drive.  ?[place] DMV - no driving for 6 months if you receive appropriate therapy from your ICD.  ? ?ICD Alerts:  Some alerts are vibratory and others beep. These are NOT emergencies. Please call our office to let us know. If this occurs at night or on weekends, it can wait until the next business day. Send a remote transmission. ? ?If your device is capable of reading fluid status (for heart failure), you will be offered monthly monitoring to review this with you.  ? ?Remote monitoring is used to monitor your ICD from home. This monitoring is scheduled every 91 days by our office. It allows [place] to keep an eye on the functioning of your device to ensure it is working properly. You will routinely see your Electrophysiologist annually (more often if necessary).  ?

## 2022-03-06 LAB — CUP PACEART INCLINIC DEVICE CHECK
Date Time Interrogation Session: 20230504000000
HighPow Impedance: 61 Ohm
HighPow Impedance: 68 Ohm
Implantable Lead Implant Date: 20230424
Implantable Lead Location: 753860
Implantable Lead Model: 137
Implantable Lead Serial Number: 301474
Implantable Pulse Generator Implant Date: 20230424
Lead Channel Impedance Value: 487 Ohm
Lead Channel Pacing Threshold Amplitude: 0.8 V
Lead Channel Pacing Threshold Pulse Width: 0.4 ms
Lead Channel Sensing Intrinsic Amplitude: 25 mV
Lead Channel Setting Pacing Amplitude: 3.5 V
Lead Channel Setting Pacing Pulse Width: 0.4 ms
Lead Channel Setting Sensing Sensitivity: 0.5 mV
Pulse Gen Serial Number: 216762

## 2022-03-06 NOTE — Progress Notes (Signed)
Wound check appointment. Steri-strips removed from ILR explant and ICD impant site. Wound without redness or edema. Distal end of incision remains open and re-steristripped today. Patient will return for wound check and removal of steristrips 03/10/22. Normal device function. Thresholds, sensing, and impedances consistent with implant measurements. Device programmed at 3.5V for extra safety margin until 3 month visit. Histogram distribution appropriate for patient and level of activity. No ventricular arrhythmias noted. Patient educated about wound care, arm mobility, lifting restrictions, shock plan. Patient enrolled in remote monitoring with next transmission scheduled 05/25/22. 91 day follow up with Dr. Ladona Ridgel 06/01/22. ?

## 2022-03-10 ENCOUNTER — Ambulatory Visit (INDEPENDENT_AMBULATORY_CARE_PROVIDER_SITE_OTHER): Payer: Medicare Other

## 2022-03-10 NOTE — Progress Notes (Signed)
Patient presents for wound recheck and removal of steri-strips. Wound well healed. No redness or swelling noted. Edges approximated. Patient advised he may now shower and wash his incision with soap and water.  ?

## 2022-03-10 NOTE — Patient Instructions (Signed)
? ? ?  Monitor your pacemaker site for redness, swelling, and drainage. Call the device clinic at [phone] if you experience these symptoms or fever/chills. ? ?Your incision was closed with Steri-strips or staples:  You may shower and wash your incision with soap and water. Avoid lotions, ointments, or perfumes over your incision until it is well-healed. ? ? ?

## 2022-04-14 ENCOUNTER — Ambulatory Visit: Payer: Medicare Other | Admitting: Cardiology

## 2022-05-25 ENCOUNTER — Ambulatory Visit (INDEPENDENT_AMBULATORY_CARE_PROVIDER_SITE_OTHER): Payer: Medicare Other

## 2022-05-25 DIAGNOSIS — I422 Other hypertrophic cardiomyopathy: Secondary | ICD-10-CM | POA: Diagnosis not present

## 2022-05-26 LAB — CUP PACEART REMOTE DEVICE CHECK
Battery Remaining Longevity: 180 mo
Battery Remaining Percentage: 100 %
Brady Statistic RV Percent Paced: 2 %
Date Time Interrogation Session: 20230725045100
HighPow Impedance: 71 Ohm
Implantable Lead Implant Date: 20230424
Implantable Lead Location: 753860
Implantable Lead Model: 137
Implantable Lead Serial Number: 301474
Implantable Pulse Generator Implant Date: 20230424
Lead Channel Impedance Value: 482 Ohm
Lead Channel Pacing Threshold Amplitude: 0.7 V
Lead Channel Pacing Threshold Pulse Width: 0.4 ms
Lead Channel Setting Pacing Amplitude: 3.5 V
Lead Channel Setting Pacing Pulse Width: 0.4 ms
Lead Channel Setting Sensing Sensitivity: 0.5 mV
Pulse Gen Serial Number: 216762

## 2022-06-01 ENCOUNTER — Ambulatory Visit (INDEPENDENT_AMBULATORY_CARE_PROVIDER_SITE_OTHER): Payer: Medicare Other | Admitting: Internal Medicine

## 2022-06-01 ENCOUNTER — Encounter: Payer: Self-pay | Admitting: Internal Medicine

## 2022-06-01 VITALS — BP 166/84 | HR 54 | Ht 65.0 in | Wt 196.0 lb

## 2022-06-01 DIAGNOSIS — I639 Cerebral infarction, unspecified: Secondary | ICD-10-CM

## 2022-06-01 DIAGNOSIS — I422 Other hypertrophic cardiomyopathy: Secondary | ICD-10-CM | POA: Diagnosis not present

## 2022-06-01 DIAGNOSIS — Z95810 Presence of automatic (implantable) cardiac defibrillator: Secondary | ICD-10-CM

## 2022-06-01 NOTE — Patient Instructions (Signed)
Medication Instructions:  Your physician recommends that you continue on your current medications as directed. Please refer to the Current Medication list given to you today.  *If you need a refill on your cardiac medications before your next appointment, please call your pharmacy*  Lab Work: None ordered.  If you have labs (blood work) drawn today and your tests are completely normal, you will receive your results only by: MyChart Message (if you have MyChart) OR A paper copy in the mail If you have any lab test that is abnormal or we need to change your treatment, we will call you to review the results.  Testing/Procedures: None ordered.  Follow-Up: At [facility], you and your health needs are our priority.  As part of our continuing mission to provide you with exceptional heart care, we have created designated Provider Care Teams.  These Care Teams include your primary Cardiologist (physician) and Advanced Practice Providers (APPs -  Physician Assistants and Nurse Practitioners) who all work together to provide you with the care you need, when you need it.  We recommend signing up for the patient portal called "MyChart".  Sign up information is provided on this After Visit Summary.  MyChart is used to connect with patients for Virtual Visits (Telemedicine).  Patients are able to view lab/test results, encounter notes, upcoming appointments, etc.  Non-urgent messages can be sent to your provider as well.   To learn more about what you can do with MyChart, go to [email or web address].    Your next appointment:   1 year(s)  The format for your next appointment:   In Person  Provider:   Lewayne Bunting, MD{or one of the following Advanced Practice Providers on your designated Care Team:   Francis Dowse, [place] Casimiro Needle "Mardelle Matte" Lanna Poche, [place] Remote monitoring is used to monitor your ICD from home. This monitoring reduces the number of office visits required to check your device to one time  per year. It allows [place] to keep an eye on the functioning of your device to ensure it is working properly. You are scheduled for a device check from home on 08/24/2022. You may send your transmission at any time that day. If you have a wireless device, the transmission will be sent automatically. After your physician reviews your transmission, you will receive a postcard with your next transmission date.  Important Information About Sugar

## 2022-06-01 NOTE — Progress Notes (Signed)
HPI Bryan Crane returns today for ongoing evaluation of HCM s/p ICD insertion. He is a pleasant 76 yo man with HCM diagnosed almost 30 years ago. He has a h/o stroke and was placed with a ILR. 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 He has not had syncope. His RV and LV size and function are low normal. He was placed on eliquis as it was thought that the apical aneurysm was the likely cause of the clot leading to a stroke. He has not yet had any atrial fib. He denies chest pain or sob. No syncope or ICD shocks.   No Known Allergies   Current Outpatient Medications  Medication Sig Dispense Refill   acetaminophen (TYLENOL) 325 MG tablet Take 2 tablets (650 mg total) by mouth every 4 (four) hours as needed for mild pain (or temp > 37.5 C (99.5 F)).     apixaban (ELIQUIS) 5 MG TABS tablet Take 1 tablet (5 mg total) by mouth 2 (two) times daily. 180 tablet 3   atorvastatin (LIPITOR) 20 MG tablet Take 1 tablet (20 mg total) by mouth daily. 90 tablet 3   brimonidine (ALPHAGAN) 0.2 % ophthalmic solution Place 1 drop into both eyes 3 (three) times daily. 5 mL 12   dorzolamide-timolol (COSOPT) 22.3-6.8 MG/ML ophthalmic solution Place 1 drop into both eyes 2 (two) times daily.     latanoprost (XALATAN) 0.005 % ophthalmic solution Place 1 drop into both eyes at bedtime.  6   Multiple Vitamin (MULTIVITAMIN WITH MINERALS) TABS tablet Take 1 tablet by mouth daily.     perindopril (ACEON) 8 MG tablet Take 2 tablets (16 mg total) by mouth daily. 180 tablet 3   Probiotic Product (PROBIOTIC DAILY PO) Take 1 capsule by mouth daily.     ranolazine (RANEXA) 1000 MG SR tablet Take 1 tablet (1,000 mg total) by mouth at bedtime. 30 tablet 0   tamsulosin (FLOMAX) 0.4 MG CAPS capsule Take 2 capsules (0.8 mg total) by mouth every evening. 30 capsule 0   No current facility-administered medications for this visit.     Past Medical History:  Diagnosis  Date   Arthritis    Cardiomyopathy (HCC)    CKD (chronic kidney disease)    Dyspnea    with exertion   Enlarged prostate    GERD (gastroesophageal reflux disease)    History of kidney stones    Hypertension    Sleep apnea    uses cpap    ROS:   All systems reviewed and negative except as noted in the HPI.   Past Surgical History:  Procedure Laterality Date   CARDIAC CATHETERIZATION     "years ago" - states it was clear   COLONOSCOPY     ICD IMPLANT N/A 02/22/2022   Procedure: ICD IMPLANT;  Surgeon: Marinus Maw, MD;  [facility]VAS[facility] CV LAB;  Service: Cardiovascular;  Laterality: N/A;   KIDNEY STONE SURGERY     LOOP RECORDER INSERTION N/A 04/06/2021   Procedure: LOOP RECORDER INSERTION;  Surgeon: Marinus Maw, MD;  Location: [facility] INVASIVE CV LAB;  Service: Cardiovascular;  Laterality: N/A;   ORIF ELBOW FRACTURE Left 09/30/2018   Procedure: Open reduction internal fixation left elbow fracture with repair reconstruction and  allograft;  Surgeon: Dominica Severin, MD;  Location: [facility] OR;  Service: Orthopedics;  Laterality: Left;  90 mins   TONSILLECTOMY       No family  history on file.   Social History   Socioeconomic History   Marital status: Married    Spouse name: Not on file   Number of children: Not on file   Years of education: Not on file   Highest education level: Not on file  Occupational History   Not on file  Tobacco Use   Smoking status: Never   Smokeless tobacco: Never  Vaping Use   Vaping Use: Never used  Substance and Sexual Activity   Alcohol use: No   Drug use: No   Sexual activity: Not on file  Other Topics Concern   Not on file  Social History Narrative   Left handed    Caffeine- occasional use   Lives at home with his wife    Social Determinants of Health   Financial Resource Strain: Not on file  Food Insecurity: Not on file  Transportation Needs: Not on file  Physical Activity: Not on file  Stress: Not on file  Social  Connections: Not on file  Intimate Partner Violence: Not on file     BP (!) 166/84   Pulse (!) 54   Ht 5\' 5"  (1.651 m)   Wt 196 lb (88.9 kg)   SpO2 97%   BMI 32.62 kg/m   Physical Exam:  Well appearing 76 yo man, NAD HEENT: Unremarkable Neck:  No JVD, no thyromegally Lymphatics:  No adenopathy Back:  No CVA tenderness Lungs:  Clear with no wheezes HEART:  Regular rate rhythm, no murmurs, no rubs, no clicks Abd:  soft, positive bowel sounds, no organomegally, no rebound, no guarding Ext:  2 plus pulses, no edema, no cyanosis, no clubbing Skin:  No rashes no nodules Neuro:  CN II through XII intact, motor grossly intact   DEVICE  Normal device function.  See PaceArt for details.   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 He has undergone ICD insertion. 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  We did not consider a DDD ICD as he would continue to pace.  HTN - his bp is up a little. We will follow for now. We will encouraged a low sodium diet. 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 He will continue his current meds. ICD - his [place] [employer] single chamber ICD is working normally.   Bryan burnie Lorain Fettes,MD

## 2022-06-24 ENCOUNTER — Other Ambulatory Visit: Payer: Self-pay | Admitting: Cardiology

## 2022-06-24 NOTE — Progress Notes (Signed)
Remote ICD transmission.   

## 2022-07-01 ENCOUNTER — Other Ambulatory Visit: Payer: Self-pay | Admitting: Nephrology

## 2022-07-01 DIAGNOSIS — N1832 Chronic kidney disease, stage 3b: Secondary | ICD-10-CM

## 2022-07-02 ENCOUNTER — Ambulatory Visit
Admission: RE | Admit: 2022-07-02 | Discharge: 2022-07-02 | Disposition: A | Discharge status: Home / Self Care | Payer: Medicare Other | Source: Ambulatory Visit | Attending: Nephrology | Admitting: Nephrology

## 2022-07-02 DIAGNOSIS — N1832 Chronic kidney disease, stage 3b: Secondary | ICD-10-CM

## 2022-08-24 ENCOUNTER — Ambulatory Visit (INDEPENDENT_AMBULATORY_CARE_PROVIDER_SITE_OTHER): Payer: Medicare Other

## 2022-08-24 DIAGNOSIS — I422 Other hypertrophic cardiomyopathy: Secondary | ICD-10-CM

## 2022-08-24 LAB — CUP PACEART REMOTE DEVICE CHECK
Battery Remaining Longevity: 180 mo
Battery Remaining Percentage: 100 %
Brady Statistic RV Percent Paced: 2 %
Date Time Interrogation Session: 20231024045100
HighPow Impedance: 73 Ohm
Implantable Lead Connection Status: 753985
Implantable Lead Implant Date: 20230424
Implantable Lead Location: 753860
Implantable Lead Model: 137
Implantable Lead Serial Number: 301474
Implantable Pulse Generator Implant Date: 20230424
Lead Channel Impedance Value: 459 Ohm
Lead Channel Pacing Threshold Amplitude: 0.7 V
Lead Channel Pacing Threshold Pulse Width: 0.4 ms
Lead Channel Setting Pacing Amplitude: 2.5 V
Lead Channel Setting Pacing Pulse Width: 0.4 ms
Lead Channel Setting Sensing Sensitivity: 0.5 mV
Pulse Gen Serial Number: 216762
Zone Setting Status: 755011

## 2022-09-01 NOTE — Progress Notes (Unsigned)
Cardiology Office Note:    Date:  09/02/2022   ID:  Bryan Crane, DOB 16-Nov-1945, MRN [id number]  PCP:  Majel Homer, MD  Cardiologist:  Little Ishikawa, MD  Electrophysiologist:  None   Referring MD: Majel Homer, MD   Chief Complaint  Patient presents with   Follow-up   Cardiomyopathy    History of Present Illness:    Bryan Crane is a 76 y.o. male with a hx of hypertrophic cardiomyopathy, CKD stage III, OSA, hypertension, CVA who presents for follow-up.  He was referred by Dr. Janeece Fitting for evaluation of hypertrophic cardiomyopathy, he was admitted 04/03/2021 with acute CVA.  Echocardiogram showed EF 60 to 65%, normal diastolic function, normal RV function, no significant valvular disease.  Loop recorder was placed for work-up of cryptogenic stroke.    Reports he was diagnosed with hypertrophic cardiomyopathy at age 39.  Has followed with Dr.Gooray in [place], [place] since that time.  He now lives in [place].  He denies any chest pain or lower extremity edema.  Does report occasional dyspnea.  States that before his stroke though he was exercising by walking or riding bike for 1 hour daily, denies any exertional symptoms.  Reports intermittent lightheadedness, denies any syncope.  Does report episodes of palpitations where he feels like heart is racing since his stroke.  No smoking history.  He thinks his maternal grandfather had HCM and had PPM placed.  Brother has PPM.  Cardiac MRI on 07/28/2021 showed asymmetric LV hypertrophy measuring up to 24 mm in mid septum, consistent with hypertrophic cardiomyopathy reverse septal curvature subtype.  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  He was started on Eliquis given LV apical aneurysm and recent cryptogenic stroke.  Underwent ICD placement 02/22/2022  Since last clinic visit, he  reports he is doing well.  Denies any chest pain, dyspnea, lightheadedness, syncope, lower extremity edema.  Reports occasional feeling like heart is racing.  He is taking Eliquis, denies any bleeding issues.  He is walking 1.5 miles 3 to 4 days/week.   BP Readings from Last 3 Encounters:  09/02/22 (!) 144/74  06/01/22 (!) 166/84  02/22/22 (!) 166/75     Past Medical History:  Diagnosis Date   Arthritis    Cardiomyopathy (HCC)    CKD (chronic kidney disease)    Dyspnea    with exertion   Enlarged prostate    GERD (gastroesophageal reflux disease)    History of kidney stones    Hypertension    Sleep apnea    uses cpap    Past Surgical History:  Procedure Laterality Date   CARDIAC CATHETERIZATION     "years ago" - states it was clear   COLONOSCOPY     ICD IMPLANT N/A 02/22/2022   Procedure: ICD IMPLANT;  Surgeon: Marinus Maw, MD;  Location: [facility] INVASIVE CV LAB;  Service: Cardiovascular;  Laterality: N/A;   KIDNEY STONE SURGERY     LOOP RECORDER INSERTION N/A 04/06/2021   Procedure: LOOP RECORDER INSERTION;  Surgeon: Marinus Maw, MD;  Location: [facility] INVASIVE CV LAB;  Service: Cardiovascular;  Laterality: N/A;   ORIF ELBOW FRACTURE Left 09/30/2018   Procedure: Open reduction internal fixation left elbow fracture with repair reconstruction and  allograft;  Surgeon: Dominica Severin, MD;  Location: [facility] OR;  Service: Orthopedics;  Laterality: Left;  90 mins   TONSILLECTOMY  Current Medications: Current Meds  Medication Sig   acetaminophen (TYLENOL) 325 MG tablet Take 2 tablets (650 mg total) by mouth every 4 (four) hours as needed for mild pain (or temp > 37.5 C (99.5 F)).   apixaban (ELIQUIS) 5 MG TABS tablet Take 1 tablet (5 mg total) by mouth 2 (two) times daily.   atorvastatin (LIPITOR) 20 MG tablet TAKE 1 TABLET BY MOUTH EVERY DAY   brimonidine (ALPHAGAN) 0.2 % ophthalmic solution Place 1 drop into both eyes 3 (three) times daily.   dorzolamide-timolol (COSOPT)  22.3-6.8 MG/ML ophthalmic solution Place 1 drop into both eyes 2 (two) times daily.   FARXIGA 10 MG TABS tablet Take 10 mg by mouth daily.   latanoprost (XALATAN) 0.005 % ophthalmic solution Place 1 drop into both eyes at bedtime.   Multiple Vitamin (MULTIVITAMIN WITH MINERALS) TABS tablet Take 1 tablet by mouth daily.   perindopril (ACEON) 8 MG tablet Take 2 tablets (16 mg total) by mouth daily.   Probiotic Product (PROBIOTIC DAILY PO) Take 1 capsule by mouth daily.   ranolazine (RANEXA) 1000 MG SR tablet Take 1 tablet (1,000 mg total) by mouth at bedtime.   tamsulosin (FLOMAX) 0.4 MG CAPS capsule Take 2 capsules (0.8 mg total) by mouth every evening.     Allergies:   Patient has no known allergies.   Social History   Socioeconomic History   Marital status: Married    Spouse name: Not on file   Number of children: Not on file   Years of education: Not on file   Highest education level: Not on file  Occupational History   Not on file  Tobacco Use   Smoking status: Never   Smokeless tobacco: Never  Vaping Use   Vaping Use: Never used  Substance and Sexual Activity   Alcohol use: No   Drug use: No   Sexual activity: Not on file  Other Topics Concern   Not on file  Social History Narrative   Left handed    Caffeine- occasional use   Lives at home with his wife    Social Determinants of Health   Financial Resource Strain: Not on file  Food Insecurity: Not on file  Transportation Needs: Not on file  Physical Activity: Not on file  Stress: Not on file  Social Connections: Not on file     Family History: He thinks his maternal grandfather had HCM and had PPM placed.  Brother has PPM.  ROS:   Please see the history of present illness.     All other systems reviewed and are negative.  EKGs/Labs/Other Studies Reviewed:    The following studies were reviewed today:   EKG:   02/03/22: Sinus rhythm first-degree AV block, right bundle branch block, Q waves in I, aVL, V  4-6  Recent Labs: 02/03/2022: BUN 14; Creatinine, Ser 1.61; Hemoglobin 14.6; Platelets 230; Potassium 4.1; Sodium 146  Recent Lipid Panel    Component Value Date/Time   CHOL 81 (L) 06/16/2021 1140   TRIG 63 06/16/2021 1140   HDL 38 (L) 06/16/2021 1140   CHOLHDL 2.1 06/16/2021 1140   CHOLHDL 4.7 04/04/2021 0649   VLDL 33 04/04/2021 0649   LDLCALC 28 06/16/2021 1140    Physical Exam:    VS:  BP (!) 144/74 (BP Location: Left Arm, Patient Position: Sitting)   Pulse (!) 55   Wt 197 lb (89.4 kg)   SpO2 95%   BMI 32.78 kg/m     Wt Readings from  Last 3 Encounters:  09/02/22 197 lb (89.4 kg)  06/01/22 196 lb (88.9 kg)  02/22/22 192 lb (87.1 kg)     GEN:  Well nourished, well developed in no acute distress HEENT: Normal NECK: No JVD; No carotid bruits CARDIAC: RRR, no murmurs, rubs, gallops RESPIRATORY:  Clear to auscultation without rales, wheezing or rhonchi  ABDOMEN: Soft, non-tender, non-distended MUSCULOSKELETAL:  No edema; No deformity  SKIN: Warm and dry NEUROLOGIC:  Alert and oriented x 3 PSYCHIATRIC:  Normal affect   ASSESSMENT:    1. Hypertrophic cardiomyopathy (HCC)   2. Cryptogenic stroke (HCC)   3. Essential hypertension   4. Hyperlipidemia, unspecified hyperlipidemia type   5. Stage 3b chronic kidney disease (HCC)     PLAN:    Hypertrophic cardiomyopathy: Diagnosed age 68, followed with cardiologist in [place].  Cardiac MRI on 07/28/2021 showed asymmetric LV hypertrophy measuring up to 24 mm in mid septum, consistent with hypertrophic cardiomyopathy reverse septal curvature subtype.  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  Underwent ICD placement 02/22/2022 -While no LV thrombus seen on MRI, he is at high risk for thrombus given LV apical aneurysm.  Given cryptogenic stroke 04/2021, concerning that the apical aneurysm is the source of his stroke and  started Eliquis 5 mg twice daily.  Continue Eliquis -Follows with EP for ICD management  CVA: Diagnosed June 2022.  Loop recorder placed.  Given LV apical aneurysm, concern for LV thrombus as source.  Started Eliquis as above.  Continue atorvastatin  Hypertension: on perindopril 16 mg daily.  BP mildly elevated in clinic today, recommend checking BP twice daily for next week and calling with results  Hyperlipidemia: Continue atorvastatin 20 mg daily.  LDL 28 on 06/16/2021  CKD stage 3B: Creatinine 1.6 on 02/03/22  RTC in 6 months   Medication Adjustments/Labs and Tests Ordered: Current medicines are reviewed at length with the patient today.  Concerns regarding medicines are outlined above.  No orders of the defined types were placed in this encounter.    No orders of the defined types were placed in this encounter.    Patient Instructions  Medication Instructions:  Your physician recommends that you continue on your current medications as directed. Please refer to the Current Medication list given to you today.  *If you need a refill on your cardiac medications before your next appointment, please call your pharmacy*  Follow-Up: At [facility], you and your health needs are our priority.  As part of our continuing mission to provide you with exceptional heart care, we have created designated Provider Care Teams.  These Care Teams include your primary Cardiologist (physician) and Advanced Practice Providers (APPs -  Physician Assistants and Nurse Practitioners) who all work together to provide you with the care you need, when you need it.  We recommend signing up for the patient portal called "MyChart".  Sign up information is provided on this After Visit Summary.  MyChart is used to connect with patients for Virtual Visits (Telemedicine).  Patients are able to view lab/test results, encounter notes, upcoming appointments, etc.  Non-urgent messages can be sent to your provider  as well.   To learn more about what you can do with MyChart, go to [email or web address].    Your next appointment:   6 month(s)  The format for your next appointment:   In Person  Provider:  Donato Heinz, MD     Other Instructions Please check your blood pressure at home twice daily, write it down.  Call the office or send message via Mychart with the readings in 1 week for Dr. Gardiner Rhyme to review.         Signed, Donato Heinz, MD  09/02/2022 6:06 PM    [place]

## 2022-09-02 ENCOUNTER — Encounter: Payer: Self-pay | Admitting: Cardiology

## 2022-09-02 ENCOUNTER — Ambulatory Visit: Payer: Medicare Other | Attending: Cardiology | Admitting: Cardiology

## 2022-09-02 VITALS — BP 144/74 | HR 55 | Wt 197.0 lb

## 2022-09-02 DIAGNOSIS — I10 Essential (primary) hypertension: Secondary | ICD-10-CM | POA: Insufficient documentation

## 2022-09-02 DIAGNOSIS — E785 Hyperlipidemia, unspecified: Secondary | ICD-10-CM | POA: Insufficient documentation

## 2022-09-02 DIAGNOSIS — N1832 Chronic kidney disease, stage 3b: Secondary | ICD-10-CM | POA: Diagnosis present

## 2022-09-02 DIAGNOSIS — I422 Other hypertrophic cardiomyopathy: Secondary | ICD-10-CM | POA: Insufficient documentation

## 2022-09-02 DIAGNOSIS — I639 Cerebral infarction, unspecified: Secondary | ICD-10-CM | POA: Insufficient documentation

## 2022-09-02 NOTE — Patient Instructions (Signed)
Medication Instructions:  Your physician recommends that you continue on your current medications as directed. Please refer to the Current Medication list given to you today.  *If you need a refill on your cardiac medications before your next appointment, please call your pharmacy*  Follow-Up: At [facility], you and your health needs are our priority.  As part of our continuing mission to provide you with exceptional heart care, we have created designated Provider Care Teams.  These Care Teams include your primary Cardiologist (physician) and Advanced Practice Providers (APPs -  Physician Assistants and Nurse Practitioners) who all work together to provide you with the care you need, when you need it.  We recommend signing up for the patient portal called "MyChart".  Sign up information is provided on this After Visit Summary.  MyChart is used to connect with patients for Virtual Visits (Telemedicine).  Patients are able to view lab/test results, encounter notes, upcoming appointments, etc.  Non-urgent messages can be sent to your provider as well.   To learn more about what you can do with MyChart, go to [email or web address].    Your next appointment:   6 month(s)  The format for your next appointment:   In Person  Provider:   Donato Heinz, MD     Other Instructions Please check your blood pressure at home twice daily, write it down.  Call the office or send message via Mychart with the readings in 1 week for Dr. Gardiner Rhyme to review.

## 2022-09-13 NOTE — Progress Notes (Signed)
Remote ICD transmission.   

## 2022-11-23 ENCOUNTER — Ambulatory Visit: Payer: Medicare Other | Attending: Internal Medicine

## 2022-11-23 DIAGNOSIS — I422 Other hypertrophic cardiomyopathy: Secondary | ICD-10-CM | POA: Diagnosis not present

## 2022-11-23 LAB — CUP PACEART REMOTE DEVICE CHECK
Battery Remaining Longevity: 180 mo
Battery Remaining Percentage: 100 %
Brady Statistic RV Percent Paced: 1 %
Date Time Interrogation Session: 20240123045200
HighPow Impedance: 81 Ohm
Implantable Lead Connection Status: 753985
Implantable Lead Implant Date: 20230424
Implantable Lead Location: 753860
Implantable Lead Model: 137
Implantable Lead Serial Number: 301474
Implantable Pulse Generator Implant Date: 20230424
Lead Channel Impedance Value: 454 Ohm
Lead Channel Pacing Threshold Amplitude: 0.7 V
Lead Channel Pacing Threshold Pulse Width: 0.4 ms
Lead Channel Setting Pacing Amplitude: 2.5 V
Lead Channel Setting Pacing Pulse Width: 0.4 ms
Lead Channel Setting Sensing Sensitivity: 0.5 mV
Pulse Gen Serial Number: 216762
Zone Setting Status: 755011

## 2022-12-21 NOTE — Progress Notes (Signed)
Remote ICD transmission.   

## 2022-12-23 ENCOUNTER — Telehealth: Payer: Self-pay | Admitting: Cardiology

## 2022-12-23 MED ORDER — APIXABAN 5 MG PO TABS: 5.0000 mg | ORAL_TABLET | Freq: Two times a day (BID) | ORAL | 0 refills | Status: DC

## 2022-12-23 NOTE — Addendum Note (Signed)
Addended by: Anda Latina on: 12/23/2022 01:29 PM   Modules accepted: Orders

## 2022-12-23 NOTE — Telephone Encounter (Signed)
Pt c/o medication issue:  1. Name of Medication:   apixaban (ELIQUIS) 5 MG TABS tablet    2. How are you currently taking this medication (dosage and times per day)?    3. Are you having a reaction (difficulty breathing--STAT)? no  4. What is your medication issue? Patient calling in to see if he can get 7 days worth of samples, until he get his prescription. Please advise

## 2022-12-23 NOTE — Telephone Encounter (Signed)
Eliquis 52m twice daily approved by PharmD. Lot #:[id number] Exp: 9/25. Pt informed and will pick up med.

## 2023-02-22 ENCOUNTER — Ambulatory Visit (INDEPENDENT_AMBULATORY_CARE_PROVIDER_SITE_OTHER): Payer: Medicare Other

## 2023-02-22 DIAGNOSIS — I422 Other hypertrophic cardiomyopathy: Secondary | ICD-10-CM

## 2023-02-23 LAB — CUP PACEART REMOTE DEVICE CHECK
Battery Remaining Longevity: 174 mo
Battery Remaining Percentage: 100 %
Brady Statistic RV Percent Paced: 1 %
Date Time Interrogation Session: 20240423045100
HighPow Impedance: 76 Ohm
Implantable Lead Connection Status: 753985
Implantable Lead Implant Date: 20230424
Implantable Lead Location: 753860
Implantable Lead Model: 137
Implantable Lead Serial Number: 301474
Implantable Pulse Generator Implant Date: 20230424
Lead Channel Impedance Value: 457 Ohm
Lead Channel Pacing Threshold Amplitude: 0.7 V
Lead Channel Pacing Threshold Pulse Width: 0.4 ms
Lead Channel Setting Pacing Amplitude: 2.5 V
Lead Channel Setting Pacing Pulse Width: 0.4 ms
Lead Channel Setting Sensing Sensitivity: 0.5 mV
Pulse Gen Serial Number: 216762
Zone Setting Status: 755011

## 2023-03-03 ENCOUNTER — Ambulatory Visit: Payer: Medicare Other | Attending: Cardiology | Admitting: Cardiology

## 2023-03-03 ENCOUNTER — Encounter: Payer: Self-pay | Admitting: Cardiology

## 2023-03-03 VITALS — BP 144/70 | HR 66 | Ht 60.0 in | Wt 201.2 lb

## 2023-03-03 DIAGNOSIS — I639 Cerebral infarction, unspecified: Secondary | ICD-10-CM | POA: Insufficient documentation

## 2023-03-03 DIAGNOSIS — N1832 Chronic kidney disease, stage 3b: Secondary | ICD-10-CM | POA: Insufficient documentation

## 2023-03-03 DIAGNOSIS — I10 Essential (primary) hypertension: Secondary | ICD-10-CM

## 2023-03-03 DIAGNOSIS — I422 Other hypertrophic cardiomyopathy: Secondary | ICD-10-CM | POA: Insufficient documentation

## 2023-03-03 NOTE — Progress Notes (Addendum)
Cardiology Office Note:    Date:  03/03/2023   ID:  Bryan Crane, DOB 1946/06/09, MRN [id number]  PCP:  Majel Homer, MD  Cardiologist:  Little Ishikawa, MD  Electrophysiologist:  None   Referring MD: Majel Homer, MD   Chief Complaint  Patient presents with   Cardiomyopathy    History of Present Illness:    Bryan Crane is a 77 y.o. male with a hx of hypertrophic cardiomyopathy, CKD stage III, OSA, hypertension, CVA who presents for follow-up.  He was referred by Dr. Janeece Fitting for evaluation of hypertrophic cardiomyopathy, he was admitted 04/03/2021 with acute CVA.  Echocardiogram showed EF 60 to 65%, normal diastolic function, normal RV function, no significant valvular disease.  Loop recorder was placed for work-up of cryptogenic stroke.    Reports he was diagnosed with hypertrophic cardiomyopathy at age 33.  Has followed with Dr.Gooray in [place], [place] since that time.  He now lives in [place].  He denies any chest pain or lower extremity edema.  Does report occasional dyspnea.  States that before his stroke though he was exercising by walking or riding bike for 1 hour daily, denies any exertional symptoms.  Reports intermittent lightheadedness, denies any syncope.  Does report episodes of palpitations where he feels like heart is racing since his stroke.  No smoking history.  He thinks his maternal grandfather had HCM and had PPM placed.  Brother has PPM.  Cardiac MRI on 07/28/2021 showed asymmetric LV hypertrophy measuring up to 24 mm in mid septum, consistent with hypertrophic cardiomyopathy reverse septal curvature subtype.  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  He was started on Eliquis given LV apical aneurysm and recent cryptogenic stroke.  Underwent ICD placement 02/22/2022  Since last clinic visit, he reports he is doing  okay.  He is doing physical therapy for some balance issues.  Denies any chest pain, dyspnea, lightheadedness, syncope, lower extremity edema, or palpitations.  Denies any bleeding on Eliquis.  Rides bike 5 times per week for about 30 minutes.   Past Medical History:  Diagnosis Date   Arthritis    Cardiomyopathy (HCC)    CKD (chronic kidney disease)    Dyspnea    with exertion   Enlarged prostate    GERD (gastroesophageal reflux disease)    History of kidney stones    Hypertension    Sleep apnea    uses cpap    Past Surgical History:  Procedure Laterality Date   CARDIAC CATHETERIZATION     "years ago" - states it was clear   COLONOSCOPY     ICD IMPLANT N/A 02/22/2022   Procedure: ICD IMPLANT;  Surgeon: Marinus Maw, MD;  [facility]: [facility] INVASIVE CV LAB;  Service: Cardiovascular;  Laterality: N/A;   KIDNEY STONE SURGERY     LOOP RECORDER INSERTION N/A 04/06/2021   Procedure: LOOP RECORDER INSERTION;  Surgeon: Marinus Maw, MD;  Location: [facility] INVASIVE CV LAB;  Service: Cardiovascular;  Laterality: N/A;   ORIF ELBOW FRACTURE Left 09/30/2018   Procedure: Open reduction internal fixation left elbow fracture with repair reconstruction and  allograft;  Surgeon: Dominica Severin, MD;  Location: [facility] OR;  Service: Orthopedics;  Laterality: Left;  90 mins   TONSILLECTOMY      Current Medications: Current Meds  Medication Sig   acetaminophen (TYLENOL) 325 MG tablet Take 325 mg by mouth  as needed for moderate pain.   apixaban (ELIQUIS) 5 MG TABS tablet Take 1 tablet (5 mg total) by mouth 2 (two) times daily.   atorvastatin (LIPITOR) 20 MG tablet TAKE 1 TABLET BY MOUTH EVERY DAY   dorzolamide-timolol (COSOPT) 22.3-6.8 MG/ML ophthalmic solution Place 1 drop into both eyes 2 (two) times daily.   empagliflozin (JARDIANCE) 10 MG TABS tablet Take 25 mg by mouth daily.   latanoprost (XALATAN) 0.005 % ophthalmic solution Place 1 drop into both eyes at bedtime.   Multiple Vitamin (MULTIVITAMIN WITH  MINERALS) TABS tablet Take 1 tablet by mouth daily.   perindopril (ACEON) 8 MG tablet Take 2 tablets (16 mg total) by mouth daily.   Probiotic Product (PROBIOTIC DAILY PO) Take 1 capsule by mouth daily.   ranolazine (RANEXA) 1000 MG SR tablet Take 1 tablet (1,000 mg total) by mouth at bedtime.   tamsulosin (FLOMAX) 0.4 MG CAPS capsule Take 2 capsules (0.8 mg total) by mouth every evening.     Allergies:   Patient has no known allergies.   Social History   Socioeconomic History   Marital status: Married    Spouse name: Not on file   Number of children: Not on file   Years of education: Not on file   Highest education level: Not on file  Occupational History   Not on file  Tobacco Use   Smoking status: Never   Smokeless tobacco: Never  Vaping Use   Vaping Use: Never used  Substance and Sexual Activity   Alcohol use: No   Drug use: No   Sexual activity: Not on file  Other Topics Concern   Not on file  Social History Narrative   Left handed    Caffeine- occasional use   Lives at home with his wife    Social Determinants of Health   Financial Resource Strain: Not on file  Food Insecurity: Not on file  Transportation Needs: Not on file  Physical Activity: Not on file  Stress: Not on file  Social Connections: Not on file     Family History: He thinks his maternal grandfather had HCM and had PPM placed.  Brother has PPM.  ROS:   Please see the history of present illness.     All other systems reviewed and are negative.  EKGs/Labs/Other Studies Reviewed:    The following studies were reviewed today:   EKG:   02/03/22: Sinus rhythm first-degree AV block, right bundle branch block, Q waves in I, aVL, V4-6 03/03/23: Sinus rhythm with first-degree AV block, right bundle branch block, Q waves in leads I, aVL, V4-6  Recent Labs: No results found for requested labs within last 365 days.  Recent Lipid Panel    Component Value Date/Time   CHOL 81 (L) 06/16/2021 1140    TRIG 63 06/16/2021 1140   HDL 38 (L) 06/16/2021 1140   CHOLHDL 2.1 06/16/2021 1140   CHOLHDL 4.7 04/04/2021 0649   VLDL 33 04/04/2021 0649   LDLCALC 28 06/16/2021 1140    Physical Exam:    VS:  BP (!) 144/70 (BP Location: Left Arm, Patient Position: Sitting, Cuff Size: Normal)   Pulse 66   Ht 5' (1.524 m)   Wt 201 lb 3.2 oz (91.3 kg)   SpO2 98%   BMI 39.29 kg/m     Wt Readings from Last 3 Encounters:  03/03/23 201 lb 3.2 oz (91.3 kg)  09/02/22 197 lb (89.4 kg)  06/01/22 196 lb (88.9 kg)  GEN:  Well nourished, well developed in no acute distress HEENT: Normal NECK: No JVD; No carotid bruits CARDIAC: RRR, no murmurs, rubs, gallops RESPIRATORY:  Clear to auscultation without rales, wheezing or rhonchi  ABDOMEN: Soft, non-tender, non-distended MUSCULOSKELETAL:  No edema; No deformity  SKIN: Warm and dry NEUROLOGIC:  Alert and oriented x 3 PSYCHIATRIC:  Normal affect   ASSESSMENT:    1. Hypertrophic cardiomyopathy (HCC)   2. Cryptogenic stroke (HCC)   3. Essential hypertension   4. Stage 3b chronic kidney disease (HCC)      PLAN:    Hypertrophic cardiomyopathy: Diagnosed age 23, followed with cardiologist in [place].  Cardiac MRI on 07/28/2021 showed asymmetric LV hypertrophy measuring up to 24 mm in mid septum, consistent with hypertrophic cardiomyopathy reverse septal curvature subtype.  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  Underwent ICD placement 02/22/2022 -While no LV thrombus seen on MRI, he is at high risk for thrombus given LV apical aneurysm.  Given cryptogenic stroke 04/2021, concerning that the apical aneurysm is the source of his stroke and started Eliquis 5 mg twice daily.  Continue Eliquis -Follows with EP for ICD management -Update echocardiogram  CVA: Diagnosed June 2022.  Loop recorder placed.  Given LV apical aneurysm, concern for LV  thrombus as source.  Started Eliquis as above.  Continue atorvastatin  Hypertension: on perindopril 16 mg daily.  BP mildly elevated in clinic today, recommend checking BP twice daily for next week and calling with results  Hyperlipidemia: Continue atorvastatin 20 mg daily.  LDL 28 on 06/16/2021  CKD stage 3B: Creatinine 1.78 on 01/19/23   RTC in 6 months   Medication Adjustments/Labs and Tests Ordered: Current medicines are reviewed at length with the patient today.  Concerns regarding medicines are outlined above.  Orders Placed This Encounter  Procedures   EKG 12-Lead   ECHOCARDIOGRAM COMPLETE     No orders of the defined types were placed in this encounter.    Patient Instructions  Medication Instructions:  Your physician recommends that you continue on your current medications as directed. Please refer to the Current Medication list given to you today.  *If you need a refill on your cardiac medications before your next appointment, please call your pharmacy*  Testing/Procedures: Your physician has requested that you have an echocardiogram. Echocardiography is a painless test that uses sound waves to create images of your heart. It provides your doctor with information about the size and shape of your heart and how well your heart's chambers and valves are working. This procedure takes approximately one hour. There are no restrictions for this procedure. Please do NOT wear cologne, perfume, aftershave, or lotions (deodorant is allowed). Please arrive 15 minutes prior to your appointment time.  Follow-Up: At [facility], you and your health needs are our priority.  As part of our continuing mission to provide you with exceptional heart care, we have created designated Provider Care Teams.  These Care Teams include your primary Cardiologist (physician) and Advanced Practice Providers (APPs -  Physician Assistants and Nurse Practitioners) who all work together to provide  you with the care you need, when you need it.  We recommend signing up for the patient portal called "MyChart".  Sign up information is provided on this After Visit Summary.  MyChart is used to connect with patients for Virtual Visits (Telemedicine).  Patients are able  to view lab/test results, encounter notes, upcoming appointments, etc.  Non-urgent messages can be sent to your provider as well.   To learn more about what you can do with MyChart, go to [email or web address].    Your next appointment:   6 month(s)  Provider:   Little Ishikawa, MD        Signed, Little Ishikawa, MD  03/03/2023 4:08 PM    [facility]

## 2023-03-03 NOTE — Patient Instructions (Signed)
Medication Instructions:  Your physician recommends that you continue on your current medications as directed. Please refer to the Current Medication list given to you today.  *If you need a refill on your cardiac medications before your next appointment, please call your pharmacy*  Testing/Procedures: Your physician has requested that you have an echocardiogram. Echocardiography is a painless test that uses sound waves to create images of your heart. It provides your doctor with information about the size and shape of your heart and how well your heart's chambers and valves are working. This procedure takes approximately one hour. There are no restrictions for this procedure. Please do NOT wear cologne, perfume, aftershave, or lotions (deodorant is allowed). Please arrive 15 minutes prior to your appointment time.  Follow-Up: At [facility], you and your health needs are our priority.  As part of our continuing mission to provide you with exceptional heart care, we have created designated Provider Care Teams.  These Care Teams include your primary Cardiologist (physician) and Advanced Practice Providers (APPs -  Physician Assistants and Nurse Practitioners) who all work together to provide you with the care you need, when you need it.  We recommend signing up for the patient portal called "MyChart".  Sign up information is provided on this After Visit Summary.  MyChart is used to connect with patients for Virtual Visits (Telemedicine).  Patients are able to view lab/test results, encounter notes, upcoming appointments, etc.  Non-urgent messages can be sent to your provider as well.   To learn more about what you can do with MyChart, go to [email or web address].    Your next appointment:   6 month(s)  Provider:   Christopher L Schumann, MD       

## 2023-03-23 NOTE — Progress Notes (Signed)
Remote ICD transmission.   

## 2023-04-01 ENCOUNTER — Ambulatory Visit (HOSPITAL_COMMUNITY): Payer: Medicare Other | Attending: Cardiology

## 2023-04-01 DIAGNOSIS — I422 Other hypertrophic cardiomyopathy: Secondary | ICD-10-CM | POA: Diagnosis present

## 2023-04-01 LAB — ECHOCARDIOGRAM COMPLETE
Area-P 1/2: 3.99 cm2
S' Lateral: 2.85 cm

## 2023-05-12 LAB — LAB REPORT - SCANNED
Albumin, Urine POC: 39.1
Albumin/Creatinine Ratio, Urine, POC: 51
Creatinine, POC: 76.4 mg/dL
EGFR: 30

## 2023-05-24 ENCOUNTER — Ambulatory Visit (INDEPENDENT_AMBULATORY_CARE_PROVIDER_SITE_OTHER): Payer: Medicare Other

## 2023-05-24 DIAGNOSIS — I422 Other hypertrophic cardiomyopathy: Secondary | ICD-10-CM

## 2023-05-26 LAB — CUP PACEART REMOTE DEVICE CHECK
Battery Remaining Percentage: 100 %
Brady Statistic RV Percent Paced: 1 %
Date Time Interrogation Session: 20240723045100
HighPow Impedance: 77 Ohm
Implantable Lead Connection Status: 753985
Implantable Lead Implant Date: 20230424
Implantable Lead Location: 753860
Implantable Lead Model: 137
Implantable Pulse Generator Implant Date: 20230424
Lead Channel Impedance Value: 454 Ohm
Lead Channel Pacing Threshold Amplitude: 0.7 V
Lead Channel Pacing Threshold Pulse Width: 0.4 ms
Lead Channel Setting Pacing Amplitude: 2.5 V
Lead Channel Setting Pacing Pulse Width: 0.4 ms
Lead Channel Setting Sensing Sensitivity: 0.5 mV
Zone Setting Status: 755011

## 2023-06-09 NOTE — Progress Notes (Signed)
Remote ICD transmission.   

## 2023-06-19 ENCOUNTER — Other Ambulatory Visit: Payer: Self-pay | Admitting: Cardiology

## 2023-07-05 ENCOUNTER — Encounter: Payer: Self-pay | Admitting: Cardiology

## 2023-08-23 ENCOUNTER — Ambulatory Visit (INDEPENDENT_AMBULATORY_CARE_PROVIDER_SITE_OTHER): Payer: Medicare Other

## 2023-08-23 DIAGNOSIS — I422 Other hypertrophic cardiomyopathy: Secondary | ICD-10-CM | POA: Diagnosis not present

## 2023-08-25 LAB — CUP PACEART REMOTE DEVICE CHECK
Battery Remaining Longevity: 168 mo
Battery Remaining Percentage: 100 %
Brady Statistic RV Percent Paced: 1 %
Date Time Interrogation Session: 20241022045100
HighPow Impedance: 71 Ohm
Implantable Lead Connection Status: 753985
Implantable Lead Implant Date: 20230424
Implantable Lead Location: 753860
Implantable Lead Model: 137
Implantable Lead Serial Number: 301474
Implantable Pulse Generator Implant Date: 20230424
Lead Channel Impedance Value: 451 Ohm
Lead Channel Pacing Threshold Amplitude: 0.7 V
Lead Channel Pacing Threshold Pulse Width: 0.4 ms
Lead Channel Setting Pacing Amplitude: 2.5 V
Lead Channel Setting Pacing Pulse Width: 0.4 ms
Lead Channel Setting Sensing Sensitivity: 0.5 mV
Pulse Gen Serial Number: 216762
Zone Setting Status: 755011

## 2023-09-08 ENCOUNTER — Ambulatory Visit: Payer: Medicare Other | Admitting: Cardiology

## 2023-09-12 NOTE — Progress Notes (Signed)
Remote ICD transmission.   

## 2023-11-20 NOTE — Progress Notes (Deleted)
Cardiology Office Note:    Date:  11/20/2023   ID:  Bryan Crane, DOB 12-10-1945, MRN [id number]  PCP:  Majel Homer, MD  Cardiologist:  Little Ishikawa, MD  Electrophysiologist:  None   Referring MD: Majel Homer, MD   No chief complaint on file.   History of Present Illness:    Bryan Crane is a 78 y.o. male with a hx of hypertrophic cardiomyopathy, CKD stage III, OSA, hypertension, CVA who presents for follow-up.  He was referred by Dr. Janeece Fitting for evaluation of hypertrophic cardiomyopathy, he was admitted 04/03/2021 with acute CVA.  Echocardiogram showed EF 60 to 65%, normal diastolic function, normal RV function, no significant valvular disease.  Loop recorder was placed for work-up of cryptogenic stroke.    Reports he was diagnosed with hypertrophic cardiomyopathy at age 42.  Has followed with Dr.Gooray in [place], [place] since that time.  He now lives in [place].  He denies any chest pain or lower extremity edema.  Does report occasional dyspnea.  States that before his stroke though he was exercising by walking or riding bike for 1 hour daily, denies any exertional symptoms.  Reports intermittent lightheadedness, denies any syncope.  Does report episodes of palpitations where he feels like heart is racing since his stroke.  No smoking history.  He thinks his maternal grandfather had HCM and had PPM placed.  Brother has PPM.  Cardiac MRI on 07/28/2021 showed asymmetric LV hypertrophy measuring up to 24 mm in mid septum, consistent with hypertrophic cardiomyopathy reverse septal curvature subtype.  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  He was started on Eliquis given LV apical aneurysm and recent cryptogenic stroke.  Underwent ICD placement 02/22/2022  Since last clinic visit,  he reports he is doing okay.  He is doing physical  therapy for some balance issues.  Denies any chest pain, dyspnea, lightheadedness, syncope, lower extremity edema, or palpitations.  Denies any bleeding on Eliquis.  Rides bike 5 times per week for about 30 minutes.   Past Medical History:  Diagnosis Date   Arthritis    Cardiomyopathy (HCC)    CKD (chronic kidney disease)    Dyspnea    with exertion   Enlarged prostate    GERD (gastroesophageal reflux disease)    History of kidney stones    Hypertension    Sleep apnea    uses cpap    Past Surgical History:  Procedure Laterality Date   CARDIAC CATHETERIZATION     "years ago" - states it was clear   COLONOSCOPY     ICD IMPLANT N/A 02/22/2022   Procedure: ICD IMPLANT;  Surgeon: Marinus Maw, MD;  Location: [facility] INVASIVE CV LAB;  Service: Cardiovascular;  Laterality: N/A;   KIDNEY STONE SURGERY     LOOP RECORDER INSERTION N/A 04/06/2021   Procedure: LOOP RECORDER INSERTION;  Surgeon: Marinus Maw, MD;  Location: [facility] INVASIVE CV LAB;  Service: Cardiovascular;  Laterality: N/A;   ORIF ELBOW FRACTURE Left 09/30/2018   Procedure: Open reduction internal fixation left elbow fracture with repair reconstruction and  allograft;  Surgeon: Dominica Severin, MD;  Location: [facility] OR;  Service: Orthopedics;  Laterality: Left;  90 mins   TONSILLECTOMY      Current Medications: No outpatient medications have been marked as taking for the 11/24/23 encounter (Appointment) with Little Ishikawa, MD.  Allergies:   Patient has no known allergies.   Social History   Socioeconomic History   Marital status: Married    Spouse name: Not on file   Number of children: Not on file   Years of education: Not on file   Highest education level: Not on file  Occupational History   Not on file  Tobacco Use   Smoking status: Never   Smokeless tobacco: Never  Vaping Use   Vaping status: Never Used  Substance and Sexual Activity   Alcohol use: No   Drug use: No   Sexual activity: Not on file   Other Topics Concern   Not on file  Social History Narrative   Left handed    Caffeine- occasional use   Lives at home with his wife    Social Drivers of Corporate investment banker Strain: Not on file  Food Insecurity: Not on file  Transportation Needs: Not on file  Physical Activity: Not on file  Stress: Not on file  Social Connections: Unknown (03/06/2022)   [facility]   Social Network    Social Network: Not on file     Family History: He thinks his maternal grandfather had HCM and had PPM placed.  Brother has PPM.  ROS:   Please see the history of present illness.     All other systems reviewed and are negative.  EKGs/Labs/Other Studies Reviewed:    The following studies were reviewed today:   EKG:   02/03/22: Sinus rhythm first-degree AV block, right bundle branch block, Q waves in I, aVL, V4-6 03/03/23: Sinus rhythm with first-degree AV block, right bundle branch block, Q waves in leads I, aVL, V4-6  Recent Labs: No results found for requested labs within last 365 days.  Recent Lipid Panel    Component Value Date/Time   CHOL 81 (L) 06/16/2021 1140   TRIG 63 06/16/2021 1140   HDL 38 (L) 06/16/2021 1140   CHOLHDL 2.1 06/16/2021 1140   CHOLHDL 4.7 04/04/2021 0649   VLDL 33 04/04/2021 0649   LDLCALC 28 06/16/2021 1140    Physical Exam:    VS:  There were no vitals taken for this visit.    Wt Readings from Last 3 Encounters:  03/03/23 201 lb 3.2 oz (91.3 kg)  09/02/22 197 lb (89.4 kg)  06/01/22 196 lb (88.9 kg)     GEN:  Well nourished, well developed in no acute distress HEENT: Normal NECK: No JVD; No carotid bruits CARDIAC: RRR, no murmurs, rubs, gallops RESPIRATORY:  Clear to auscultation without rales, wheezing or rhonchi  ABDOMEN: Soft, non-tender, non-distended MUSCULOSKELETAL:  No edema; No deformity  SKIN: Warm and dry NEUROLOGIC:  Alert and oriented x 3 PSYCHIATRIC:  Normal affect   ASSESSMENT:    No  diagnosis found.    PLAN:    Hypertrophic cardiomyopathy: Diagnosed age 32, followed with cardiologist in [place].  Cardiac MRI on 07/28/2021 showed asymmetric LV hypertrophy measuring up to 24 mm in mid septum, consistent with hypertrophic cardiomyopathy reverse septal curvature subtype.  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  Underwent ICD placement 02/22/2022 -While no LV thrombus seen on MRI, he is at high risk for thrombus given LV apical aneurysm.  Given cryptogenic stroke 04/2021, concerning that the apical aneurysm is the source of his stroke and started Eliquis 5 mg twice daily.  Continue Eliquis -Follows with EP for ICD management -Update echocardiogram  CVA: Diagnosed June 2022.  Loop recorder placed.  Given LV apical aneurysm, concern for LV thrombus as source.  Started Eliquis as above.  Continue atorvastatin  Hypertension: on perindopril 16 mg daily.  BP mildly elevated in clinic today, recommend checking BP twice daily for next week and calling with results  Hyperlipidemia: Continue atorvastatin 20 mg daily.  LDL 28 on 06/16/2021  CKD stage 3B: Creatinine 1.78 on 01/19/23   RTC in 6 months***   Medication Adjustments/Labs and Tests Ordered: Current medicines are reviewed at length with the patient today.  Concerns regarding medicines are outlined above.  No orders of the defined types were placed in this encounter.    No orders of the defined types were placed in this encounter.    There are no Patient Instructions on file for this visit.   Signed, Little Ishikawa, MD  11/20/2023 3:42 PM    [facility]

## 2023-11-22 ENCOUNTER — Ambulatory Visit (INDEPENDENT_AMBULATORY_CARE_PROVIDER_SITE_OTHER): Payer: Medicare Other

## 2023-11-22 DIAGNOSIS — I422 Other hypertrophic cardiomyopathy: Secondary | ICD-10-CM

## 2023-11-22 LAB — CUP PACEART REMOTE DEVICE CHECK
Battery Remaining Longevity: 168 mo
Battery Remaining Percentage: 100 %
Brady Statistic RV Percent Paced: 1 %
Date Time Interrogation Session: 20250121045100
HighPow Impedance: 81 Ohm
Implantable Lead Connection Status: 753985
Implantable Lead Implant Date: 20230424
Implantable Lead Location: 753860
Implantable Lead Model: 137
Implantable Lead Serial Number: 301474
Implantable Pulse Generator Implant Date: 20230424
Lead Channel Impedance Value: 471 Ohm
Lead Channel Pacing Threshold Amplitude: 0.7 V
Lead Channel Pacing Threshold Pulse Width: 0.4 ms
Lead Channel Setting Pacing Amplitude: 2.5 V
Lead Channel Setting Pacing Pulse Width: 0.4 ms
Lead Channel Setting Sensing Sensitivity: 0.5 mV
Pulse Gen Serial Number: 216762
Zone Setting Status: 755011

## 2023-11-24 ENCOUNTER — Ambulatory Visit: Payer: 59 | Admitting: Cardiology

## 2023-11-25 ENCOUNTER — Encounter: Payer: Self-pay | Admitting: Internal Medicine

## 2023-12-29 ENCOUNTER — Ambulatory Visit: Payer: 59 | Admitting: Cardiology

## 2024-01-04 NOTE — Progress Notes (Signed)
 Remote ICD transmission.

## 2024-01-20 ENCOUNTER — Ambulatory Visit: Payer: 59 | Admitting: Cardiology

## 2024-02-04 ENCOUNTER — Other Ambulatory Visit: Payer: Self-pay | Admitting: Cardiology

## 2024-02-21 ENCOUNTER — Ambulatory Visit (INDEPENDENT_AMBULATORY_CARE_PROVIDER_SITE_OTHER): Payer: Medicare Other

## 2024-02-21 DIAGNOSIS — I422 Other hypertrophic cardiomyopathy: Secondary | ICD-10-CM | POA: Diagnosis not present

## 2024-02-22 LAB — CUP PACEART REMOTE DEVICE CHECK
Battery Remaining Longevity: 162 mo
Battery Remaining Percentage: 100 %
Brady Statistic RV Percent Paced: 1 %
Date Time Interrogation Session: 20250422045200
HighPow Impedance: 78 Ohm
Implantable Lead Connection Status: 753985
Implantable Lead Implant Date: 20230424
Implantable Lead Location: 753860
Implantable Lead Model: 137
Implantable Lead Serial Number: 301474
Implantable Pulse Generator Implant Date: 20230424
Lead Channel Impedance Value: 452 Ohm
Lead Channel Pacing Threshold Amplitude: 0.7 V
Lead Channel Pacing Threshold Pulse Width: 0.4 ms
Lead Channel Setting Pacing Amplitude: 2.5 V
Lead Channel Setting Pacing Pulse Width: 0.4 ms
Lead Channel Setting Sensing Sensitivity: 0.5 mV
Pulse Gen Serial Number: 216762
Zone Setting Status: 755011

## 2024-02-23 ENCOUNTER — Encounter: Payer: Self-pay | Admitting: Internal Medicine

## 2024-04-01 LAB — LAB REPORT - SCANNED
Creatinine, POC: 76.1 mg/dL
EGFR: 28

## 2024-04-05 NOTE — Addendum Note (Signed)
 Addended by: Lott Rouleau A on: 04/05/2024 11:34 AM   Modules accepted: Orders

## 2024-04-05 NOTE — Progress Notes (Signed)
 Remote ICD transmission.

## 2024-05-22 ENCOUNTER — Ambulatory Visit (INDEPENDENT_AMBULATORY_CARE_PROVIDER_SITE_OTHER): Payer: Medicare Other

## 2024-05-22 DIAGNOSIS — I422 Other hypertrophic cardiomyopathy: Secondary | ICD-10-CM | POA: Diagnosis not present

## 2024-05-23 LAB — CUP PACEART REMOTE DEVICE CHECK
Battery Remaining Longevity: 162 mo
Battery Remaining Percentage: 100 %
Brady Statistic RV Percent Paced: 1 %
Date Time Interrogation Session: 20250722051300
HighPow Impedance: 76 Ohm
Implantable Lead Connection Status: 753985
Implantable Lead Implant Date: 20230424
Implantable Lead Location: 753860
Implantable Lead Model: 137
Implantable Lead Serial Number: 301474
Implantable Pulse Generator Implant Date: 20230424
Lead Channel Impedance Value: 453 Ohm
Lead Channel Pacing Threshold Amplitude: 0.8 V
Lead Channel Pacing Threshold Pulse Width: 0.4 ms
Lead Channel Setting Pacing Amplitude: 2.5 V
Lead Channel Setting Pacing Pulse Width: 0.4 ms
Lead Channel Setting Sensing Sensitivity: 0.5 mV
Pulse Gen Serial Number: 216762
Zone Setting Status: 755011

## 2024-05-27 ENCOUNTER — Ambulatory Visit: Payer: Self-pay | Admitting: Internal Medicine

## 2024-05-29 ENCOUNTER — Other Ambulatory Visit: Payer: Self-pay

## 2024-05-29 MED ORDER — ATORVASTATIN CALCIUM 20 MG PO TABS: 20.0000 mg | ORAL_TABLET | Freq: Every day | ORAL | 0 refills | Status: DC

## 2024-06-12 ENCOUNTER — Other Ambulatory Visit: Payer: Self-pay | Admitting: Cardiology

## 2024-08-03 NOTE — Progress Notes (Signed)
 Remote ICD Transmission

## 2024-08-11 ENCOUNTER — Other Ambulatory Visit: Payer: Self-pay | Admitting: Cardiology

## 2024-08-21 ENCOUNTER — Ambulatory Visit (INDEPENDENT_AMBULATORY_CARE_PROVIDER_SITE_OTHER): Payer: Medicare Other

## 2024-08-21 DIAGNOSIS — I422 Other hypertrophic cardiomyopathy: Secondary | ICD-10-CM | POA: Diagnosis not present

## 2024-08-23 ENCOUNTER — Ambulatory Visit: Payer: Self-pay | Admitting: Internal Medicine

## 2024-08-23 LAB — CUP PACEART REMOTE DEVICE CHECK
Battery Remaining Longevity: 156 mo
Battery Remaining Percentage: 100 %
Brady Statistic RV Percent Paced: 1 %
Date Time Interrogation Session: 20251021045100
HighPow Impedance: 76 Ohm
Implantable Lead Connection Status: 753985
Implantable Lead Implant Date: 20230424
Implantable Lead Location: 753860
Implantable Lead Model: 137
Implantable Lead Serial Number: 301474
Implantable Pulse Generator Implant Date: 20230424
Lead Channel Impedance Value: 441 Ohm
Lead Channel Pacing Threshold Amplitude: 0.8 V
Lead Channel Pacing Threshold Pulse Width: 0.4 ms
Lead Channel Setting Pacing Amplitude: 2.5 V
Lead Channel Setting Pacing Pulse Width: 0.4 ms
Lead Channel Setting Sensing Sensitivity: 0.5 mV
Pulse Gen Serial Number: 216762
Zone Setting Status: 755011

## 2024-08-24 NOTE — Progress Notes (Signed)
 Remote ICD Transmission

## 2024-10-22 ENCOUNTER — Telehealth: Payer: Self-pay | Admitting: Cardiology

## 2024-10-22 NOTE — Telephone Encounter (Signed)
" ° °  Name: Bryan Crane  DOB: 1945/11/24  MRN: [id number]  Primary Cardiologist: Lonni LITTIE Nanas, MD  Callback - please obtain details of requested preop clearance.   However, he has not been seen in over a year so will also require OV. (Last visit 03/2023)  Chart reviewed as part of pre-operative protocol coverage. Because of [name] past medical history and time since last visit, he will require a follow-up in-office visit in order to better assess preoperative cardiovascular risk.  Pre-op covering staff: - Please schedule appointment and call patient to inform them. If patient already had an upcoming appointment within acceptable timeframe, please add pre-op clearance to the appointment notes so provider is aware. - Please contact requesting surgeon's office via preferred method (i.e, phone, fax) to inform them of need for appointment prior to surgery.  Cataract does not typically require holding of OAC but surgical details pending.   Keyton Bhat N Arminda Foglio, PA-C  10/22/2024, 12:03 PM   "

## 2024-10-22 NOTE — Telephone Encounter (Signed)
 OV preop clearance appt has now been scheduled

## 2024-10-22 NOTE — Telephone Encounter (Signed)
 Pt was told by his Primary physician to call in requesting a Pre op clearance due to a Cataract surgery scheduled on December 05, 2024  by Dr Cathlene Bring. Please advise

## 2024-10-22 NOTE — Telephone Encounter (Signed)
 Spoke with the patient, he will be bringing the office's preop clearance request form to the office tomorrow that has the details pertaining to the procedure.  Surgeon: Cathlene Bring M.D Surgeon's office: [facility]

## 2024-10-22 NOTE — Telephone Encounter (Signed)
 Patient also overdue for EP follow-up. Patient of Dr. Waddell, last OV 2023. Will route to EP scheduling team to arrange.

## 2024-10-23 NOTE — Telephone Encounter (Signed)
"  ° °  Patient Name: Bryan Crane  DOB: 02/26/1946 MRN: [id number]  Primary Cardiologist: Lonni LITTIE Nanas, MD  Chart reviewed as part of pre-operative protocol coverage. Cataract extractions are recognized in guidelines as low risk surgeries that do not typically require specific preoperative testing or holding of blood thinner therapy. Therefore, given past medical history and time since last visit, based on ACC/AHA guidelines, Bryan Crane would be at acceptable risk for the planned procedure without further cardiovascular testing.   I will route this recommendation to the requesting party via Epic fax function and remove from pre-op pool.  Please call with questions.  Miriam Shams, FNP-C   10/23/2024, 3:48 PM [facility] [address] Office [phone] Fax [phone]  "

## 2024-10-23 NOTE — Telephone Encounter (Signed)
 Paper Work Dropped Off: PRE OPERATIVE REQUEST  Date:10/23/2024   Location of paper: IN Dr. Kate box

## 2024-10-23 NOTE — Telephone Encounter (Signed)
 I will follow up with EP schedulers per preop APP Raphael Bring, PAC the Bryan Crane needs EP appt as well. Bryan Crane does have appt 11/13/24 Dr. Kate. Will confirm with preop APP if 11/13/24 MD appt for preop clearance. If so will be sure appts notes reflect preop clearance.

## 2024-10-23 NOTE — Telephone Encounter (Signed)
 Official preop clearance request has been received, FYI pt's OV preop clearance appt has been scheduled for 11/13/24 with Dr. Kate

## 2024-10-23 NOTE — Telephone Encounter (Signed)
"  ° °  Pre-operative Risk Assessment    Patient Name: Bryan Crane  DOB: 04-Jul-1946 MRN: [id number]   Date of last office visit: 03/03/23 Date of next office visit: 11/13/24   Request for Surgical Clearance    Procedure:  Cataract excision with intraocular implant  Date of Surgery:  Clearance 12/05/24                                Surgeon:  Dr. Cathlene Bring Surgeon's Group or Practice Name:  [facility] Phone number:  [phone] Fax number:  [phone]   Type of Clearance Requested:   - Medical  - Pharmacy:  Hold Apixaban  (Eliquis ) (not requested on form but patient is taking)   Type of Anesthesia:  Not Indicated   Additional requests/questions:    Bonney Ival LOISE Gerome   10/23/2024, 3:11 PM   "

## 2024-10-25 ENCOUNTER — Other Ambulatory Visit: Payer: Self-pay | Admitting: Cardiology

## 2024-10-26 NOTE — Telephone Encounter (Signed)
 Spoke w/ patient - he is scheduled to see Jodie Passey, PA on 1/2 for overdue 1 yr f/u + preop clearance.

## 2024-10-26 NOTE — Telephone Encounter (Signed)
 Patient's chart reflects this, will remove from the pool.

## 2024-11-02 ENCOUNTER — Ambulatory Visit: Attending: Student | Admitting: Student

## 2024-11-02 ENCOUNTER — Encounter: Payer: Self-pay | Admitting: Student

## 2024-11-02 VITALS — BP 159/80 | Ht 65.0 in | Wt 204.0 lb

## 2024-11-02 DIAGNOSIS — I129 Hypertensive chronic kidney disease with stage 1 through stage 4 chronic kidney disease, or unspecified chronic kidney disease: Secondary | ICD-10-CM | POA: Diagnosis not present

## 2024-11-02 DIAGNOSIS — I422 Other hypertrophic cardiomyopathy: Secondary | ICD-10-CM

## 2024-11-02 DIAGNOSIS — Z01818 Encounter for other preprocedural examination: Secondary | ICD-10-CM

## 2024-11-02 DIAGNOSIS — N1832 Chronic kidney disease, stage 3b: Secondary | ICD-10-CM | POA: Diagnosis not present

## 2024-11-02 DIAGNOSIS — I10 Essential (primary) hypertension: Secondary | ICD-10-CM

## 2024-11-02 LAB — CUP PACEART INCLINIC DEVICE CHECK
Date Time Interrogation Session: 20260102123815
HighPow Impedance: 61 Ohm
HighPow Impedance: 73 Ohm
Implantable Lead Connection Status: 753985
Implantable Lead Implant Date: 20230424
Implantable Lead Location: 753860
Implantable Lead Model: 137
Implantable Lead Serial Number: 301474
Implantable Pulse Generator Implant Date: 20230424
Lead Channel Impedance Value: 424 Ohm
Lead Channel Pacing Threshold Amplitude: 0.9 V
Lead Channel Pacing Threshold Pulse Width: 0.4 ms
Lead Channel Sensing Intrinsic Amplitude: 25 mV
Lead Channel Setting Pacing Amplitude: 2.5 V
Lead Channel Setting Pacing Pulse Width: 0.4 ms
Lead Channel Setting Sensing Sensitivity: 0.5 mV
Pulse Gen Serial Number: 216762
Zone Setting Status: 755011

## 2024-11-02 NOTE — Patient Instructions (Signed)
 Medication Instructions:  No medication changes today. *If you need a refill on your cardiac medications before your next appointment, please call your pharmacy*  Lab Work: No labwork ordered today. If you have labs (blood work) drawn today and your tests are completely normal, you will receive your results only by: MyChart Message (if you have MyChart) OR A paper copy in the mail If you have any lab test that is abnormal or we need to change your treatment, we will call you to review the results.  Testing/Procedures: No testing ordered today  Follow-Up: At [facility], you and your health needs are our priority.  As part of our continuing mission to provide you with exceptional heart care, our providers are all part of one team.  This team includes your primary Cardiologist (physician) and Advanced Practice Providers or APPs (Physician Assistants and Nurse Practitioners) who all work together to provide you with the care you need, when you need it.  Your next appointment:   12 month(s)  Provider:   You may see Bryan DELENA Primus, MD or one of the following Advanced Practice Providers on your designated Care Team:   Charlies Arthur, PA-C Dior Dominik Andy Stepan Verrette, PA-C Suzann Riddle, NP Daphne Barrack, NP    We recommend signing up for the patient portal called MyChart.  Sign up information is provided on this After Visit Summary.  MyChart is used to connect with patients for Virtual Visits (Telemedicine).  Patients are able to view lab/test results, encounter notes, upcoming appointments, etc.  Non-urgent messages can be sent to your provider as well.   To learn more about what you can do with MyChart, go to [email or web address].

## 2024-11-02 NOTE — Progress Notes (Signed)
" °  Electrophysiology Office Note:   ID:  Bryan, Crane [birth year], MRN [id number]  Primary Cardiologist: Bryan LITTIE Nanas, MD Electrophysiologist: Bryan DELENA Primus, MD      History of Present Illness:   Bryan Crane is a 79 y.o. male with h/o HCM s/p ICD seen today for routine electrophysiology followup.   Since last being seen in our clinic the patient reports doing very well from a cardiac perspective. No undue SOB. Otherwise, he denies chest pain, palpitations, dyspnea, PND, orthopnea, nausea, vomiting, dizziness, syncope, edema, weight gain, or early satiety.   Review of systems complete and found to be negative unless listed in HPI.   EP Information / Studies Reviewed:    EKG is ordered today. Personal review as below.  EKG Interpretation Date/Time:  Friday November 02 2024 12:28:38 EST Ventricular Rate:  65 PR Interval:  418 QRS Duration:  144 QT Interval:  482 QTC Calculation: 501 R Axis:   172  Text Interpretation: Sinus rhythm with 1st degree A-V block with occasional Premature ventricular complexes Right bundle branch block Confirmed by Bryan Crane ([id number]) on 11/02/2024 12:30:45 PM    ICD Interrogation-  reviewed in detail today,  See PACEART report.  Arrhythmia/Device History CARELINK ILR MEDTRONIC-GT-EXPLANTED BSX ICD LATITUDE-GT   Physical Exam:   VS:  BP (!) 159/80   Ht 5' 5 (1.651 m)   Wt 204 lb (92.5 kg)   BMI 33.95 kg/m    Wt Readings from Last 3 Encounters:  11/02/24 204 lb (92.5 kg)  03/03/23 201 lb 3.2 oz (91.3 kg)  09/02/22 197 lb (89.4 kg)     GEN: No acute distress  NECK: No JVD; No carotid bruits CARDIAC: Regular rate and rhythm, no murmurs, rubs, gallops RESPIRATORY:  Clear to auscultation without rales, wheezing or rhonchi  ABDOMEN: Soft, non-tender, non-distended EXTREMITIES:  No edema; No deformity   ASSESSMENT AND PLAN:    HCM  s/p [employer] single chamber ICD  Diastolic CHF EF normal without obstruction  03/2023 and Grade 2 DD. 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  Per notes, Cataract extractions are recognized in guidelines as low risk surgeries that do not typically require specific preoperative testing or holding of blood thinner therapy. Therefore, given past medical history and time since last visit, based on ACC/AHA guidelines, Bryan Crane would be at acceptable risk for the planned procedure without further cardiovascular testing.  If holding OAC is necessary, would hold no longer than 48 hrs prior to procedure and resume as soon as safe after.  Disposition:   Follow up with EP Team in 12 months   Signed, Bryan Prentice Lesia, PA-C  "

## 2024-11-04 ENCOUNTER — Ambulatory Visit: Payer: Self-pay | Admitting: Student in an Organized Health Care Education/Training Program

## 2024-11-11 NOTE — Progress Notes (Unsigned)
 " Cardiology Office Note:    Date:  11/13/2024   ID:  TIN ENGRAM, DOB 12-19-1945, MRN [id number]  PCP:  Lawrance Handing, MD  Cardiologist:  Lonni LITTIE Nanas, MD  Electrophysiologist:  Donnice DELENA Primus, MD   Referring MD: Lawrance Handing, MD   Chief Complaint  Patient presents with   Cardiomyopathy    History of Present Illness:    Bryan Crane is a 79 y.o. male with a hx of hypertrophic cardiomyopathy, CKD stage III, OSA, hypertension, CVA who presents for follow-up.  He was referred by Dr. Marian for evaluation of hypertrophic cardiomyopathy, he was admitted 04/03/2021 with acute CVA.  Echocardiogram showed EF 60 to 65%, normal diastolic function, normal RV function, no significant valvular disease.  Loop recorder was placed for work-up of cryptogenic stroke.    Reports he was diagnosed with hypertrophic cardiomyopathy at age 34.  Has followed with Dr.Gooray in [place], [place]  since that time.  He now lives in [place] .  He denies any chest pain or lower extremity edema.  Does report occasional dyspnea.  States that before his stroke though he was exercising by walking or riding bike for 1 hour daily, denies any exertional symptoms.  Reports intermittent lightheadedness, denies any syncope.  Does report episodes of palpitations where he feels like heart is racing since his stroke.  No smoking history.  He thinks his maternal grandfather had HCM and had PPM placed.  Brother has PPM.  Cardiac MRI on 07/28/2021 showed asymmetric LV hypertrophy measuring up to 24 mm in mid septum, consistent with hypertrophic cardiomyopathy reverse septal curvature subtype.  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  He was started on Eliquis  given LV apical aneurysm and recent cryptogenic stroke.  Underwent ICD placement 02/22/2022.  Echocardiogram 03/2023 showed  EF 65 to 70%, moderate asymmetric LVH, G2 DD, no LVOT gradient, normal RV function, mild biatrial enlargement.  Since last clinic visit, he reports he is doing well. Denies any chest pain, dyspnea, lightheadedness, syncope, lower extremity edema, or palpitations.  Planning catract surgery.  Exercises at least 3 days per week, will ride stationary bike or go for walks..  Walking 5 stairs without stopping.  Denies any bleeding on Eliquis .  Past Medical History:  Diagnosis Date   Arthritis    Cardiomyopathy (HCC)    CKD (chronic kidney disease)    Dyspnea    with exertion   Enlarged prostate    GERD (gastroesophageal reflux disease)    History of kidney stones    Hypertension    Sleep apnea    uses cpap    Past Surgical History:  Procedure Laterality Date   CARDIAC CATHETERIZATION     years ago - states it was clear   COLONOSCOPY     ICD IMPLANT N/A 02/22/2022   Procedure: ICD IMPLANT;  Surgeon: Waddell Danelle ORN, MD;  Location: [facility] INVASIVE CV LAB;  Service: Cardiovascular;  Laterality: N/A;   KIDNEY STONE SURGERY     LOOP RECORDER INSERTION N/A 04/06/2021   Procedure: LOOP RECORDER INSERTION;  Surgeon: Waddell Danelle ORN, MD;  Location: [facility] INVASIVE CV LAB;  Service: Cardiovascular;  Laterality: N/A;   ORIF ELBOW FRACTURE Left 09/30/2018   Procedure: Open reduction internal fixation left elbow fracture with repair reconstruction and  allograft;  Surgeon: Camella Fallow, MD;  Location: [facility] OR;  Service: Orthopedics;  Laterality: Left;  90 mins   TONSILLECTOMY      Current Medications: Current Meds  Medication Sig   acetaminophen  (TYLENOL ) 325 MG tablet Take 2 tablets (650 mg total) by mouth every 4 (four) hours as needed for mild pain (or temp > 37.5 C (99.5 F)).   acetaminophen  (TYLENOL ) 325 MG tablet Take 325 mg by mouth as needed for moderate pain.   apixaban  (ELIQUIS ) 5 MG TABS tablet Take 1 tablet (5 mg total) by mouth 2 (two) times daily.   atorvastatin  (LIPITOR ) 20 MG tablet Take  1 tablet (20 mg total) by mouth daily.   dorzolamide -timolol  (COSOPT ) 22.3-6.8 MG/ML ophthalmic solution Place 1 drop into both eyes 2 (two) times daily.   empagliflozin (JARDIANCE) 10 MG TABS tablet Take 25 mg by mouth daily.   FARXIGA 10 MG TABS tablet Take 10 mg by mouth daily.   latanoprost  (XALATAN ) 0.005 % ophthalmic solution Place 1 drop into both eyes at bedtime.   Multiple Vitamin (MULTIVITAMIN WITH MINERALS) TABS tablet Take 1 tablet by mouth daily.   perindopril  (ACEON ) 8 MG tablet Take 2 tablets (16 mg total) by mouth daily.   Probiotic Product (PROBIOTIC DAILY PO) Take 1 capsule by mouth daily.   ranolazine  (RANEXA ) 1000 MG SR tablet Take 1 tablet (1,000 mg total) by mouth at bedtime.   tamsulosin  (FLOMAX ) 0.4 MG CAPS capsule Take 2 capsules (0.8 mg total) by mouth every evening.     Allergies:   Patient has no known allergies.   Social History   Socioeconomic History   Marital status: Married    Spouse name: Not on file   Number of children: Not on file   Years of education: Not on file   Highest education level: Not on file  Occupational History   Not on file  Tobacco Use   Smoking status: Never   Smokeless tobacco: Never  Vaping Use   Vaping status: Never Used  Substance and Sexual Activity   Alcohol use: No   Drug use: No   Sexual activity: Not on file  Other Topics Concern   Not on file  Social History Narrative   Left handed    Caffeine- occasional use   Lives at home with his wife    Social Drivers of Health   Tobacco Use: Low Risk (11/02/2024)   Patient History    Smoking Tobacco Use: Never    Smokeless Tobacco Use: Never    Passive Exposure: Not on file  Financial Resource Strain: Not on file  Food Insecurity: Not on file  Transportation Needs: Not on file  Physical Activity: Not on file  Stress: Not on file  Social Connections: Not on file  Depression (EYV7-0): Not on file  Alcohol Screen: Not on file  Housing: Not on file  Utilities: Not  on file  Health Literacy: Not on file     Family History: He thinks his maternal grandfather had HCM and had PPM placed.  Brother has PPM.  ROS:   Please see the history of present illness.     All other systems reviewed and are negative.  EKGs/Labs/Other Studies Reviewed:    The following studies were reviewed today:   EKG:   02/03/22: Sinus rhythm first-degree AV block, right bundle branch block, Q waves in I, aVL, V4-6 03/03/23: Sinus rhythm with first-degree AV block, right bundle branch block, Q waves in leads I, aVL, V4-6  Recent Labs: No results found for requested labs within last 365 days.  Recent Lipid Panel  Component Value Date/Time   CHOL 81 (L) 06/16/2021 1140   TRIG 63 06/16/2021 1140   HDL 38 (L) 06/16/2021 1140   CHOLHDL 2.1 06/16/2021 1140   CHOLHDL 4.7 04/04/2021 0649   VLDL 33 04/04/2021 0649   LDLCALC 28 06/16/2021 1140    Physical Exam:    VS:  BP 138/74 (BP Location: Left Arm, Patient Position: Sitting)   Pulse 71   Ht 5' 5 (1.651 m)   Wt 203 lb (92.1 kg)   SpO2 97%   BMI 33.78 kg/m     Wt Readings from Last 3 Encounters:  11/13/24 203 lb (92.1 kg)  11/02/24 204 lb (92.5 kg)  03/03/23 201 lb 3.2 oz (91.3 kg)     GEN:  Well nourished, well developed in no acute distress HEENT: Normal NECK: No JVD; No carotid bruits CARDIAC: RRR, no murmurs, rubs, gallops RESPIRATORY:  Clear to auscultation without rales, wheezing or rhonchi  ABDOMEN: Soft, non-tender, non-distended MUSCULOSKELETAL:  No edema; No deformity  SKIN: Warm and dry NEUROLOGIC:  Alert and oriented x 3 PSYCHIATRIC:  Normal affect   ASSESSMENT:    1. Pre-op evaluation   2. Hypertrophic cardiomyopathy (HCC)   3. Essential hypertension   4. Stage 3b chronic kidney disease (HCC)   5. Hyperlipidemia, unspecified hyperlipidemia type       PLAN:    Preop evaluation: Prior to cataract surgery.  Low risk procedure, no further cardiac workup recommended prior to procedure.   Patient denies any anginal symptoms and has good functional capacity.  He is on Eliquis  for history of LV thrombus.  If Eliquis  needs to be held for procedure per his ophthalmologist, okay to hold Eliquis  x 24 hours prior to procedure.  Hypertrophic cardiomyopathy: Diagnosed age 29, followed with cardiologist in [place].  Cardiac MRI on 07/28/2021 showed asymmetric LV hypertrophy measuring up to 24 mm in mid septum, consistent with hypertrophic cardiomyopathy reverse septal curvature subtype.  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  Underwent ICD placement 02/22/2022.  Echocardiogram 03/2023 showed EF 65 to 70%, moderate asymmetric LVH, G2 DD, no LVOT gradient, normal RV function, mild biatrial enlargement. -While no LV thrombus seen on MRI, he is at high risk for thrombus given LV apical aneurysm.  Given cryptogenic stroke 04/2021, concerning that the apical aneurysm is the source of his stroke and started Eliquis  5 mg twice daily.  Continue Eliquis  -Follows with EP for ICD management  CVA: Diagnosed June 2022.  Loop recorder placed.  Given LV apical aneurysm, concern for LV thrombus as source.  Started Eliquis  as above.  Continue atorvastatin   Hypertension: on perindopril  16 mg daily.  BP mildly elevated in clinic today, recommend checking BP twice daily for next week and calling with results  Hyperlipidemia: Continue atorvastatin  20 mg daily.  LDL 43 on 07/2024  CKD stage 3B: Creatinine 1.91 on 07/2024   RTC in 6 months   Medication Adjustments/Labs and Tests Ordered: Current medicines are reviewed at length with the patient today.  Concerns regarding medicines are outlined above.  No orders of the defined types were placed in this encounter.    No orders of the defined types were placed in this encounter.    Patient Instructions  Medication Instructions:  Your physician  recommends that you continue on your current medications as directed. Please refer to the Current Medication list given to you today.   *  If you need a refill on your cardiac medications before your next appointment, please call your pharmacy*  Lab Work: NONE If you have labs (blood work) drawn today and your tests are completely normal, you will receive your results only by: MyChart Message (if you have MyChart) OR A paper copy in the mail If you have any lab test that is abnormal or we need to change your treatment, we will call you to review the results.  Testing/Procedures: NONE  Follow-Up: At [facility], you and your health needs are our priority.  As part of our continuing mission to provide you with exceptional heart care, our providers are all part of one team.  This team includes your primary Cardiologist (physician) and Advanced Practice Providers or APPs (Physician Assistants and Nurse Practitioners) who all work together to provide you with the care you need, when you need it.  Your next appointment:   6 month(s)  Provider:   DR KATE OR One of our Advanced Practice Providers (APPs): Morse Clause, PA-C  Lamarr Satterfield, NP Miriam Shams, NP  Olivia Pavy, PA-C Josefa Beauvais, NP  Leontine Salen, PA-C Orren Fabry, PA-C  [place], PA-C Ernest Dick, NP  Damien Braver, NP Jon Hails, PA-C  Waddell Donath, PA-C    Dayna Dunn, PA-C  Scott Weaver, PA-C Lum Louis, NP Katlyn West, NP Callie Goodrich, PA-C  Xika Zhao, NP Sheng Haley, PA-C    Kathleen Johnson, PA-C   We recommend signing up for the patient portal called MyChart.  Sign up information is provided on this After Visit Summary.  MyChart is used to connect with patients for Virtual Visits (Telemedicine).  Patients are able to view lab/test results, encounter notes, upcoming appointments, etc.  Non-urgent messages can be sent to your provider as well.   To learn more about what you can do with  MyChart, go to [email or web address].   Other Instructions IF YOUR OPHTHALMOLOGIST WANTS FOR YOU TO HOLD YOUR ELIQUIS  OK TO HOLD FOR 1 DAY PRIOR TO SURGERY. IF DOES NOT NEED TO HOLD OK TO CONTINUE              Signed, Lonni LITTIE Kate, MD  11/13/2024 4:59 PM    [facility] "

## 2024-11-13 ENCOUNTER — Ambulatory Visit: Attending: Cardiology | Admitting: Cardiology

## 2024-11-13 VITALS — BP 138/74 | HR 71 | Ht 65.0 in | Wt 203.0 lb

## 2024-11-13 DIAGNOSIS — I10 Essential (primary) hypertension: Secondary | ICD-10-CM | POA: Diagnosis not present

## 2024-11-13 DIAGNOSIS — I422 Other hypertrophic cardiomyopathy: Secondary | ICD-10-CM

## 2024-11-13 DIAGNOSIS — E785 Hyperlipidemia, unspecified: Secondary | ICD-10-CM

## 2024-11-13 DIAGNOSIS — Z01818 Encounter for other preprocedural examination: Secondary | ICD-10-CM | POA: Diagnosis not present

## 2024-11-13 DIAGNOSIS — N1832 Chronic kidney disease, stage 3b: Secondary | ICD-10-CM

## 2024-11-13 NOTE — Patient Instructions (Signed)
 Medication Instructions:  Your physician recommends that you continue on your current medications as directed. Please refer to the Current Medication list given to you today.   *If you need a refill on your cardiac medications before your next appointment, please call your pharmacy*  Lab Work: NONE If you have labs (blood work) drawn today and your tests are completely normal, you will receive your results only by: MyChart Message (if you have MyChart) OR A paper copy in the mail If you have any lab test that is abnormal or we need to change your treatment, we will call you to review the results.  Testing/Procedures: NONE  Follow-Up: At [facility], you and your health needs are our priority.  As part of our continuing mission to provide you with exceptional heart care, our providers are all part of one team.  This team includes your primary Cardiologist (physician) and Advanced Practice Providers or APPs (Physician Assistants and Nurse Practitioners) who all work together to provide you with the care you need, when you need it.  Your next appointment:   6 month(s)  Provider:   DR KATE OR One of our Advanced Practice Providers (APPs): Morse Clause, PA-C  Lamarr Satterfield, NP Miriam Shams, NP  Olivia Pavy, PA-C Josefa Beauvais, NP  Leontine Salen, PA-C Orren Fabry, PA-C  [place], PA-C Ernest Dick, NP  Damien Braver, NP Jon Hails, PA-C  Waddell Donath, PA-C    Dayna Dunn, PA-C  Scott Weaver, PA-C Lum Louis, NP Katlyn West, NP Callie Goodrich, PA-C  Xika Zhao, NP Sheng Haley, PA-C    Kathleen Johnson, PA-C   We recommend signing up for the patient portal called MyChart.  Sign up information is provided on this After Visit Summary.  MyChart is used to connect with patients for Virtual Visits (Telemedicine).  Patients are able to view lab/test results, encounter notes, upcoming appointments, etc.  Non-urgent messages can be sent to your provider as well.   To  learn more about what you can do with MyChart, go to [email or web address].   Other Instructions IF YOUR OPHTHALMOLOGIST WANTS FOR YOU TO HOLD YOUR ELIQUIS  OK TO HOLD FOR 1 DAY PRIOR TO SURGERY. IF DOES NOT NEED TO HOLD OK TO CONTINUE

## 2024-11-20 ENCOUNTER — Ambulatory Visit

## 2024-11-20 DIAGNOSIS — I422 Other hypertrophic cardiomyopathy: Secondary | ICD-10-CM | POA: Diagnosis not present

## 2024-11-21 LAB — CUP PACEART REMOTE DEVICE CHECK
Battery Remaining Longevity: 150 mo
Battery Remaining Percentage: 100 %
Brady Statistic RV Percent Paced: 1 %
Date Time Interrogation Session: 20260120050200
HighPow Impedance: 74 Ohm
Implantable Lead Connection Status: 753985
Implantable Lead Implant Date: 20230424
Implantable Lead Location: 753860
Implantable Lead Model: 137
Implantable Lead Serial Number: 301474
Implantable Pulse Generator Implant Date: 20230424
Lead Channel Impedance Value: 432 Ohm
Lead Channel Pacing Threshold Amplitude: 0.9 V
Lead Channel Pacing Threshold Pulse Width: 0.4 ms
Lead Channel Setting Pacing Amplitude: 2.5 V
Lead Channel Setting Pacing Pulse Width: 0.4 ms
Lead Channel Setting Sensing Sensitivity: 0.5 mV
Pulse Gen Serial Number: 216762
Zone Setting Status: 755011

## 2024-11-23 NOTE — Progress Notes (Signed)
 Remote ICD Transmission

## 2024-12-02 ENCOUNTER — Ambulatory Visit: Payer: Self-pay | Admitting: Student in an Organized Health Care Education/Training Program

## 2025-02-19 ENCOUNTER — Ambulatory Visit

## 2025-04-25 ENCOUNTER — Ambulatory Visit: Admitting: Cardiology

## 2025-05-21 ENCOUNTER — Ambulatory Visit

## 2025-08-20 ENCOUNTER — Ambulatory Visit

## 2025-11-19 ENCOUNTER — Ambulatory Visit
